# Patient Record
Sex: Female | Born: 1935 | Race: White | Hispanic: No | Marital: Single | Smoking: Never smoker
Health system: Southern US, Community
[De-identification: ages and names within clinical notes are randomized; demographics above are authoritative.]

## PROBLEM LIST (undated history)

## (undated) DIAGNOSIS — I10 Essential (primary) hypertension: Secondary | ICD-10-CM

## (undated) DIAGNOSIS — H3530 Unspecified macular degeneration: Secondary | ICD-10-CM

## (undated) DIAGNOSIS — H35039 Hypertensive retinopathy, unspecified eye: Secondary | ICD-10-CM

## (undated) HISTORY — DX: Unspecified macular degeneration: H35.30

## (undated) HISTORY — PX: YAG LASER APPLICATION: SHX6189

## (undated) HISTORY — PX: CATARACT EXTRACTION: SUR2

## (undated) HISTORY — DX: Essential (primary) hypertension: I10

## (undated) HISTORY — DX: Hypertensive retinopathy, unspecified eye: H35.039

## (undated) HISTORY — PX: EYE SURGERY: SHX253

---

## 1998-03-13 ENCOUNTER — Inpatient Hospital Stay (HOSPITAL_COMMUNITY)
Admission: RE | Admit: 1998-03-13 | Discharge: 1998-03-18 | Payer: Self-pay | Admitting: Physical Medicine and Rehabilitation

## 1998-03-20 ENCOUNTER — Other Ambulatory Visit: Admission: RE | Admit: 1998-03-20 | Discharge: 1998-03-20 | Payer: Self-pay | Admitting: Family Medicine

## 1998-03-27 ENCOUNTER — Other Ambulatory Visit: Admission: RE | Admit: 1998-03-27 | Discharge: 1998-03-27 | Payer: Self-pay | Admitting: Family Medicine

## 1998-04-03 ENCOUNTER — Other Ambulatory Visit: Admission: RE | Admit: 1998-04-03 | Discharge: 1998-04-03 | Payer: Self-pay | Admitting: Family Medicine

## 1998-04-10 ENCOUNTER — Other Ambulatory Visit: Admission: RE | Admit: 1998-04-10 | Discharge: 1998-04-10 | Payer: Self-pay | Admitting: Family Medicine

## 1999-04-15 ENCOUNTER — Other Ambulatory Visit: Admission: RE | Admit: 1999-04-15 | Discharge: 1999-04-15 | Payer: Self-pay | Admitting: Obstetrics and Gynecology

## 1999-04-29 ENCOUNTER — Ambulatory Visit (HOSPITAL_COMMUNITY): Admission: RE | Admit: 1999-04-29 | Discharge: 1999-04-29 | Payer: Self-pay | Admitting: Gastroenterology

## 2001-03-21 ENCOUNTER — Encounter: Payer: Self-pay | Admitting: Orthopedic Surgery

## 2001-03-29 ENCOUNTER — Encounter: Payer: Self-pay | Admitting: Orthopedic Surgery

## 2001-03-29 ENCOUNTER — Inpatient Hospital Stay (HOSPITAL_COMMUNITY): Admission: RE | Admit: 2001-03-29 | Discharge: 2001-03-31 | Payer: Self-pay | Admitting: Orthopedic Surgery

## 2001-03-31 ENCOUNTER — Inpatient Hospital Stay (HOSPITAL_COMMUNITY)
Admission: RE | Admit: 2001-03-31 | Discharge: 2001-04-07 | Payer: Self-pay | Admitting: Physical Medicine & Rehabilitation

## 2001-08-05 ENCOUNTER — Emergency Department (HOSPITAL_COMMUNITY): Admission: EM | Admit: 2001-08-05 | Discharge: 2001-08-05 | Payer: Self-pay | Admitting: Emergency Medicine

## 2001-11-29 ENCOUNTER — Encounter: Admission: RE | Admit: 2001-11-29 | Discharge: 2001-11-29 | Payer: Self-pay | Admitting: Family Medicine

## 2001-11-29 ENCOUNTER — Encounter: Payer: Self-pay | Admitting: Family Medicine

## 2002-09-17 ENCOUNTER — Encounter: Payer: Self-pay | Admitting: Family Medicine

## 2002-09-17 ENCOUNTER — Encounter: Admission: RE | Admit: 2002-09-17 | Discharge: 2002-09-17 | Payer: Self-pay | Admitting: Family Medicine

## 2002-11-30 ENCOUNTER — Encounter: Admission: RE | Admit: 2002-11-30 | Discharge: 2002-11-30 | Payer: Self-pay | Admitting: Obstetrics and Gynecology

## 2002-11-30 ENCOUNTER — Encounter: Payer: Self-pay | Admitting: Family Medicine

## 2003-12-05 ENCOUNTER — Encounter: Admission: RE | Admit: 2003-12-05 | Discharge: 2003-12-05 | Payer: Self-pay | Admitting: Family Medicine

## 2004-02-18 ENCOUNTER — Encounter: Admission: RE | Admit: 2004-02-18 | Discharge: 2004-02-18 | Payer: Self-pay | Admitting: Family Medicine

## 2004-07-31 ENCOUNTER — Ambulatory Visit (HOSPITAL_COMMUNITY): Admission: RE | Admit: 2004-07-31 | Discharge: 2004-07-31 | Payer: Self-pay | Admitting: Gastroenterology

## 2019-04-16 ENCOUNTER — Encounter (INDEPENDENT_AMBULATORY_CARE_PROVIDER_SITE_OTHER): Payer: Self-pay | Admitting: Ophthalmology

## 2019-04-24 NOTE — Progress Notes (Signed)
[facility] - Clinic Note  04/25/2019     CHIEF COMPLAINT Patient presents for Retina Evaluation   HISTORY OF PRESENT ILLNESS: Alexis Cortez is a 85 y.o. female who presents to the clinic today for:   HPI    Retina Evaluation    In both eyes.  This started weeks ago.  Duration of weeks.  Context:  distance vision.  Treatments tried include no treatments.  I, the attending physician,  performed the HPI with the patient and updated documentation appropriately.          Comments    85 y/o female pt referred by [facility] [place], [place] for eval of exu ARMD.  Pt does not recall exactly when she was seen, or by which Doctor, but states it was "a few weeks ago."  Pt reports she does not know why she was originally referred by her comprehensive eye doctor, or why she was referred here.  Feels her DVA is good OU, and only wears glasses for reading.  Denies pain, flashes, floaters.  No gtts.       Last edited by Rennis ChrisZamora, Gladyse Corvin, MD on 04/25/2019  1:37 PM. (History)    pt [place], pt used to see [facility]. Allena KatzPatel at [facility] [place], she cannot remember when her last appt was, but she did she him in February, pt states she is unsure why she was still getting shots from him, she states she does not know if her vision was getting better on injections or not, she states she has had cataract sx OU, but no other eye sx  Referring physician: No referring provider defined for this encounter.  HISTORICAL INFORMATION:   Selected notes from the MEDICAL RECORD NUMBER Referred by [facility], [place], [place] for concern of exu ARMD LEE: 02.06.20 (Shil K. Patel) [BCVA: OD: 20/30+2 OS: 20/25 Ocular Hx-PCO OS, exu ARMD OD, non-exu ARMD OS, s/p YAG cap OD, s/p IVE OD 02.26.20  PMH-HTN, depression, anxiety   CURRENT MEDICATIONS: No current outpatient medications on file. (Ophthalmic Drugs)   No current facility-administered  medications for this visit.  (Ophthalmic Drugs)   Current Outpatient Medications (Other)  Medication Sig  . busPIRone (BUSPAR) 15 MG tablet Take 15 mg by mouth 2 (two) times daily.  . busPIRone (BUSPAR) 7.5 MG tablet Take by mouth.  . clonazePAM (KLONOPIN) 1 MG tablet Take 1 mg by mouth 2 (two) times daily.  . [employer] PURELAX 17 GM/SCOOP powder TAKE 17G BY MOUTH DAILY FOR 3 DAYS.  Marland Kitchen. cyanocobalamin (,VITAMIN B-12,) 1000 MCG/ML injection INJECT 1 ML TWICE A MONTH  . docusate sodium (COLACE) 100 MG capsule Take by mouth.  . esomeprazole (NEXIUM) 40 MG capsule Take by mouth.  . gabapentin (NEURONTIN) 100 MG capsule 1 CAPSULE(S) THREE TIMES A DAY  . levothyroxine (SYNTHROID) 50 MCG tablet Take by mouth.  Marland Kitchen. lisinopril (ZESTRIL) 10 MG tablet Take by mouth.  Marland Kitchen. LORazepam (ATIVAN) 0.5 MG tablet TAKE 1 TABLET BY MOUTH 30 MIN PRIOR TO MRI MAY REPEAT IN 15 MINUTES IF NEEDED  . mirtazapine (REMERON) 15 MG tablet Take by mouth.  . Misc. Devices MISC Knee high compression stockings 18-1131mmHg pressure to be worn during daytime hours and off at night Dx: peripheral edema  . Multiple Vitamin tablet Take by mouth.  . mupirocin ointment (BACTROBAN) 2 % APPLY TO AFFECTED AREA 3 TIMES A DAY  . oxyCODONE-acetaminophen (PERCOCET/ROXICET) 5-325  MG tablet Take 1 tablet by mouth every 4 (four) hours as needed. for pain  . predniSONE (DELTASONE) 50 MG tablet TAKE 1 TABLET BY MOUTH EVERY DAY FOR 5 DAYS  . pregabalin (LYRICA) 50 MG capsule Take by mouth.  . Skin Protectants, Misc. (EUCERIN) cream Apply topically.  . sulfamethoxazole-trimethoprim (BACTRIM DS) 800-160 MG tablet Take 1 tablet by mouth 2 (two) times daily. for 10 days  . traZODone (DESYREL) 100 MG tablet Take by mouth.   No current facility-administered medications for this visit.  (Other)      REVIEW OF SYSTEMS: ROS    Positive for: Eyes   Negative for: Constitutional, Gastrointestinal, Neurological, Skin, Genitourinary, Musculoskeletal, HENT,  Endocrine, Cardiovascular, Respiratory, Psychiatric, Allergic/Imm, Heme/Lymph   Last edited by Celine MansBaxley, Andrew G, COA on 04/25/2019  1:13 PM. (History)       ALLERGIES No Known Allergies  PAST MEDICAL HISTORY Past Medical History:  Diagnosis Date  . Hypertension    History reviewed. No pertinent surgical history.  FAMILY HISTORY History reviewed. No pertinent family history.  SOCIAL HISTORY Social History   Tobacco Use  . Smoking status: Not on file  Substance Use Topics  . Alcohol use: Not on file  . Drug use: Not on file         OPHTHALMIC EXAM:  Base Eye Exam    Visual Acuity (Snellen - Linear)      Right Left   Dist [place] 20/50 -2 20/40 -2   Dist ph [place] 20/40 -2 NI       Tonometry (Tonopen, 1:16 PM)      Right Left   Pressure 14 16       Pupils      Dark Light Shape React APD   Right 3 2 Round Brisk None   Left 3 2 Round Brisk None       Visual Fields (Counting fingers)      Left Right    Full Full       Extraocular Movement      Right Left    Full, Ortho Full, Ortho       Neuro/Psych    Oriented x3: Yes   Mood/Affect: Normal       Dilation    Both eyes: 1.0% Mydriacyl, 2.5% Phenylephrine @ 1:16 PM        Slit Lamp and Fundus Exam    Slit Lamp Exam      Right Left   Lids/Lashes Dermatochalasis - upper lid, mild Ptosis, mild Meibomian gland dysfunction Dermatochalasis - upper lid, mild Ptosis, mild Meibomian gland dysfunction   Conjunctiva/Sclera White and quiet mild temporal Pinguecula   Cornea Mild Arcus, trace Punctate epithelial erosions Mild Arcus, trace Punctate epithelial erosions   Anterior Chamber Deep and quiet Deep and quiet   Iris Round and dilated Round and dilated   Lens Posterior chamber intraocular lens Posterior chamber intraocular lens, 1+Posterior capsular opacification superiorly   Vitreous Vitreous syneresis Vitreous syneresis       Fundus Exam      Right Left   Disc Mild Pallor, Sharp rim, Peripapillary atrophy  Mild Pallor, Sharp rim, Peripapillary atrophy   C/D Ratio 0.4 0.4   Macula Blunted foveal reflex, central PED/CNVM, Drusen, RPE mottling, clumping and atrophy, trace cystic changes Flat, Blunted foveal reflex, Drusen, RPE mottling and clumping, No heme or edema   Vessels Vascular attenuation Vascular attenuation   Periphery Attached, No heme  Attached  Refraction    Manifest Refraction      Sphere Cylinder Axis Dist VA   Right [place] Sphere  20/50-2   Left -1.25 +0.25 165 20/40+2          IMAGING AND PROCEDURES  Imaging and Procedures for @TODAY @  OCT, Retina - OU - Both Eyes       Right Eye Quality was good. Central Foveal Thickness: 404. Progression has no prior data. Findings include abnormal foveal contour, outer retinal tubulation, intraretinal fluid, subretinal hyper-reflective material, pigment epithelial detachment, outer retinal atrophy, no SRF, retinal drusen .   Left Eye Quality was good. Central Foveal Thickness: 263. Progression has no prior data. Findings include normal foveal contour, no IRF, retinal drusen , no SRF, outer retinal atrophy (Trace ERM).   Notes *Images captured and stored on drive  Diagnosis / Impression:  exu ARMD - trace IRF overlying PED OD Non-exu ARMD OS  Clinical management:  See below  Abbreviations: NFP - Normal foveal profile. CME - cystoid macular edema. PED - pigment epithelial detachment. IRF - intraretinal fluid. SRF - subretinal fluid. EZ - ellipsoid zone. ERM - epiretinal membrane. ORA - outer retinal atrophy. ORT - outer retinal tubulation. SRHM - subretinal hyper-reflective material        Intravitreal Injection, Pharmacologic Agent - OD - Right Eye       Time Out 04/25/2019. 2:05 PM. Confirmed correct patient, procedure, site, and patient consented.   Anesthesia Topical anesthesia was used. Anesthetic medications included Lidocaine 2%, Proparacaine 0.5%.   Procedure Preparation included 5% betadine to  ocular surface, eyelid speculum. A 30 gauge needle was used.   Injection:  2 mg aflibercept Gretta Cool(EYLEA) SOLN   NDC: [id number], Lot: [id number], Expiration date: 03/16/2020   Route: Intravitreal, Site: Right Eye, Waste: 0.05 mL  Post-op Post injection exam found visual acuity of at least counting fingers. The patient tolerated the procedure well. There were no complications. The patient received written and verbal post procedure care education.                 ASSESSMENT/PLAN:    ICD-10-CM   1. Exudative age-related macular degeneration of right eye with active choroidal neovascularization (HCC)  H35.3211 Intravitreal Injection, Pharmacologic Agent - OD - Right Eye    aflibercept (EYLEA) SOLN 2 mg  2. Retinal edema  H35.81 OCT, Retina - OU - Both Eyes  3. Intermediate stage nonexudative age-related macular degeneration of left eye  H35.3122   4. Essential hypertension  I10   5. Hypertensive retinopathy of both eyes  H35.033   6. Pseudophakia of both eyes  Z96.1   7. Left posterior capsular opacification  H26.492     1,2. Exudative age related macular degeneration, right eye  - previous pt of [facility]. Caroline MoreShil Patel at [facility] in [place], [place] (last injection was IVE on 02.26.20, and was on a 7-8 injection interval) -- [place]  - The incidence pathology and anatomy of wet AMD discussed   - The ANCHOR, MARINA, CATT and VIEW trials discussed with patient.    - discussed treatment options including observation vs intravitreal anti-VEGF agents such as Avastin, Lucentis, Eylea.    - Risks of endophthalmitis and vascular occlusive events and atrophic changes discussed with patient  - OCT shows trace IRF overlying PED OD  - recommend IVE OD #1 today,  - pt wishes to proceed  - RBA of procedure discussed, questions answered  - informed consent obtained  and signed  - Eylea4U benefits investigation started on April 25, 2019 -- approved as of  today  - see procedure note  - f/u in 6 wks -- DFE/OCT/possible injection  3. Age related macular degeneration, non-exudative, both eyes  - The incidence, anatomy, and pathology of dry AMD, risk of progression, and the AREDS and AREDS 2 study including smoking risks discussed with patient.  - Recommend amsler grid monitoring  - f/u 3 months  4,5. Hypertensive retinopathy OU  - discussed importance of tight BP control  - monitor  5,6. Pseudophakia OU  - s/p CE/IOL OU  - s/p YAG cap OD  - beautiful surgeries, doing well  - OS with mild noncentral PCO  - monitor   Ophthalmic Meds Ordered this visit:  Meds ordered this encounter  Medications  . aflibercept (EYLEA) SOLN 2 mg       Return in about 6 weeks (around 06/06/2019) for f/u exu ARMD OD, DFE, OCT.  There are no Patient Instructions on file for this visit.   Explained the diagnoses, plan, and follow up with the patient and they expressed understanding.  Patient expressed understanding of the importance of proper follow up care.   This document serves as a record of services personally performed by Gardiner Sleeper, MD, PhD. It was created on their behalf by Ernest Mallick, OA, an ophthalmic assistant. The creation of this record is the provider's dictation and/or activities during the visit.    Electronically signed by: Ernest Mallick, OA  07.07.2020 4:36 PM    Gardiner Sleeper, M.D., Ph.D. Diseases & Surgery of the Retina and Vitreous [facility]   I have reviewed the above documentation for accuracy and completeness, and I agree with the above. Gardiner Sleeper, M.D., Ph.D. 04/25/19 4:39 PM     Abbreviations: M myopia (nearsighted); A astigmatism; H hyperopia (farsighted); P presbyopia; Mrx spectacle prescription;  CTL contact lenses; OD right eye; OS left eye; OU both eyes  XT exotropia; ET esotropia; PEK punctate epithelial keratitis; PEE punctate epithelial erosions; DES dry eye syndrome; MGD  meibomian gland dysfunction; ATs artificial tears; PFAT's preservative free artificial tears; [place] nuclear sclerotic cataract; PSC posterior subcapsular cataract; ERM epi-retinal membrane; PVD posterior vitreous detachment; RD retinal detachment; DM diabetes mellitus; DR diabetic retinopathy; NPDR non-proliferative diabetic retinopathy; PDR proliferative diabetic retinopathy; CSME clinically significant macular edema; DME diabetic macular edema; dbh dot blot hemorrhages; CWS cotton wool spot; POAG primary open angle glaucoma; C/D cup-to-disc ratio; HVF humphrey visual field; GVF goldmann visual field; OCT optical coherence tomography; IOP intraocular pressure; BRVO Branch retinal vein occlusion; CRVO central retinal vein occlusion; CRAO central retinal artery occlusion; BRAO branch retinal artery occlusion; RT retinal tear; SB scleral buckle; PPV pars plana vitrectomy; VH Vitreous hemorrhage; PRP panretinal laser photocoagulation; IVK intravitreal kenalog; VMT vitreomacular traction; MH Macular hole;  NVD neovascularization of the disc; NVE neovascularization elsewhere; AREDS age related eye disease study; ARMD age related macular degeneration; POAG primary open angle glaucoma; EBMD epithelial/anterior basement membrane dystrophy; ACIOL anterior chamber intraocular lens; IOL intraocular lens; PCIOL posterior chamber intraocular lens; Phaco/IOL phacoemulsification with intraocular lens placement; [place] photorefractive keratectomy; LASIK laser assisted in situ keratomileusis; HTN hypertension; DM diabetes mellitus; COPD chronic obstructive pulmonary disease

## 2019-04-25 ENCOUNTER — Encounter (INDEPENDENT_AMBULATORY_CARE_PROVIDER_SITE_OTHER): Payer: Self-pay | Admitting: Ophthalmology

## 2019-04-25 ENCOUNTER — Other Ambulatory Visit: Payer: Self-pay

## 2019-04-25 ENCOUNTER — Ambulatory Visit (INDEPENDENT_AMBULATORY_CARE_PROVIDER_SITE_OTHER): Payer: Medicare Other | Admitting: Ophthalmology

## 2019-04-25 DIAGNOSIS — H3581 Retinal edema: Secondary | ICD-10-CM

## 2019-04-25 DIAGNOSIS — H35033 Hypertensive retinopathy, bilateral: Secondary | ICD-10-CM

## 2019-04-25 DIAGNOSIS — Z961 Presence of intraocular lens: Secondary | ICD-10-CM

## 2019-04-25 DIAGNOSIS — I10 Essential (primary) hypertension: Secondary | ICD-10-CM

## 2019-04-25 DIAGNOSIS — H353211 Exudative age-related macular degeneration, right eye, with active choroidal neovascularization: Secondary | ICD-10-CM | POA: Diagnosis not present

## 2019-04-25 DIAGNOSIS — H353122 Nonexudative age-related macular degeneration, left eye, intermediate dry stage: Secondary | ICD-10-CM

## 2019-04-25 DIAGNOSIS — H26492 Other secondary cataract, left eye: Secondary | ICD-10-CM

## 2019-04-25 MED ORDER — AFLIBERCEPT 2MG/0.05ML IZ SOLN FOR KALEIDOSCOPE
2.0000 mg | INTRAVITREAL | Status: AC | PRN
  Administered 2019-04-25: 2 mg via INTRAVITREAL

## 2019-06-05 NOTE — Progress Notes (Signed)
[facility] Note  06/06/2019     CHIEF COMPLAINT Patient presents for Retina Follow Up   HISTORY OF PRESENT ILLNESS: Alexis Cortez is a 85 y.o. female who presents to the clinic today for:   HPI    Retina Follow Up    Patient presents with  Wet AMD.  In right eye.  This started 6 weeks ago.  Severity is moderate.  Duration of 6 weeks.  Since onset it is stable.  I, the attending physician,  performed the HPI with the patient and updated documentation appropriately.          Comments    85 y/o female pt here for 6 wk f/u for wet ARMD OD.  No change in [place] OU.  Denies pain, flashes, floaters.  No gtts.       Last edited by Bernarda Caffey, MD on 06/06/2019  2:56 PM. (History)    pt states she has not noticed any change in her vision, she did not have problems with the injection last time  Referring physician: No referring provider defined for this encounter.  HISTORICAL INFORMATION:   Selected notes from the MEDICAL RECORD NUMBER Referred by [place], [place] for concern of exu ARMD LEE: 02.06.20 (Alexis Cortez) [BCVA: OD: 20/30+2 OS: 20/25 Ocular Hx-PCO OS, exu ARMD OD, non-exu ARMD OS, s/p YAG cap OD, s/p IVE OD 02.26.20  PMH-HTN, depression, anxiety   CURRENT MEDICATIONS: No current outpatient medications on file. (Ophthalmic Drugs)   No current facility-administered medications for this visit.  (Ophthalmic Drugs)   Current Outpatient Medications (Other)  Medication Sig  . busPIRone (BUSPAR) 15 MG tablet Take 15 mg by mouth 2 (two) times daily.  . clonazePAM (KLONOPIN) 1 MG tablet Take 1 mg by mouth 2 (two) times daily.  . [employer] PURELAX 17 GM/SCOOP powder TAKE 17G BY MOUTH DAILY FOR 3 DAYS.  Marland Kitchen cyanocobalamin (,VITAMIN B-12,) 1000 MCG/ML injection INJECT 1 ML TWICE A MONTH  . docusate sodium (COLACE) 100 MG capsule Take by mouth.  . esomeprazole (NEXIUM) 40 MG capsule Take by mouth.  . gabapentin (NEURONTIN) 100 MG capsule 1  CAPSULE(S) THREE TIMES A DAY  . levothyroxine (SYNTHROID) 50 MCG tablet Take by mouth.  Marland Kitchen lisinopril (ZESTRIL) 10 MG tablet Take by mouth.  Marland Kitchen LORazepam (ATIVAN) 0.5 MG tablet TAKE 1 TABLET BY MOUTH 30 MIN PRIOR TO MRI MAY REPEAT IN 15 MINUTES IF NEEDED  . mirtazapine (REMERON) 15 MG tablet Take by mouth.  . Misc. Devices MISC Knee high compression stockings 18-25mmHg pressure to be worn during daytime hours and off at night Dx: peripheral edema  . Multiple Vitamin tablet Take by mouth.  . mupirocin ointment (BACTROBAN) 2 % APPLY TO AFFECTED AREA 3 TIMES A DAY  . oxyCODONE-acetaminophen (PERCOCET/ROXICET) 5-325 MG tablet Take 1 tablet by mouth every 4 (four) hours as needed. for pain  . predniSONE (DELTASONE) 50 MG tablet TAKE 1 TABLET BY MOUTH EVERY DAY FOR 5 DAYS  . pregabalin (LYRICA) 100 MG capsule TAKE 1 CAPSULE BY MOUTH THREE TIMES A DAY  . Skin Protectants, Misc. (EUCERIN) cream Apply topically.  . sulfamethoxazole-trimethoprim (BACTRIM DS) 800-160 MG tablet Take 1 tablet by mouth 2 (two) times daily. for 10 days  . traZODone (DESYREL) 100 MG tablet Take by mouth.  . busPIRone (BUSPAR) 7.5 MG tablet Take by mouth.  . pregabalin (LYRICA) 50 MG capsule Take by mouth.   No current facility-administered medications for this  visit.  (Other)      REVIEW OF SYSTEMS: ROS    Positive for: Eyes   Negative for: Constitutional, Gastrointestinal, Neurological, Skin, Genitourinary, Musculoskeletal, HENT, Endocrine, Cardiovascular, Respiratory, Psychiatric, Allergic/Imm, Heme/Lymph   Last edited by Celine MansBaxley, Andrew G, COA on 06/06/2019  1:14 PM. (History)       ALLERGIES No Known Allergies  PAST MEDICAL HISTORY Past Medical History:  Diagnosis Date  . Hypertension   . Hypertensive retinopathy    OU  . Macular degeneration    Wet OD, Dry OS   Past Surgical History:  Procedure Laterality Date  . CATARACT EXTRACTION Bilateral   . EYE SURGERY    . YAG LASER APPLICATION Right      FAMILY HISTORY History reviewed. No pertinent family history.  SOCIAL HISTORY Social History   Tobacco Use  . Smoking status: Not on file  Substance Use Topics  . Alcohol use: Not on file  . Drug use: Not on file         OPHTHALMIC EXAM:  Base Eye Exam    Visual Acuity (Snellen - Linear)      Right Left   Dist [place] 20/50 -2 20/40 -2   Dist ph [place] 20/30 -2 NI       Tonometry (Tonopen, 1:17 PM)      Right Left   Pressure 15 15       Pupils      Dark Light Shape React APD   Right 3 2 Round Brisk None   Left 3 2 Round Brisk None       Visual Fields (Counting fingers)      Left Right    Full Full       Extraocular Movement      Right Left    Full, Ortho Full, Ortho       Neuro/Psych    Oriented x3: Yes   Mood/Affect: Normal       Dilation    Both eyes: 1.0% Mydriacyl, 2.5% Phenylephrine @ 1:17 PM        Slit Lamp and Fundus Exam    Slit Lamp Exam      Right Left   Lids/Lashes Dermatochalasis - upper lid, mild Ptosis, mild Meibomian gland dysfunction Dermatochalasis - upper lid, mild Ptosis, mild Meibomian gland dysfunction   Conjunctiva/Sclera White and quiet mild temporal Pinguecula   Cornea Mild Arcus, trace Punctate epithelial erosions Mild Arcus, trace Punctate epithelial erosions   Anterior Chamber Deep and quiet Deep and quiet   Iris Round and dilated Round and dilated   Lens Posterior chamber intraocular lens Posterior chamber intraocular lens, 1+Posterior capsular opacification superiorly   Vitreous Vitreous syneresis, Posterior vitreous detachment Vitreous syneresis       Fundus Exam      Right Left   Disc Mild Pallor, Sharp rim, Peripapillary atrophy Mild Pallor, Sharp rim, Peripapillary atrophy   C/D Ratio 0.4 0.4   Macula Blunted foveal reflex, central PED/CNVM, Drusen, RPE mottling, clumping and atrophy, trace cystic changes Flat, Blunted foveal reflex, Drusen, RPE mottling and clumping, No heme or edema   Vessels Vascular  attenuation Vascular attenuation   Periphery Attached, No heme  Attached             IMAGING AND PROCEDURES  Imaging and Procedures for @TODAY @  OCT, Retina - OU - Both Eyes       Right Eye Quality was good. Central Foveal Thickness: 381. Progression has improved. Findings include abnormal foveal contour, outer retinal tubulation,  intraretinal fluid, subretinal hyper-reflective material, pigment epithelial detachment, outer retinal atrophy, no SRF, retinal drusen  (Interval improvement in cystic changes, IRF and central edema).   Left Eye Quality was good. Central Foveal Thickness: 265. Progression has been stable. Findings include normal foveal contour, no IRF, retinal drusen , no SRF, outer retinal atrophy (Trace ERM).   Notes *Images captured and stored on drive  Diagnosis / Impression:  exu ARMD - trace IRF overlying PED OD; Interval improvement in cystic changes, IRF and central edema  Non-exu ARMD OS  Clinical management:  See below  Abbreviations: NFP - Normal foveal profile. CME - cystoid macular edema. PED - pigment epithelial detachment. IRF - intraretinal fluid. SRF - subretinal fluid. EZ - ellipsoid zone. ERM - epiretinal membrane. ORA - outer retinal atrophy. ORT - outer retinal tubulation. SRHM - subretinal hyper-reflective material        Intravitreal Injection, Pharmacologic Agent - OD - Right Eye       Time Out 06/06/2019. 1:06 PM. Confirmed correct patient, procedure, site, and patient consented.   Anesthesia Topical anesthesia was used. Anesthetic medications included Lidocaine 2%, Proparacaine 0.5%.   Procedure Preparation included 5% betadine to ocular surface, eyelid speculum. A 30 gauge needle was used.   Injection:  2 mg aflibercept Gretta Cool(EYLEA) SOLN   NDC: [id number], Lot: [id number], Expiration date: 12/08/2019   Route: Intravitreal, Site: Right Eye, Waste: 0.05 mL  Post-op Post injection exam found visual acuity of at least counting fingers.  The patient tolerated the procedure well. There were no complications. The patient received written and verbal post procedure care education.                 ASSESSMENT/PLAN:    ICD-10-CM   1. Exudative age-related macular degeneration of right eye with active choroidal neovascularization (HCC)  H35.3211 Intravitreal Injection, Pharmacologic Agent - OD - Right Eye    aflibercept (EYLEA) SOLN 2 mg  2. Retinal edema  H35.81 OCT, Retina - OU - Both Eyes  3. Intermediate stage nonexudative age-related macular degeneration of left eye  H35.3122   4. Essential hypertension  I10   5. Hypertensive retinopathy of both eyes  H35.033   6. Pseudophakia of both eyes  Z96.1   7. Left posterior capsular opacification  H26.492     1,2. Exudative age related macular degeneration, right eye  - previous pt of Dr. Caroline MoreShil Cortez at [facility] in [place] (last injection was IVE on 02.26.20, and was on a 7-8 injection interval) -- [place] in [place] temporarily with family  - The incidence pathology and anatomy of wet AMD discussed   - The ANCHOR, MARINA, CATT and VIEW trials discussed with patient.    - discussed treatment options including observation vs intravitreal anti-VEGF agents such as Avastin, Lucentis, Eylea.    - Risks of endophthalmitis and vascular occlusive events and atrophic changes discussed with patient  - s/p IVA #1 (07.08.20)  - OCT shows interval improvement in trace IRF overlying PED OD  - BCVA improved to 20/30-2 from 20/40-2  - recommend IVA #2 today (08.19.20) w/ extension to 8 wks ([facility])  - pt wishes to proceed  - RBA of procedure discussed, questions answered  - informed consent obtained and signed  - see procedure note  - Eylea4U benefits investigation started on April 25, 2019 -- approved as of 7.8.2020  - f/u in 8 wks -- DFE/OCT/possible injection  3. Age related macular degeneration, non-exudative, both  eyes  - The  incidence, anatomy, and pathology of dry AMD, risk of progression, and the AREDS and AREDS 2 study including smoking risks discussed with patient.  - Recommend amsler grid monitoring  - f/u 3 months  4,5. Hypertensive retinopathy OU  - discussed importance of tight BP control  - monitor  5,6. Pseudophakia OU  - s/p CE/IOL OU  - s/p YAG cap OD  - beautiful surgeries, doing well  - OS with mild noncentral PCO  - monitor   Ophthalmic Meds Ordered this visit:  Meds ordered this encounter  Medications  . aflibercept (EYLEA) SOLN 2 mg       Return in about 8 weeks (around 08/01/2019) for f/u exu ARMD OD, DFE, OCT.  There are no Patient Instructions on file for this visit.   Explained the diagnoses, plan, and follow up with the patient and they expressed understanding.  Patient expressed understanding of the importance of proper follow up care.   This document serves as a record of services personally performed by Karie ChimeraBrian G. Zamora, MD, PhD. It was created on their behalf by Annalee Gentaaryl Barber, COMT. The creation of this record is the provider's dictation and/or activities during the visit.  Electronically signed by: Annalee Gentaaryl Barber, COMT 06/06/19 2:59 PM    Karie ChimeraBrian G. Zamora, M.D., Ph.D. Diseases & Surgery of the Retina and Vitreous [facility]  I have reviewed the above documentation for accuracy and completeness, and I agree with the above. Karie ChimeraBrian G. Zamora, M.D., Ph.D. 06/06/19 2:59 PM   Abbreviations: M myopia (nearsighted); A astigmatism; H hyperopia (farsighted); P presbyopia; Mrx spectacle prescription;  CTL contact lenses; OD right eye; OS left eye; OU both eyes  XT exotropia; ET esotropia; PEK punctate epithelial keratitis; PEE punctate epithelial erosions; DES dry eye syndrome; MGD meibomian gland dysfunction; ATs artificial tears; PFAT's preservative free artificial tears; NSC nuclear sclerotic cataract; PSC posterior subcapsular cataract; ERM epi-retinal  membrane; PVD posterior vitreous detachment; RD retinal detachment; DM diabetes mellitus; DR diabetic retinopathy; NPDR non-proliferative diabetic retinopathy; PDR proliferative diabetic retinopathy; CSME clinically significant macular edema; DME diabetic macular edema; dbh dot blot hemorrhages; CWS cotton wool spot; POAG primary open angle glaucoma; C/D cup-to-disc ratio; HVF humphrey visual field; GVF goldmann visual field; OCT optical coherence tomography; IOP intraocular pressure; BRVO Branch retinal vein occlusion; CRVO central retinal vein occlusion; CRAO central retinal artery occlusion; BRAO branch retinal artery occlusion; RT retinal tear; SB scleral buckle; PPV pars plana vitrectomy; VH Vitreous hemorrhage; PRP panretinal laser photocoagulation; IVK intravitreal kenalog; VMT vitreomacular traction; MH Macular hole;  NVD neovascularization of the disc; NVE neovascularization elsewhere; AREDS age related eye disease study; ARMD age related macular degeneration; POAG primary open angle glaucoma; EBMD epithelial/anterior basement membrane dystrophy; ACIOL anterior chamber intraocular lens; IOL intraocular lens; PCIOL posterior chamber intraocular lens; Phaco/IOL phacoemulsification with intraocular lens placement; PRK photorefractive keratectomy; LASIK laser assisted in situ keratomileusis; HTN hypertension; DM diabetes mellitus; COPD chronic obstructive pulmonary disease

## 2019-06-06 ENCOUNTER — Ambulatory Visit (INDEPENDENT_AMBULATORY_CARE_PROVIDER_SITE_OTHER): Payer: Medicare Other | Admitting: Ophthalmology

## 2019-06-06 ENCOUNTER — Other Ambulatory Visit: Payer: Self-pay

## 2019-06-06 ENCOUNTER — Encounter (INDEPENDENT_AMBULATORY_CARE_PROVIDER_SITE_OTHER): Payer: Self-pay | Admitting: Ophthalmology

## 2019-06-06 DIAGNOSIS — Z961 Presence of intraocular lens: Secondary | ICD-10-CM

## 2019-06-06 DIAGNOSIS — H3581 Retinal edema: Secondary | ICD-10-CM

## 2019-06-06 DIAGNOSIS — H353211 Exudative age-related macular degeneration, right eye, with active choroidal neovascularization: Secondary | ICD-10-CM

## 2019-06-06 DIAGNOSIS — H353122 Nonexudative age-related macular degeneration, left eye, intermediate dry stage: Secondary | ICD-10-CM | POA: Diagnosis not present

## 2019-06-06 DIAGNOSIS — H35033 Hypertensive retinopathy, bilateral: Secondary | ICD-10-CM

## 2019-06-06 DIAGNOSIS — I10 Essential (primary) hypertension: Secondary | ICD-10-CM

## 2019-06-06 DIAGNOSIS — H26492 Other secondary cataract, left eye: Secondary | ICD-10-CM

## 2019-06-06 MED ORDER — AFLIBERCEPT 2MG/0.05ML IZ SOLN FOR KALEIDOSCOPE
2.0000 mg | INTRAVITREAL | Status: AC | PRN
  Administered 2019-06-06: 2 mg via INTRAVITREAL

## 2019-07-30 NOTE — Progress Notes (Signed)
[facility] - Clinic Note  08/01/2019     CHIEF COMPLAINT Patient presents for Retina Follow Up   HISTORY OF PRESENT ILLNESS: Alexis Cortez is a 85 y.o. female who presents to the clinic today for:   HPI    Retina Follow Up    Patient presents with  Dry AMD.  In right eye.  This started 3.  Since onset it is stable.  I, the attending physician,  performed the HPI with the patient and updated documentation appropriately.          Comments    F/U EXU AMD OD. Patient states "my vision is good", denies new visual onsets/issues. Patient ready for tx today if indicted.       Last edited by Rennis ChrisZamora, Charina Fons, MD on 08/01/2019  2:38 PM. (History)    pt states her vision is about the same as last time   Referring physician: No referring provider defined for this encounter.  HISTORICAL INFORMATION:   Selected notes from the MEDICAL RECORD NUMBER Referred by [facility], [place] concern of exu ARMD LEE: 02.06.20 (Shil K. Patel) [BCVA: OD: 20/30+2 OS: 20/25 Ocular Hx-PCO OS, exu ARMD OD, non-exu ARMD OS, s/p YAG cap OD, s/p IVE OD 02.26.20  PMH-HTN, depression, anxiety   CURRENT MEDICATIONS: No current outpatient medications on file. (Ophthalmic Drugs)   No current facility-administered medications for this visit.  (Ophthalmic Drugs)   Current Outpatient Medications (Other)  Medication Sig  . busPIRone (BUSPAR) 15 MG tablet Take 15 mg by mouth 2 (two) times daily.  . busPIRone (BUSPAR) 7.5 MG tablet Take by mouth.  . clonazePAM (KLONOPIN) 1 MG tablet Take 1 mg by mouth 2 (two) times daily.  . [employer] PURELAX 17 GM/SCOOP powder TAKE 17G BY MOUTH DAILY FOR 3 DAYS.  Marland Kitchen. cyanocobalamin (,VITAMIN B-12,) 1000 MCG/ML injection INJECT 1 ML TWICE A MONTH  . docusate sodium (COLACE) 100 MG capsule Take by mouth.  . esomeprazole (NEXIUM) 40 MG capsule Take by mouth.  . gabapentin (NEURONTIN) 100 MG capsule 1 CAPSULE(S) THREE TIMES A DAY  . levothyroxine  (SYNTHROID) 50 MCG tablet Take by mouth.  Marland Kitchen. lisinopril (ZESTRIL) 10 MG tablet Take by mouth.  Marland Kitchen. LORazepam (ATIVAN) 0.5 MG tablet TAKE 1 TABLET BY MOUTH 30 MIN PRIOR TO MRI MAY REPEAT IN 15 MINUTES IF NEEDED  . mirtazapine (REMERON) 15 MG tablet Take by mouth.  . Misc. Devices MISC Knee high compression stockings 18-1631mmHg pressure to be worn during daytime hours and off at night Dx: peripheral edema  . Multiple Vitamin tablet Take by mouth.  . mupirocin ointment (BACTROBAN) 2 % APPLY TO AFFECTED AREA 3 TIMES A DAY  . oxyCODONE-acetaminophen (PERCOCET/ROXICET) 5-325 MG tablet Take 1 tablet by mouth every 4 (four) hours as needed. for pain  . predniSONE (DELTASONE) 50 MG tablet TAKE 1 TABLET BY MOUTH EVERY DAY FOR 5 DAYS  . pregabalin (LYRICA) 100 MG capsule TAKE 1 CAPSULE BY MOUTH THREE TIMES A DAY  . pregabalin (LYRICA) 50 MG capsule Take by mouth.  . Skin Protectants, Misc. (EUCERIN) cream Apply topically.  . sulfamethoxazole-trimethoprim (BACTRIM DS) 800-160 MG tablet Take 1 tablet by mouth 2 (two) times daily. for 10 days  . traZODone (DESYREL) 100 MG tablet Take by mouth.   No current facility-administered medications for this visit.  (Other)      REVIEW OF SYSTEMS: ROS    Positive for: Eyes   Negative for: Constitutional, Gastrointestinal, Neurological, Skin,  Genitourinary, Musculoskeletal, HENT, Endocrine, Cardiovascular, Respiratory, Psychiatric, Allergic/Imm, Heme/Lymph   Last edited by Zenovia Jordan, LPN on 16/04/3709  6:26 PM. (History)       ALLERGIES No Known Allergies  PAST MEDICAL HISTORY Past Medical History:  Diagnosis Date  . Hypertension   . Hypertensive retinopathy    OU  . Macular degeneration    Wet OD, Dry OS   Past Surgical History:  Procedure Laterality Date  . CATARACT EXTRACTION Bilateral   . EYE SURGERY    . YAG LASER APPLICATION Right     FAMILY HISTORY History reviewed. No pertinent family history.  SOCIAL HISTORY Social History    Tobacco Use  . Smoking status: Never Smoker  . Smokeless tobacco: Never Used  Substance Use Topics  . Alcohol use: Not on file  . Drug use: Not on file         OPHTHALMIC EXAM:  Base Eye Exam    Visual Acuity (Snellen - Linear)      Right Left   Dist [place] 20/40 +1 20/40 +2   Dist ph [place] NI 20/30 -1       Tonometry (Tonopen, 1:57 PM)      Right Left   Pressure 12 11       Pupils      Dark Light Shape React APD   Right 3 2 Round Brisk None   Left 3 2 Round Brisk None       Visual Fields (Counting fingers)      Left Right    Full Full       Extraocular Movement      Right Left    Full, Ortho Full, Ortho       Neuro/Psych    Oriented x3: Yes   Mood/Affect: Normal       Dilation    Both eyes: 1.0% Mydriacyl, 2.5% Phenylephrine @ 1:57 PM        Slit Lamp and Fundus Exam    Slit Lamp Exam      Right Left   Lids/Lashes Dermatochalasis - upper lid, mild Ptosis, mild Meibomian gland dysfunction Dermatochalasis - upper lid, mild Ptosis, mild Meibomian gland dysfunction   Conjunctiva/Sclera White and quiet mild temporal Pinguecula   Cornea Mild Arcus, trace Punctate epithelial erosions Mild Arcus, trace Punctate epithelial erosions   Anterior Chamber Deep and quiet Deep and quiet   Iris Round and dilated Round and dilated   Lens Posterior chamber intraocular lens Posterior chamber intraocular lens, 1+Posterior capsular opacification superiorly   Vitreous Vitreous syneresis, Posterior vitreous detachment Vitreous syneresis       Fundus Exam      Right Left   Disc Mild Pallor, Sharp rim, Peripapillary atrophy Mild Pallor, Sharp rim, Peripapillary atrophy   C/D Ratio 0.4 0.4   Macula Blunted foveal reflex, central PED/CNVM, Drusen, RPE mottling, clumping and atrophy, trace cystic changes Flat, Blunted foveal reflex, Drusen, RPE mottling and clumping, No heme or edema   Vessels Vascular attenuation Vascular attenuation   Periphery Attached, No heme  Attached, No  heme           IMAGING AND PROCEDURES  Imaging and Procedures for @TODAY @  OCT, Retina - OU - Both Eyes       Right Eye Quality was good. Central Foveal Thickness: 383. Progression has been stable. Findings include abnormal foveal contour, outer retinal tubulation, intraretinal fluid, subretinal hyper-reflective material, pigment epithelial detachment, outer retinal atrophy, no SRF, retinal drusen  (Persistent cystic changes).  Left Eye Quality was good. Central Foveal Thickness: 273. Progression has been stable. Findings include normal foveal contour, no IRF, retinal drusen , no SRF, outer retinal atrophy (Trace ERM).   Notes *Images captured and stored on drive  Diagnosis / Impression:  exu ARMD - trace IRF overlying PED OD; persistent cystic changes Non-exu ARMD OS  Clinical management:  See below  Abbreviations: NFP - Normal foveal profile. CME - cystoid macular edema. PED - pigment epithelial detachment. IRF - intraretinal fluid. SRF - subretinal fluid. EZ - ellipsoid zone. ERM - epiretinal membrane. ORA - outer retinal atrophy. ORT - outer retinal tubulation. SRHM - subretinal hyper-reflective material        Intravitreal Injection, Pharmacologic Agent - OD - Right Eye       Time Out 08/01/2019. 2:01 PM. Confirmed correct patient, procedure, site, and patient consented.   Anesthesia Topical anesthesia was used. Anesthetic medications included Lidocaine 2%, Proparacaine 0.5%.   Procedure Preparation included 5% betadine to ocular surface. A 30 gauge needle was used.   Injection:  2 mg aflibercept Gretta Cool) SOLN   NDC: L6038910, Lot: [id number], Expiration date: 02/05/2020   Route: Intravitreal, Site: Right Eye, Waste: 0.05 mL  Post-op Post injection exam found visual acuity of at least counting fingers. The patient tolerated the procedure well. There were no complications. The patient received written and verbal post procedure care education.                  ASSESSMENT/PLAN:    ICD-10-CM   1. Exudative age-related macular degeneration of right eye with active choroidal neovascularization (HCC)  H35.3211 Intravitreal Injection, Pharmacologic Agent - OD - Right Eye    aflibercept (EYLEA) SOLN 2 mg  2. Retinal edema  H35.81 OCT, Retina - OU - Both Eyes  3. Intermediate stage nonexudative age-related macular degeneration of left eye  H35.3122   4. Essential hypertension  I10   5. Hypertensive retinopathy of both eyes  H35.033   6. Pseudophakia of both eyes  Z96.1   7. Left posterior capsular opacification  H26.492     1,2. Exudative age related macular degeneration, right eye  - previous pt of Dr. Caroline More at [facility] in [place], [place] (last injection was IVE on 02.26.20, and was on a 7-8 injection interval) -- pt living in [place] temporarily with family  - The incidence pathology and anatomy of wet AMD discussed   - The ANCHOR, MARINA, CATT and VIEW trials discussed with patient.    - discussed treatment options including observation vs intravitreal anti-VEGF agents such as Avastin, Lucentis, Eylea.    - Risks of endophthalmitis and vascular occlusive events and atrophic changes discussed with patient  - S/P IVE #1 OD (07.08.20), #2 (08.19.20)  - OCT shows trace IRF overlying PED OD -- persistent  - recommend IVE OD #3 today (10.14.20)  - pt wishes to proceed  - RBA of procedure discussed, questions answered  - informed consent obtained and signed  - Eylea4U benefits investigation started on April 25, 2019 -- approved as of 7.8.20  - see procedure note  - f/u in 7 wks -- DFE/OCT/possible injection  3. Age related macular degeneration, non-exudative, both eyes  - The incidence, anatomy, and pathology of dry AMD, risk of progression, and the AREDS and AREDS 2 study including smoking risks discussed with patient.  - Recommend amsler grid monitoring  - f/u 3 months  4,5. Hypertensive retinopathy OU  - discussed  importance of tight BP  control  - monitor  5,6. Pseudophakia OU  - s/p CE/IOL OU  - s/p YAG cap OD  - beautiful surgeries, doing well  - OS with mild noncentral PCO  - monitor   Ophthalmic Meds Ordered this visit:  Meds ordered this encounter  Medications  . aflibercept (EYLEA) SOLN 2 mg       Return in about 7 weeks (around 09/19/2019) for f/u exu ARMD OD, DFE, OCT.  There are no Patient Instructions on file for this visit.   Explained the diagnoses, plan, and follow up with the patient and they expressed understanding.  Patient expressed understanding of the importance of proper follow up care.   This document serves as a record of services personally performed by Karie Chimera, MD, PhD. It was created on their behalf by Annalee Genta, COMT. The creation of this record is the provider's dictation and/or activities during the visit.  Electronically signed by: Annalee Genta, COMT 08/01/19 10:40 PM   Karie Chimera, M.D., Ph.D. Diseases & Surgery of the Retina and Vitreous [facility] 08/01/19  I have reviewed the above documentation for accuracy and completeness, and I agree with the above. Karie Chimera, M.D., Ph.D. 08/01/19 10:43 PM   Abbreviations: M myopia (nearsighted); A astigmatism; H hyperopia (farsighted); P presbyopia; Mrx spectacle prescription;  CTL contact lenses; OD right eye; OS left eye; OU both eyes  XT exotropia; ET esotropia; PEK punctate epithelial keratitis; PEE punctate epithelial erosions; DES dry eye syndrome; MGD meibomian gland dysfunction; ATs artificial tears; PFAT's preservative free artificial tears; NSC nuclear sclerotic cataract; PSC posterior subcapsular cataract; ERM epi-retinal membrane; PVD posterior vitreous detachment; RD retinal detachment; DM diabetes mellitus; DR diabetic retinopathy; NPDR non-proliferative diabetic retinopathy; PDR proliferative diabetic retinopathy; CSME clinically significant macular edema;  DME diabetic macular edema; dbh dot blot hemorrhages; CWS cotton wool spot; POAG primary open angle glaucoma; C/D cup-to-disc ratio; HVF humphrey visual field; GVF goldmann visual field; OCT optical coherence tomography; IOP intraocular pressure; BRVO Branch retinal vein occlusion; CRVO central retinal vein occlusion; CRAO central retinal artery occlusion; BRAO branch retinal artery occlusion; RT retinal tear; SB scleral buckle; PPV pars plana vitrectomy; VH Vitreous hemorrhage; PRP panretinal laser photocoagulation; IVK intravitreal kenalog; VMT vitreomacular traction; MH Macular hole;  NVD neovascularization of the disc; NVE neovascularization elsewhere; AREDS age related eye disease study; ARMD age related macular degeneration; POAG primary open angle glaucoma; EBMD epithelial/anterior basement membrane dystrophy; ACIOL anterior chamber intraocular lens; IOL intraocular lens; PCIOL posterior chamber intraocular lens; Phaco/IOL phacoemulsification with intraocular lens placement; PRK photorefractive keratectomy; LASIK laser assisted in situ keratomileusis; HTN hypertension; DM diabetes mellitus; COPD chronic obstructive pulmonary disease

## 2019-08-01 ENCOUNTER — Encounter (INDEPENDENT_AMBULATORY_CARE_PROVIDER_SITE_OTHER): Payer: Self-pay | Admitting: Ophthalmology

## 2019-08-01 ENCOUNTER — Other Ambulatory Visit: Payer: Self-pay

## 2019-08-01 ENCOUNTER — Ambulatory Visit (INDEPENDENT_AMBULATORY_CARE_PROVIDER_SITE_OTHER): Payer: Medicare Other | Admitting: Ophthalmology

## 2019-08-01 DIAGNOSIS — H3581 Retinal edema: Secondary | ICD-10-CM | POA: Diagnosis not present

## 2019-08-01 DIAGNOSIS — H353211 Exudative age-related macular degeneration, right eye, with active choroidal neovascularization: Secondary | ICD-10-CM

## 2019-08-01 DIAGNOSIS — I10 Essential (primary) hypertension: Secondary | ICD-10-CM | POA: Diagnosis not present

## 2019-08-01 DIAGNOSIS — H35033 Hypertensive retinopathy, bilateral: Secondary | ICD-10-CM

## 2019-08-01 DIAGNOSIS — H353122 Nonexudative age-related macular degeneration, left eye, intermediate dry stage: Secondary | ICD-10-CM

## 2019-08-01 DIAGNOSIS — Z961 Presence of intraocular lens: Secondary | ICD-10-CM

## 2019-08-01 DIAGNOSIS — H26492 Other secondary cataract, left eye: Secondary | ICD-10-CM

## 2019-08-01 MED ORDER — AFLIBERCEPT 2MG/0.05ML IZ SOLN FOR KALEIDOSCOPE
2.0000 mg | INTRAVITREAL | Status: AC | PRN
  Administered 2019-08-01: 15:00:00 2 mg via INTRAVITREAL

## 2019-09-19 ENCOUNTER — Encounter (INDEPENDENT_AMBULATORY_CARE_PROVIDER_SITE_OTHER): Payer: Medicare Other | Admitting: Ophthalmology

## 2019-10-05 ENCOUNTER — Encounter (INDEPENDENT_AMBULATORY_CARE_PROVIDER_SITE_OTHER): Payer: Medicare Other | Admitting: Ophthalmology

## 2019-10-05 ENCOUNTER — Encounter (HOSPITAL_COMMUNITY): Payer: Self-pay | Admitting: *Deleted

## 2019-10-05 ENCOUNTER — Emergency Department (HOSPITAL_COMMUNITY): Payer: Medicare Other

## 2019-10-05 ENCOUNTER — Inpatient Hospital Stay (HOSPITAL_COMMUNITY)
Admission: EM | Admit: 2019-10-05 | Discharge: 2019-10-10 | DRG: 177 | Disposition: A | Discharge status: Skilled Nursing Facility | Payer: Medicare Other | Attending: Internal Medicine | Admitting: Internal Medicine

## 2019-10-05 ENCOUNTER — Other Ambulatory Visit: Payer: Self-pay

## 2019-10-05 DIAGNOSIS — H353211 Exudative age-related macular degeneration, right eye, with active choroidal neovascularization: Secondary | ICD-10-CM

## 2019-10-05 DIAGNOSIS — J1289 Other viral pneumonia: Secondary | ICD-10-CM | POA: Diagnosis present

## 2019-10-05 DIAGNOSIS — R5381 Other malaise: Secondary | ICD-10-CM | POA: Diagnosis present

## 2019-10-05 DIAGNOSIS — I10 Essential (primary) hypertension: Secondary | ICD-10-CM | POA: Diagnosis present

## 2019-10-05 DIAGNOSIS — Z6827 Body mass index (BMI) 27.0-27.9, adult: Secondary | ICD-10-CM | POA: Diagnosis not present

## 2019-10-05 DIAGNOSIS — Z79899 Other long term (current) drug therapy: Secondary | ICD-10-CM | POA: Diagnosis not present

## 2019-10-05 DIAGNOSIS — Z7409 Other reduced mobility: Secondary | ICD-10-CM | POA: Diagnosis present

## 2019-10-05 DIAGNOSIS — R001 Bradycardia, unspecified: Secondary | ICD-10-CM

## 2019-10-05 DIAGNOSIS — H353122 Nonexudative age-related macular degeneration, left eye, intermediate dry stage: Secondary | ICD-10-CM

## 2019-10-05 DIAGNOSIS — J9601 Acute respiratory failure with hypoxia: Secondary | ICD-10-CM | POA: Diagnosis present

## 2019-10-05 DIAGNOSIS — E039 Hypothyroidism, unspecified: Secondary | ICD-10-CM | POA: Diagnosis present

## 2019-10-05 DIAGNOSIS — U071 COVID-19: Secondary | ICD-10-CM | POA: Diagnosis not present

## 2019-10-05 DIAGNOSIS — H26492 Other secondary cataract, left eye: Secondary | ICD-10-CM

## 2019-10-05 DIAGNOSIS — H3581 Retinal edema: Secondary | ICD-10-CM

## 2019-10-05 DIAGNOSIS — E669 Obesity, unspecified: Secondary | ICD-10-CM | POA: Diagnosis present

## 2019-10-05 DIAGNOSIS — H35033 Hypertensive retinopathy, bilateral: Secondary | ICD-10-CM

## 2019-10-05 DIAGNOSIS — Z961 Presence of intraocular lens: Secondary | ICD-10-CM

## 2019-10-05 LAB — CBC WITH DIFFERENTIAL/PLATELET
Abs Immature Granulocytes: 0.12 10*3/uL — ABNORMAL HIGH (ref 0.00–0.07)
Basophils Absolute: 0 10*3/uL (ref 0.0–0.1)
Basophils Relative: 0 %
Eosinophils Absolute: 0.1 10*3/uL (ref 0.0–0.5)
Eosinophils Relative: 1 %
HCT: 38.2 % (ref 36.0–46.0)
Hemoglobin: 12 g/dL (ref 12.0–15.0)
Immature Granulocytes: 1 %
Lymphocytes Relative: 9 %
Lymphs Abs: 0.8 10*3/uL (ref 0.7–4.0)
MCH: 28.3 pg (ref 26.0–34.0)
MCHC: 31.4 g/dL (ref 30.0–36.0)
MCV: 90.1 fL (ref 80.0–100.0)
Monocytes Absolute: 0.8 10*3/uL (ref 0.1–1.0)
Monocytes Relative: 9 %
Neutro Abs: 7.4 10*3/uL (ref 1.7–7.7)
Neutrophils Relative %: 80 %
Platelets: 332 10*3/uL (ref 150–400)
RBC: 4.24 MIL/uL (ref 3.87–5.11)
RDW: 14.3 % (ref 11.5–15.5)
WBC: 9.1 10*3/uL (ref 4.0–10.5)
nRBC: 0 % (ref 0.0–0.2)

## 2019-10-05 LAB — COMPREHENSIVE METABOLIC PANEL
ALT: 34 U/L (ref 0–44)
AST: 36 U/L (ref 15–41)
Albumin: 3 g/dL — ABNORMAL LOW (ref 3.5–5.0)
Alkaline Phosphatase: 74 U/L (ref 38–126)
Anion gap: 11 (ref 5–15)
BUN: 36 mg/dL — ABNORMAL HIGH (ref 8–23)
CO2: 21 mmol/L — ABNORMAL LOW (ref 22–32)
Calcium: 8.3 mg/dL — ABNORMAL LOW (ref 8.9–10.3)
Chloride: 108 mmol/L (ref 98–111)
Creatinine, Ser: 0.79 mg/dL (ref 0.44–1.00)
GFR calc Af Amer: >60 mL/min (ref >60)
GFR calc non Af Amer: >60 mL/min (ref >60)
Glucose, Bld: 96 mg/dL (ref 70–99)
Potassium: 3.5 mmol/L (ref 3.5–5.1)
Sodium: 140 mmol/L (ref 135–145)
Total Bilirubin: 1.5 mg/dL — ABNORMAL HIGH (ref 0.3–1.2)
Total Protein: 6.8 g/dL (ref 6.5–8.1)

## 2019-10-05 LAB — LACTATE DEHYDROGENASE: LDH: 261 U/L — ABNORMAL HIGH (ref 98–192)

## 2019-10-05 LAB — POC SARS CORONAVIRUS 2 AG -  ED: SARS Coronavirus 2 Ag: NEGATIVE

## 2019-10-05 LAB — FIBRINOGEN: Fibrinogen: 739 mg/dL — ABNORMAL HIGH (ref 210–475)

## 2019-10-05 LAB — TROPONIN I (HIGH SENSITIVITY)
Troponin I (High Sensitivity): 104 ng/L
Troponin I (High Sensitivity): 71 ng/L — ABNORMAL HIGH

## 2019-10-05 LAB — FERRITIN: Ferritin: 375 ng/mL — ABNORMAL HIGH (ref 11–307)

## 2019-10-05 LAB — LACTIC ACID, PLASMA
Lactic Acid, Venous: 1.2 mmol/L (ref 0.5–1.9)
Lactic Acid, Venous: 1.3 mmol/L (ref 0.5–1.9)

## 2019-10-05 LAB — D-DIMER, QUANTITATIVE: D-Dimer, Quant: 2.12 ug{FEU}/mL — ABNORMAL HIGH (ref 0.00–0.50)

## 2019-10-05 LAB — TRIGLYCERIDES: Triglycerides: 85 mg/dL

## 2019-10-05 LAB — TSH: TSH: 1.405 u[IU]/mL (ref 0.350–4.500)

## 2019-10-05 LAB — PROCALCITONIN: Procalcitonin: <0.1 ng/mL

## 2019-10-05 LAB — C-REACTIVE PROTEIN: CRP: 7.9 mg/dL — ABNORMAL HIGH

## 2019-10-05 MED ORDER — MIRTAZAPINE 15 MG PO TABS
15.0000 mg | ORAL_TABLET | Freq: Every day | ORAL | Status: DC
  Administered 2019-10-06 – 2019-10-10 (×6): 15 mg via ORAL
  Filled 2019-10-05 (×6): qty 1

## 2019-10-05 MED ORDER — DEXAMETHASONE 4 MG PO TABS
6.0000 mg | ORAL_TABLET | ORAL | Status: DC
  Administered 2019-10-06 – 2019-10-09 (×5): 6 mg via ORAL
  Filled 2019-10-05 (×6): qty 2

## 2019-10-05 MED ORDER — LEVOTHYROXINE SODIUM 50 MCG PO TABS
75.0000 ug | ORAL_TABLET | Freq: Every day | ORAL | Status: DC
  Administered 2019-10-06 – 2019-10-10 (×5): 75 ug via ORAL
  Filled 2019-10-05 (×6): qty 1

## 2019-10-05 MED ORDER — ONDANSETRON HCL 4 MG/2ML IJ SOLN
4.0000 mg | Freq: Once | INTRAMUSCULAR | Status: AC
  Administered 2019-10-05: 4 mg via INTRAVENOUS
  Filled 2019-10-05: qty 2

## 2019-10-05 MED ORDER — DEXAMETHASONE SODIUM PHOSPHATE 10 MG/ML IJ SOLN
6.0000 mg | Freq: Once | INTRAMUSCULAR | Status: AC
  Administered 2019-10-05: 6 mg via INTRAVENOUS
  Filled 2019-10-05: qty 1

## 2019-10-05 MED ORDER — SODIUM CHLORIDE 0.9 % IV SOLN: 100.0000 mg | Freq: Every day | INTRAVENOUS | Status: DC

## 2019-10-05 MED ORDER — SODIUM CHLORIDE 0.9 % IV BOLUS
500.0000 mL | Freq: Once | INTRAVENOUS | Status: AC
  Administered 2019-10-05: 500 mL via INTRAVENOUS

## 2019-10-05 MED ORDER — PREGABALIN 50 MG PO CAPS
50.0000 mg | ORAL_CAPSULE | Freq: Every day | ORAL | Status: DC
  Administered 2019-10-06 – 2019-10-10 (×5): 50 mg via ORAL
  Filled 2019-10-05 (×5): qty 1

## 2019-10-05 MED ORDER — SODIUM CHLORIDE 0.9 % IV SOLN
1.0000 g | Freq: Once | INTRAVENOUS | Status: AC
  Administered 2019-10-05: 1 g via INTRAVENOUS
  Filled 2019-10-05: qty 10

## 2019-10-05 MED ORDER — SODIUM CHLORIDE 0.9 % IV SOLN
100.0000 mg | Freq: Every day | INTRAVENOUS | Status: AC
  Administered 2019-10-06 – 2019-10-09 (×4): 100 mg via INTRAVENOUS
  Filled 2019-10-05 (×4): qty 100

## 2019-10-05 MED ORDER — SODIUM CHLORIDE 0.9 % IV SOLN: 200.0000 mg | Freq: Once | INTRAVENOUS | Status: DC

## 2019-10-05 MED ORDER — POLYETHYLENE GLYCOL 3350 17 G PO PACK: 17.0000 g | PACK | Freq: Every day | ORAL | Status: DC | PRN

## 2019-10-05 MED ORDER — ENOXAPARIN SODIUM 40 MG/0.4ML ~~LOC~~ SOLN
40.0000 mg | Freq: Every day | SUBCUTANEOUS | Status: DC
  Administered 2019-10-06 – 2019-10-07 (×3): 40 mg via SUBCUTANEOUS
  Filled 2019-10-05 (×3): qty 0.4

## 2019-10-05 MED ORDER — ONDANSETRON HCL 4 MG PO TABS: 4.0000 mg | ORAL_TABLET | Freq: Four times a day (QID) | ORAL | Status: DC | PRN

## 2019-10-05 MED ORDER — ONDANSETRON HCL 4 MG/2ML IJ SOLN
4.0000 mg | Freq: Four times a day (QID) | INTRAMUSCULAR | Status: DC | PRN
  Administered 2019-10-08: 4 mg via INTRAVENOUS
  Filled 2019-10-05: qty 2

## 2019-10-05 MED ORDER — ALBUTEROL SULFATE HFA 108 (90 BASE) MCG/ACT IN AERS: 2.0000 | INHALATION_SPRAY | Freq: Four times a day (QID) | RESPIRATORY_TRACT | Status: DC | PRN

## 2019-10-05 MED ORDER — TRAZODONE HCL 100 MG PO TABS
100.0000 mg | ORAL_TABLET | Freq: Every day | ORAL | Status: DC
  Administered 2019-10-06 – 2019-10-10 (×6): 100 mg via ORAL
  Filled 2019-10-05 (×6): qty 1

## 2019-10-05 MED ORDER — LISINOPRIL 10 MG PO TABS
10.0000 mg | ORAL_TABLET | Freq: Every day | ORAL | Status: DC
  Administered 2019-10-06 – 2019-10-10 (×5): 10 mg via ORAL
  Filled 2019-10-05 (×5): qty 1

## 2019-10-05 MED ORDER — SODIUM CHLORIDE 0.9 % IV SOLN
500.0000 mg | Freq: Once | INTRAVENOUS | Status: AC
  Administered 2019-10-05: 500 mg via INTRAVENOUS
  Filled 2019-10-05: qty 500

## 2019-10-05 MED ORDER — BUSPIRONE HCL 5 MG PO TABS
7.5000 mg | ORAL_TABLET | Freq: Two times a day (BID) | ORAL | Status: DC
  Administered 2019-10-06 – 2019-10-10 (×11): 7.5 mg via ORAL
  Filled 2019-10-05 (×11): qty 2

## 2019-10-05 MED ORDER — SODIUM CHLORIDE 0.9 % IV SOLN
200.0000 mg | Freq: Once | INTRAVENOUS | Status: AC
  Administered 2019-10-05: 200 mg via INTRAVENOUS
  Filled 2019-10-05: qty 200

## 2019-10-05 MED ORDER — SODIUM CHLORIDE 0.9 % IV SOLN: INTRAVENOUS | Status: AC

## 2019-10-05 MED ORDER — BENZONATATE 100 MG PO CAPS
100.0000 mg | ORAL_CAPSULE | Freq: Three times a day (TID) | ORAL | Status: DC
  Administered 2019-10-06 – 2019-10-10 (×16): 100 mg via ORAL
  Filled 2019-10-05 (×15): qty 1

## 2019-10-05 NOTE — ED Triage Notes (Signed)
Pt BIB EMS and coming from home.  Pt presents with chills, nausea, and decreased oral intake. Pt was 90% on RA on EMS arrival, EMS has titrated pt's O2 to 4L [place] to maintain O2 above 97%. Pt a/o x 4 and ambulatory.  Pt is dyspneic with exertion. EMS did observe any symptoms during transport. Pt reports generalized weakness/mailaise.

## 2019-10-05 NOTE — H&P (Signed)
History and Physical    Alexis Cortez Alexis Cortez:[id number] DOB: [birth year] DOA: 10/05/2019  PCP: Jettie Booze, NP  Patient coming from: Home  I have personally briefly reviewed patient's old medical records in [place]  Chief Complaint: Abdominal discomfort  HPI: Alexis Cortez is a 85 y.o. female with medical history significant of hypertension, hypothyroidism and obesity.  Patient has been at her baseline until 09/29/2019 when she was initially diagnosed with COVID-19.  In the past few days, patient has been experiencing intermittent abdominal discomfort about 5/10, aching, nonradiating with no specific relieving or aggravating factor.  There is associated anorexia, nausea and general malaise.  She denies cough, chest pains, shortness of breath, fever, diarrhea, frequency, dysuria, seizures, headache or loss of consciousness.  Based on her worsening symptoms, she came to the ED for further evaluation.  ED Course: At the ED, she had a blood pressure of 173/66, pulse of 47, respiratory 22.  Patient reportedly had episodes of desaturation and EMS had to titrate her to 4 L/min oxygen.  Her chest radiography revealed right upper lobe opacity suspicious for pneumonia and bibasilar opacities.  Review of Systems: As per HPI otherwise 10 point review of systems negative.   Past Medical History:  Diagnosis Date  . Hypertension   . Hypertensive retinopathy    OU  . Macular degeneration    Wet OD, Dry OS    Past Surgical History:  Procedure Laterality Date  . CATARACT EXTRACTION Bilateral   . EYE SURGERY    . YAG LASER APPLICATION Right      reports that she has never smoked. She has never used smokeless tobacco. She reports current alcohol use of about 2.0 standard drinks of alcohol per week. She reports that she does not use drugs.  No Known Allergies  No family history on file. Negative for COVID 19   Prior to Admission medications   Medication Sig Start Date End Date  Taking? Authorizing Provider  albuterol (VENTOLIN HFA) 108 (90 Base) MCG/ACT inhaler Inhale 2 puffs into the lungs every 6 (six) hours as needed for wheezing or shortness of breath. 08/31/19  Yes [provider]  benzonatate (TESSALON) 100 MG capsule Take 100 mg by mouth 3 (three) times daily. 10/01/19  Yes [provider]  busPIRone (BUSPAR) 7.5 MG tablet Take 7.5 mg by mouth 2 (two) times daily.  04/25/19  Yes [provider]  [employer] PURELAX 17 GM/SCOOP powder Take 17 g by mouth daily as needed for moderate constipation.  04/09/19  Yes [provider]  docusate sodium (COLACE) 100 MG capsule Take 100 mg by mouth daily as needed for mild constipation.    Yes [provider]  levothyroxine (SYNTHROID) 75 MCG tablet Take 75 mcg by mouth daily. 09/27/19  Yes [provider]  lisinopril (ZESTRIL) 10 MG tablet Take 10 mg by mouth daily.    Yes [provider]  mirtazapine (REMERON) 15 MG tablet Take 15 mg by mouth at bedtime.    Yes [provider]  Multiple Vitamin tablet Take 1 tablet by mouth daily.    Yes [provider]  pregabalin (LYRICA) 50 MG capsule Take 50 mg by mouth daily.    Yes [provider]  Skin Protectants, Misc. (EUCERIN) cream Apply 1 application topically as needed for dry skin.  04/09/19 04/08/20 Yes [provider]  traZODone (DESYREL) 100 MG tablet Take 100 mg by mouth at bedtime.    Yes [provider]  Misc. Devices MISC Knee high compression stockings 18-16mmHg pressure to be worn during daytime hours and off at night Dx: peripheral edema 04/09/19   [provider]    Physical Exam: Vitals:   10/05/19 1859 10/05/19 1900 10/05/19 1940 10/05/19 2000  BP:  (!) 180/71 (!) 176/72 (!) 171/85  Pulse: (!) 53 (!) 49 (!) 46 (!) 43  Resp: 16 (!) 25 19 19   Temp:      TempSrc:      SpO2: 97% 99% 97% 100%    Constitutional: NAD, calm, comfortable Vitals:   10/05/19 1859  10/05/19 1900 10/05/19 1940 10/05/19 2000  BP:  (!) 180/71 (!) 176/72 (!) 171/85  Pulse: (!) 53 (!) 49 (!) 46 (!) 43  Resp: 16 (!) 25 19 19   Temp:      TempSrc:      SpO2: 97% 99% 97% 100%   Eyes: PERRL, lids and conjunctivae normal ENMT: Mucous membranes are moist. Posterior pharynx clear of any exudate or lesions.Normal dentition.  Neck: normal, supple, no masses, no thyromegaly Respiratory: clear to auscultation bilaterally, no wheezing, no crackles. Normal respiratory effort. No accessory muscle use.  Cardiovascular: Regular rate and rhythm, no murmurs / rubs / gallops. No extremity edema. 2+ pedal pulses. No carotid bruits.  Abdomen: no tenderness, no masses palpated. No hepatosplenomegaly. Bowel sounds positive.  Musculoskeletal: no clubbing / cyanosis. No joint deformity upper and lower extremities. Good ROM, no contractures. Normal muscle tone.  Skin: no rashes, lesions, ulcers. No induration Neurologic: CN 2-12 grossly intact. Sensation intact, DTR normal. Strength 5/5 in all 4.  Psychiatric: Normal judgment and insight. Alert and oriented x 3. Normal mood.   Labs on Admission: I have personally reviewed following labs and imaging studies  CBC: Recent Labs  Lab 10/05/19 1812  WBC 9.1  NEUTROABS 7.4  HGB 12.0  HCT 38.2  MCV 90.1  PLT 332   Basic Metabolic Panel: Recent Labs  Lab 10/05/19 1812  NA 140  K 3.5  CL 108  CO2 21*  GLUCOSE 96  BUN 36*  CREATININE 0.79  CALCIUM 8.3*   GFR: CrCl cannot be calculated (Unknown ideal weight.). Liver Function Tests: Recent Labs  Lab 10/05/19 1812  AST 36  ALT 34  ALKPHOS 74  BILITOT 1.5*  PROT 6.8  ALBUMIN 3.0*   No results for input(s): LIPASE, AMYLASE in the last 168 hours. No results for input(s): AMMONIA in the last 168 hours. Coagulation Profile: No results for input(s): INR, PROTIME in the last 168 hours. Cardiac Enzymes: No results for input(s): CKTOTAL, CKMB, CKMBINDEX, TROPONINI in the last 168  hours. BNP (last 3 results) No results for input(s): PROBNP in the last 8760 hours. HbA1C: No results for input(s): HGBA1C in the last 72 hours. CBG: No results for input(s): GLUCAP in the last 168 hours. Lipid Profile: Recent Labs    10/05/19 1814  TRIG 85   Thyroid Function Tests: No results for input(s): TSH, T4TOTAL, FREET4, T3FREE, THYROIDAB in the last 72 hours. Anemia Panel: Recent Labs    10/05/19 1814  FERRITIN 375*   Urine analysis: No results found for: COLORURINE, APPEARANCEUR, LABSPEC, PHURINE, GLUCOSEU, HGBUR, BILIRUBINUR, KETONESUR, PROTEINUR, UROBILINOGEN, NITRITE, LEUKOCYTESUR  Radiological Exams on Admission: DG Chest Port 1 View  Result Date: 10/05/2019 CLINICAL DATA:  Acute shortness of breath EXAM: PORTABLE CHEST 1 VIEW COMPARISON:  None. FINDINGS: This is a mildly low volume film. Cardiomediastinal silhouette is unremarkable. Patchy opacities within the RIGHT UPPER lobe and bibasilar regions identified.  No definite pleural effusion or pneumothorax. No acute bony abnormalities are identified. IMPRESSION: RIGHT UPPER lobe opacities suspicious for pneumonia. Recommend radiographic follow-up to resolution. Bibasilar opacities which may represent atelectasis versus airspace disease/pneumonia. Electronically Signed   By: Harmon PierJeffrey  Hu M.D.   On: 10/05/2019 17:46    EKG: Independently reviewed Sinus bradycardia  Assessment/Plan Active Problems:   COVID-19 virus infection   1. COVID-19 with pneumonia: Patient had episodes of hypoxia with EMS following which they titrated her oxygen to 4 L/min.  She will be started on dexamethasone and remdesivir.  Clinical suspicion for bacterial superimposed pneumonia is low.  Check procalcitonin levels.  Continue supportive care.  Monitor inflammatory markers  2.  Hypothyroidism: Continue levothyroxine.  TSH was 1.4  3.  Hypertension: Monitor blood pressure profile.  Continue lisinopril  4.  Sinus bradycardia: Asymptomatic.   Patient is not on any AV nodal blocking agents.  Monitor patient on telemetry.  We will consider echocardiogram based on clinical course.  Check TSH.  DVT prophylaxis: Lovenox  Code Status: Full code Family Communication:  Disposition Plan: Anticipated discharge home when medically stable Consults called: None Admission status: Inpatient telemetry  Leonette NuttingEgya N Halo Shevlin MD [facility] Pager [phone]  If 7PM-7AM, please contact night-coverage [email or web address] Password [email or web address]  10/05/2019, 9:10 PM

## 2019-10-05 NOTE — ED Notes (Signed)
Attempted to ambulate pt inside rm but pt states "I don't want to right now". Pt encouraged to try to ambulate but still refuses. Writer attempted to reposition pt in bed but pt unable to move herself up in bed. Pt needing x2 assist at this time to reposition in bed.

## 2019-10-05 NOTE — ED Notes (Signed)
Date and time results received: 10/05/19 7:44 PM  (use smartphrase ".now" to insert current time)  Test: troponin Critical Value: 104  Name of Provider Notified: Ezequiel Essex, MD  Orders Received? Or Actions Taken?:

## 2019-10-05 NOTE — ED Notes (Signed)
Dr. Wyvonnia Dusky made aware of pt's HR of 44 and BP of 173/66.

## 2019-10-05 NOTE — ED Provider Notes (Signed)
[facility]-EMERGENCY DEPT Provider Note   CSN: [id number] Arrival date & time: 10/05/19  1633     History No chief complaint on file.   Alexis Cortez is a 85 y.o. female.  Patient brought in by EMS with a 5-day history of nausea, chills, decreased oral intake, anorexia.  She was diagnosed with coronavirus on December 12 and has had multiple sick contacts at home.  EMS reports were called out to her home today 3 times for fatigue, weakness, shortness of breath and anorexia.  They were able to convince patient to be transported on the third visit.  Patient's son is also sick with suspected coronavirus.  Patient on arrival has no complaints.  States she feels fatigued but does not feel short of breath or having any chest pain.  Denies any cough or fever.  EMS reports she was standing 90% on arrival and required nasal cannula.  There is been nausea but no vomiting.  No abdominal pain no pain with urination or blood in the urine.  No leg pain or leg swelling.  Only medical history is hypertension.  The history is provided by the patient, the EMS personnel and a relative.       Past Medical History:  Diagnosis Date  . Hypertension   . Hypertensive retinopathy    OU  . Macular degeneration    Wet OD, Dry OS    There are no problems to display for this patient.   Past Surgical History:  Procedure Laterality Date  . CATARACT EXTRACTION Bilateral   . EYE SURGERY    . YAG LASER APPLICATION Right      OB History   No obstetric history on file.     No family history on file.  Social History   Tobacco Use  . Smoking status: Never Smoker  . Smokeless tobacco: Never Used  Substance Use Topics  . Alcohol use: Not on file  . Drug use: Not on file    Home Medications Prior to Admission medications   Medication Sig Start Date End Date Taking? Authorizing Provider  busPIRone (BUSPAR) 15 MG tablet Take 15 mg by mouth 2 (two) times daily. 03/21/19   [provider]  busPIRone (BUSPAR) 7.5 MG tablet Take by mouth. 04/25/19   [provider]  clonazePAM (KLONOPIN) 1 MG tablet Take 1 mg by mouth 2 (two) times daily. 11/17/18   [provider]  [employer] PURELAX 17 GM/SCOOP powder TAKE 17G BY MOUTH DAILY FOR 3 DAYS. 04/09/19   [provider]  cyanocobalamin (,VITAMIN B-12,) 1000 MCG/ML injection INJECT 1 ML TWICE A MONTH 01/19/19   [provider]  docusate sodium (COLACE) 100 MG capsule Take by mouth.    [provider]  esomeprazole (NEXIUM) 40 MG capsule Take by mouth.    [provider]  gabapentin (NEURONTIN) 100 MG capsule 1 CAPSULE(S) THREE TIMES A DAY 01/23/19   [provider]  levothyroxine (SYNTHROID) 50 MCG tablet Take by mouth.    [provider]  lisinopril (ZESTRIL) 10 MG tablet Take by mouth.    [provider]  LORazepam (ATIVAN) 0.5 MG tablet TAKE 1 TABLET BY MOUTH 30 MIN PRIOR TO MRI MAY REPEAT IN 15 MINUTES IF NEEDED 12/27/18   [provider]  mirtazapine (REMERON) 15 MG tablet Take by mouth.    [provider]  Misc. Devices MISC Knee high compression stockings 18-5231mmHg pressure to be worn during daytime hours and off at night  Dx: peripheral edema 04/09/19   [provider]  Multiple Vitamin tablet Take by mouth.    [provider]  mupirocin ointment (BACTROBAN) 2 % APPLY TO AFFECTED AREA 3 TIMES A DAY 04/14/19   [provider]  oxyCODONE-acetaminophen (PERCOCET/ROXICET) 5-325 MG tablet Take 1 tablet by mouth every 4 (four) hours as needed. for pain 01/21/19   [provider]  predniSONE (DELTASONE) 50 MG tablet TAKE 1 TABLET BY MOUTH EVERY DAY FOR 5 DAYS 12/27/18   [provider]  pregabalin (LYRICA) 100 MG capsule TAKE 1 CAPSULE BY MOUTH THREE TIMES A DAY 05/11/19   [provider]  pregabalin (LYRICA) 50 MG capsule Take by mouth.    [provider]  Skin Protectants, Misc.  (EUCERIN) cream Apply topically. 04/09/19 04/08/20  [provider]  sulfamethoxazole-trimethoprim (BACTRIM DS) 800-160 MG tablet Take 1 tablet by mouth 2 (two) times daily. for 10 days 03/27/19   [provider]  traZODone (DESYREL) 100 MG tablet Take by mouth.    [provider]    Allergies    Patient has no known allergies.  Review of Systems   Review of Systems  Constitutional: Positive for activity change, appetite change and fatigue. Negative for fever.  HENT: Negative for congestion and rhinorrhea.   Eyes: Negative for visual disturbance.  Respiratory: Negative for cough, chest tightness and shortness of breath.   Gastrointestinal: Positive for nausea. Negative for abdominal pain and vomiting.  Genitourinary: Negative for dysuria and hematuria.  Musculoskeletal: Positive for arthralgias and myalgias.  Skin: Negative for rash.  Neurological: Positive for weakness and headaches. Negative for dizziness.   all other systems are negative except as noted in the HPI and PMH.    Physical Exam Updated Vital Signs BP (!) 174/85 (BP Location: Left Arm)   Pulse (!) 57   Temp 98.4 F (36.9 C) (Oral)   Resp (!) 24   SpO2 95%   Physical Exam Vitals and nursing note reviewed.  Constitutional:      General: She is not in acute distress.    Appearance: She is well-developed. She is obese.     Comments: No distress, speaking full sentence  HENT:     Head: Normocephalic and atraumatic.     Mouth/Throat:     Pharynx: No oropharyngeal exudate.  Eyes:     Conjunctiva/sclera: Conjunctivae normal.     Pupils: Pupils are equal, round, and reactive to light.  Neck:     Comments: No meningismus. Cardiovascular:     Rate and Rhythm: Normal rate and regular rhythm.     Heart sounds: Normal heart sounds. No murmur.  Pulmonary:     Effort: Pulmonary effort is normal. No respiratory distress.     Breath sounds: Normal breath sounds. No wheezing.  Abdominal:      Palpations: Abdomen is soft.     Tenderness: There is no abdominal tenderness. There is no guarding or rebound.  Musculoskeletal:        General: No tenderness. Normal range of motion.     Cervical back: Normal range of motion and neck supple.  Skin:    General: Skin is warm.  Neurological:     General: No focal deficit present.     Mental Status: She is alert and oriented to person, place, and time. Mental status is at baseline.     Cranial Nerves: No cranial nerve deficit.     Motor: No abnormal muscle tone.     Coordination: Coordination  normal.     Comments:  5/5 strength throughout. CN 2-12 intact.Equal grip strength.   Psychiatric:        Behavior: Behavior normal.     ED Results / Procedures / Treatments   Labs (all labs ordered are listed, but only abnormal results are displayed) Labs Reviewed  CBC WITH DIFFERENTIAL/PLATELET - Abnormal; Notable for the following components:      Result Value   Abs Immature Granulocytes 0.12 (*)    All other components within normal limits  COMPREHENSIVE METABOLIC PANEL - Abnormal; Notable for the following components:   CO2 21 (*)    BUN 36 (*)    Calcium 8.3 (*)    Albumin 3.0 (*)    Total Bilirubin 1.5 (*)    All other components within normal limits  D-DIMER, QUANTITATIVE (NOT AT [facility]) - Abnormal; Notable for the following components:   D-Dimer, Quant 2.12 (*)    All other components within normal limits  LACTATE DEHYDROGENASE - Abnormal; Notable for the following components:   LDH 261 (*)    All other components within normal limits  FERRITIN - Abnormal; Notable for the following components:   Ferritin 375 (*)    All other components within normal limits  FIBRINOGEN - Abnormal; Notable for the following components:   Fibrinogen 739 (*)    All other components within normal limits  C-REACTIVE PROTEIN - Abnormal; Notable for the following components:   CRP 7.9 (*)    All other components within normal limits  TROPONIN I  (HIGH SENSITIVITY) - Abnormal; Notable for the following components:   Troponin I (High Sensitivity) 104 (*)    All other components within normal limits  TROPONIN I (HIGH SENSITIVITY) - Abnormal; Notable for the following components:   Troponin I (High Sensitivity) 71 (*)    All other components within normal limits  SARS CORONAVIRUS 2 (TAT 6-24 HRS)  CULTURE, BLOOD (ROUTINE X 2)  CULTURE, BLOOD (ROUTINE X 2)  LACTIC ACID, PLASMA  LACTIC ACID, PLASMA  PROCALCITONIN  TRIGLYCERIDES  TSH  CBC WITH DIFFERENTIAL/PLATELET  COMPREHENSIVE METABOLIC PANEL  C-REACTIVE PROTEIN  D-DIMER, QUANTITATIVE (NOT AT [facility])  FERRITIN  MAGNESIUM  POC SARS CORONAVIRUS 2 AG -  ED  ABO/RH    EKG EKG Interpretation  Date/Time:  Friday October 05 2019 17:42:42 EST Ventricular Rate:  43 PR Interval:    QRS Duration: 86 QT Interval:  524 QTC Calculation: 444 R Axis:   50 Text Interpretation: Sinus bradycardia Baseline wander in lead(s) V3 V6 Rate slower Confirmed by Ezequiel Essex [phone]) on 10/05/2019 5:52:05 PM   Radiology DG Chest Port 1 View  Result Date: 10/05/2019 CLINICAL DATA:  Acute shortness of breath EXAM: PORTABLE CHEST 1 VIEW COMPARISON:  None. FINDINGS: This is a mildly low volume film. Cardiomediastinal silhouette is unremarkable. Patchy opacities within the RIGHT UPPER lobe and bibasilar regions identified. No definite pleural effusion or pneumothorax. No acute bony abnormalities are identified. IMPRESSION: RIGHT UPPER lobe opacities suspicious for pneumonia. Recommend radiographic follow-up to resolution. Bibasilar opacities which may represent atelectasis versus airspace disease/pneumonia. Electronically Signed   By: Margarette Canada M.D.   On: 10/05/2019 17:46    Procedures Procedures (including critical care time)  Medications Ordered in ED Medications - No data to display  ED Course  I have reviewed the triage vital signs and the nursing notes.  Pertinent labs & imaging  results that were available during my care of the patient were reviewed by me and considered in  my medical decision making (see chart for details).    MDM Rules/Calculators/A&P                      5 days of fatigue, chills, nausea, anorexia with poor p.o. intake.  Denies chest pain or shortness of breath on arrival.  Covid positive on December 12.  Patient in no distress.  Found to be bradycardic in the 40s which is sinus on EKG.  She appears to be asymptomatic from this.  Chest x-ray is concerning for upper lobe infiltrate bilaterally. Mild troponin elevation 104.   Decadron given with new O2 requirement and antibiotics for suspected PNA as well.   D/w patient's family who would prefer her to be admitted and patient agreeable.  D/w Dr. Ramond Craver was evaluated in Emergency Department on 10/05/2019 for the symptoms described in the history of present illness. She was evaluated in the context of the global COVID-19 pandemic, which necessitated consideration that the patient might be at risk for infection with the SARS-CoV-2 virus that causes COVID-19. Institutional protocols and algorithms that pertain to the evaluation of patients at risk for COVID-19 are in a state of rapid change based on information released by regulatory bodies including the CDC and federal and state organizations. These policies and algorithms were followed during the patient's care in the ED.  Final Clinical Impression(s) / ED Diagnoses Final diagnoses:  COVID-19 virus infection  Sinus bradycardia    Rx / DC Orders ED Discharge Orders    None       Sheretta Grumbine, Jeannett Senior, MD 10/06/19 0129

## 2019-10-05 NOTE — Progress Notes (Signed)
Pharmacy: Remdesivir   Patient is a 85 y.o. female with COVID.  Pharmacy has been consulted for remdesivir dosing.   - CXR shows "RIGHT UPPER lobe opacities suspicious for pneumonia. Recommend radiographic follow-up to resolution.    Bibasilar opacities which may represent atelectasis versus airspace Disease/pneumonia."  -Pt requiring supplemental oxygen (Yes, 2L [place])  -ALT 34     A/P:  - Patient meets criteria for remdesivir. Will initiate remdesivir 200 mg once followed by 100 mg daily x 4 days.  - Daily CMET while on remdesivir - Will f/u pt's ALT and clinical condition

## 2019-10-06 DIAGNOSIS — R001 Bradycardia, unspecified: Secondary | ICD-10-CM

## 2019-10-06 LAB — CBC WITH DIFFERENTIAL/PLATELET
Abs Immature Granulocytes: 0.06 10*3/uL (ref 0.00–0.07)
Basophils Absolute: 0 10*3/uL (ref 0.0–0.1)
Basophils Relative: 0 %
Eosinophils Absolute: 0 10*3/uL (ref 0.0–0.5)
Eosinophils Relative: 0 %
HCT: 35.8 % — ABNORMAL LOW (ref 36.0–46.0)
Hemoglobin: 11.1 g/dL — ABNORMAL LOW (ref 12.0–15.0)
Immature Granulocytes: 1 %
Lymphocytes Relative: 6 %
Lymphs Abs: 0.3 10*3/uL — ABNORMAL LOW (ref 0.7–4.0)
MCH: 28 pg (ref 26.0–34.0)
MCHC: 31 g/dL (ref 30.0–36.0)
MCV: 90.2 fL (ref 80.0–100.0)
Monocytes Absolute: 0.1 10*3/uL (ref 0.1–1.0)
Monocytes Relative: 2 %
Neutro Abs: 4.9 10*3/uL (ref 1.7–7.7)
Neutrophils Relative %: 91 %
Platelets: 263 10*3/uL (ref 150–400)
RBC: 3.97 MIL/uL (ref 3.87–5.11)
RDW: 14.1 % (ref 11.5–15.5)
WBC: 5.3 10*3/uL (ref 4.0–10.5)
nRBC: 0 % (ref 0.0–0.2)

## 2019-10-06 LAB — COMPREHENSIVE METABOLIC PANEL
ALT: 33 U/L (ref 0–44)
AST: 37 U/L (ref 15–41)
Albumin: 2.8 g/dL — ABNORMAL LOW (ref 3.5–5.0)
Alkaline Phosphatase: 70 U/L (ref 38–126)
Anion gap: 9 (ref 5–15)
BUN: 31 mg/dL — ABNORMAL HIGH (ref 8–23)
CO2: 22 mmol/L (ref 22–32)
Calcium: 8 mg/dL — ABNORMAL LOW (ref 8.9–10.3)
Chloride: 110 mmol/L (ref 98–111)
Creatinine, Ser: 0.62 mg/dL (ref 0.44–1.00)
GFR calc Af Amer: >60 mL/min (ref >60)
GFR calc non Af Amer: >60 mL/min (ref >60)
Glucose, Bld: 191 mg/dL — ABNORMAL HIGH (ref 70–99)
Potassium: 3.8 mmol/L (ref 3.5–5.1)
Sodium: 141 mmol/L (ref 135–145)
Total Bilirubin: 1.2 mg/dL (ref 0.3–1.2)
Total Protein: 6.2 g/dL — ABNORMAL LOW (ref 6.5–8.1)

## 2019-10-06 LAB — D-DIMER, QUANTITATIVE: D-Dimer, Quant: 3.03 ug/mL-FEU — ABNORMAL HIGH (ref 0.00–0.50)

## 2019-10-06 LAB — SARS CORONAVIRUS 2 (TAT 6-24 HRS): SARS Coronavirus 2: POSITIVE — AB

## 2019-10-06 LAB — ABO/RH: ABO/RH(D): A POS

## 2019-10-06 LAB — MAGNESIUM: Magnesium: 2.3 mg/dL (ref 1.7–2.4)

## 2019-10-06 LAB — FERRITIN: Ferritin: 373 ng/mL — ABNORMAL HIGH (ref 11–307)

## 2019-10-06 LAB — C-REACTIVE PROTEIN: CRP: 6.5 mg/dL — ABNORMAL HIGH (ref <1.0)

## 2019-10-06 MED ORDER — HYDRALAZINE HCL 20 MG/ML IJ SOLN
5.0000 mg | Freq: Once | INTRAMUSCULAR | Status: AC | PRN
  Administered 2019-10-06: 5 mg via INTRAVENOUS
  Filled 2019-10-06: qty 1

## 2019-10-06 NOTE — Progress Notes (Signed)
PROGRESS NOTE  Alexis Cortez  DOB: 30-Dec-1930  PCP: Jettie Booze, NP BOF:[id number]  DOA: 10/05/2019  LOS: 1 day   Chief complaint:   Brief narrative: Alexis Cortez is a 85 y.o. female with medical history significant of hypertension, hypothyroidism and obesity. Patient was at her baseline until 09/29/2019 when she was initially diagnosed with COVID-19.  In the past few days, patient has been experiencing intermittent abdominal discomfort about 5/10, aching, nonradiating with no specific relieving or aggravating factor. There was associated anorexia, nausea and general malaise. Based on her worsening symptoms, she came to the ED on 12/18 for further evaluation.  In the ED, she had a blood pressure of 173/66, pulse of 47, respiratory 22.   Patient reportedly had episodes of desaturation and EMS had to titrate her to 4 L/min oxygen. Chest x-ray revealed right upper lobe opacity suspicious for pneumonia and bibasilar opacities. COVID-19 PCR positive  Subjective: Patient was seen and examined this morning.  Elderly Caucasian female.  Propped up in bed.  Not in distress.  Feels better than at presentation.  On oxygen by nasal cannula.  Assessment/Plan: COVID pneumonia: -Presented with generalized malaise, abdominal discomfort -Chest x-ray showed right upper lobe opacity and bibasilar opacities   Component Value Date   SARSCOV2NAA POSITIVE (A) 10/05/2019      Lab 10/05/19 1812 10/06/19 0333  WBC 9.1 5.3    10/05/19 1812 10/05/19 1814 10/06/19 0333  DDIMER 2.12*  --  3.03*  FERRITIN  --  375* 373*  LDH 261*  --   --   CRP  --  7.9* 6.5*   Procalcitonin less than 0.1  Treatment: Steroids -IV Decadron 6 mg daily for 10 days starting 12/18 Remdesivir -Per pharmacy consult for 5 days starting 12/18  Oxygen supplementation: SpO2: 94 % O2 Flow Rate (L/min): 2 L/min  Supportive care: Fluid: None Vitamin C/zinc Albuterol Antitussives  -benzonatate Tylenol  Hypothyroidism: Continue levothyroxine.  TSH was 1.4  Hypertension: Monitor blood pressure profile.  Continue lisinopril.  If continues to trend high, will add second agent.  Sinus bradycardia: Asymptomatic.  Patient is not on any AV nodal blocking agents.  Monitor patient on telemetry.    Per RN documentation, patient's heart rate ranged from 35-47 last night.  Family stated that it was normal for her.  Mobility: Encourage ambulation Diet: Cardiac diet Fluid: No IV fluid DVT prophylaxis:  Lovenox subcu Code Status:  Full code Family Communication:  None at bedside Expected Discharge:  Hopefully home in 2 to 3 days  Consultants:    Procedures:    Antimicrobials: Anti-infectives (From admission, onward)   Start     Dose/Rate Route Frequency Ordered Stop   10/06/19 1000  remdesivir 100 mg in sodium chloride 0.9 % 100 mL IVPB  Status:  Discontinued     100 mg 200 mL/hr over 30 Minutes Intravenous Daily 10/05/19 2346 10/05/19 2348   10/06/19 1000  remdesivir 100 mg in sodium chloride 0.9 % 100 mL IVPB     100 mg 200 mL/hr over 30 Minutes Intravenous Daily 10/05/19 2114 10/10/19 0959   10/05/19 2346  remdesivir 200 mg in sodium chloride 0.9% 250 mL IVPB  Status:  Discontinued     200 mg 580 mL/hr over 30 Minutes Intravenous Once 10/05/19 2346 10/05/19 2348   10/05/19 2115  remdesivir 200 mg in sodium chloride 0.9% 250 mL IVPB     200 mg 580 mL/hr over 30 Minutes Intravenous Once 10/05/19 2114 10/05/19 2243  10/05/19 1830  azithromycin (ZITHROMAX) 500 mg in sodium chloride 0.9 % 250 mL IVPB     500 mg 250 mL/hr over 60 Minutes Intravenous  Once 10/05/19 1820 10/05/19 2115   10/05/19 1830  cefTRIAXone (ROCEPHIN) 1 g in sodium chloride 0.9 % 100 mL IVPB     1 g 200 mL/hr over 30 Minutes Intravenous  Once 10/05/19 1820 10/05/19 2046        Code Status: Full Code   Diet Order            Diet regular Room service appropriate? Yes; Fluid consistency:  Thin  Diet effective now              Infusions:  . remdesivir 100 mg in NS 100 mL 100 mg (10/06/19 1053)    Scheduled Meds: . benzonatate  100 mg Oral TID  . busPIRone  7.5 mg Oral BID  . dexamethasone  6 mg Oral Q24H  . enoxaparin (LOVENOX) injection  40 mg Subcutaneous QHS  . levothyroxine  75 mcg Oral Q0600  . lisinopril  10 mg Oral Daily  . mirtazapine  15 mg Oral QHS  . pregabalin  50 mg Oral Daily  . traZODone  100 mg Oral QHS    PRN meds: albuterol, ondansetron **OR** ondansetron (ZOFRAN) IV, polyethylene glycol   Objective: Vitals:   10/06/19 1058 10/06/19 1428  BP: (!) 155/61 (!) 158/68  Pulse: 63 (!) 55  Resp:  18  Temp:  98.2 F (36.8 C)  SpO2:  94%    Intake/Output Summary (Last 24 hours) at 10/06/2019 1459 Last data filed at 10/06/2019 1300 Gross per 24 hour  Intake 1482.61 ml  Output --  Net 1482.61 ml   Filed Weights   10/05/19 2351  Weight: 76.3 kg   Weight change:  Body mass index is 27.15 kg/m.   Physical Exam: General exam: Appears calm and comfortable.  Propped up in bed not in distress Skin: No rashes, lesions or ulcers. HEENT: Atraumatic, normocephalic, supple neck, no obvious bleeding Lungs: Clear to auscultation bilaterally CVS: Regular rate and rhythm, no murmur GI/Abd soft, nontender, nondistended, bowel sound present CNS: Alert, awake, oriented x3 Psychiatry: Mood appropriate Extremities: No pedal edema, no calf tenderness  Data Review: I have personally reviewed the laboratory data and studies available.  Recent Labs  Lab 10/05/19 1812 10/06/19 0333  WBC 9.1 5.3  NEUTROABS 7.4 4.9  HGB 12.0 11.1*  HCT 38.2 35.8*  MCV 90.1 90.2  PLT 332 263   Recent Labs  Lab 10/05/19 1812 10/06/19 0333  NA 140 141  K 3.5 3.8  CL 108 110  CO2 21* 22  GLUCOSE 96 191*  BUN 36* 31*  CREATININE 0.79 0.62  CALCIUM 8.3* 8.0*  MG  --  2.3    Lorin Glass, MD  [facility] 10/06/2019

## 2019-10-06 NOTE — Plan of Care (Signed)
Pt's HR remains Brady 35-47. (Family states this is normal for her.) Her BP was elevated @ 178/68 manually. On-call contacted and new order for Hydralazine for SBP > 180

## 2019-10-06 NOTE — Progress Notes (Addendum)
Call received from pt's son and caregiver, [name], requesting that pt not be sent home this weekend since he, [name] and his fiance are sick and will not be able to take care of pt. [name] also requested that pt be transported at the time of discharge since he will not be able  to pick pt up. Will notify  CM/CSW re his request for transportation for when pt is  discharged.  VWilliams,RN.

## 2019-10-07 LAB — COMPREHENSIVE METABOLIC PANEL
ALT: 28 U/L (ref 0–44)
AST: 25 U/L (ref 15–41)
Albumin: 2.4 g/dL — ABNORMAL LOW (ref 3.5–5.0)
Alkaline Phosphatase: 64 U/L (ref 38–126)
Anion gap: 9 (ref 5–15)
BUN: 39 mg/dL — ABNORMAL HIGH (ref 8–23)
CO2: 21 mmol/L — ABNORMAL LOW (ref 22–32)
Calcium: 8.3 mg/dL — ABNORMAL LOW (ref 8.9–10.3)
Chloride: 111 mmol/L (ref 98–111)
Creatinine, Ser: 0.84 mg/dL (ref 0.44–1.00)
GFR calc Af Amer: >60 mL/min (ref >60)
GFR calc non Af Amer: >60 mL/min (ref >60)
Glucose, Bld: 121 mg/dL — ABNORMAL HIGH (ref 70–99)
Potassium: 3.9 mmol/L (ref 3.5–5.1)
Sodium: 141 mmol/L (ref 135–145)
Total Bilirubin: 0.5 mg/dL (ref 0.3–1.2)
Total Protein: 5.8 g/dL — ABNORMAL LOW (ref 6.5–8.1)

## 2019-10-07 LAB — CBC WITH DIFFERENTIAL/PLATELET
Abs Immature Granulocytes: 0.07 10*3/uL (ref 0.00–0.07)
Basophils Absolute: 0 10*3/uL (ref 0.0–0.1)
Basophils Relative: 0 %
Eosinophils Absolute: 0 10*3/uL (ref 0.0–0.5)
Eosinophils Relative: 0 %
HCT: 35 % — ABNORMAL LOW (ref 36.0–46.0)
Hemoglobin: 11 g/dL — ABNORMAL LOW (ref 12.0–15.0)
Immature Granulocytes: 1 %
Lymphocytes Relative: 7 %
Lymphs Abs: 0.6 10*3/uL — ABNORMAL LOW (ref 0.7–4.0)
MCH: 28.5 pg (ref 26.0–34.0)
MCHC: 31.4 g/dL (ref 30.0–36.0)
MCV: 90.7 fL (ref 80.0–100.0)
Monocytes Absolute: 0.9 10*3/uL (ref 0.1–1.0)
Monocytes Relative: 11 %
Neutro Abs: 6.9 10*3/uL (ref 1.7–7.7)
Neutrophils Relative %: 81 %
Platelets: 306 10*3/uL (ref 150–400)
RBC: 3.86 MIL/uL — ABNORMAL LOW (ref 3.87–5.11)
RDW: 14.5 % (ref 11.5–15.5)
WBC: 8.4 10*3/uL (ref 4.0–10.5)
nRBC: 0 % (ref 0.0–0.2)

## 2019-10-07 LAB — MAGNESIUM: Magnesium: 2.4 mg/dL (ref 1.7–2.4)

## 2019-10-07 LAB — D-DIMER, QUANTITATIVE: D-Dimer, Quant: 3.74 ug/mL-FEU — ABNORMAL HIGH (ref 0.00–0.50)

## 2019-10-07 LAB — FERRITIN: Ferritin: 311 ng/mL — ABNORMAL HIGH (ref 11–307)

## 2019-10-07 LAB — C-REACTIVE PROTEIN: CRP: 3.7 mg/dL — ABNORMAL HIGH (ref <1.0)

## 2019-10-07 MED ORDER — AMLODIPINE BESYLATE 5 MG PO TABS
2.5000 mg | ORAL_TABLET | Freq: Every day | ORAL | Status: DC
  Administered 2019-10-07 – 2019-10-10 (×4): 2.5 mg via ORAL
  Filled 2019-10-07 (×4): qty 1

## 2019-10-07 NOTE — Progress Notes (Signed)
PROGRESS NOTE  Alexis Cortez  DOB: 07/28/1931  PCP: Jettie Booze, NP DJS:[id number]  DOA: 10/05/2019  LOS: 2 days   Chief complaint:   Brief narrative: Alexis Cortez is a 85 y.o. female with medical history significant of hypertension, hypothyroidism and obesity. Patient was at her baseline until 09/29/2019 when she was initially diagnosed with COVID-19.  In the past few days, patient has been experiencing intermittent abdominal discomfort about 5/10, aching, nonradiating with no specific relieving or aggravating factor. There was associated anorexia, nausea and general malaise. Based on her worsening symptoms, she came to the ED on 12/18 for further evaluation.  In the ED, she had a blood pressure of 173/66, pulse of 47, respiratory 22.   Patient reportedly had episodes of desaturation and EMS had to titrate her to 4 L/min oxygen. Chest x-ray revealed right upper lobe opacity suspicious for pneumonia and bibasilar opacities. COVID-19 PCR positive  Subjective: Patient was seen and examined this morning.  Elderly Caucasian female.  Propped up in bed.  Not in distress.  Continues to feel weak but feels better than at presentation.  On 2 L oxygen via nasal cannula.   Assessment/Plan: COVID pneumonia: -Presented with generalized malaise, abdominal discomfort -Chest x-ray showed right upper lobe opacity and bibasilar opacities -Clinically improving.  She has been ferritin trending down.  D-dimer remains elevated.  Treatment: Steroids -IV Decadron 6 mg daily for 10 days starting 12/18 Remdesivir -Per pharmacy consult for 5 days starting 12/18  Supportive care: Vitamin C/zinc Albuterol Antitussives -benzonatate Tylenol    Component Value Date   SARSCOV2NAA POSITIVE (A) 10/05/2019    Recent Labs  Lab 10/05/19 1812 10/06/19 0333 10/07/19 0334  WBC 9.1 5.3 8.4   Recent Labs    10/05/19 1812 10/05/19 1814 10/06/19 0333 10/07/19 0334  DDIMER 2.12*  --  3.03* 3.74*    FERRITIN  --  375* 373* 311*  LDH 261*  --   --   --   CRP  --  7.9* 6.5* 3.7*   Procalcitonin less than 0.1  Hypothyroidism: Continue levothyroxine.  TSH was 1.4  Hypertension:  Continues to remain elevated on lisinopril.  I will also add amlodipine 2.5 mg daily.  Continue to monitor blood pressure.  Sinus bradycardia: Asymptomatic.  Patient is not on any AV nodal blocking agents.  Monitor patient on telemetry.  Per family, it is normal for her to have bradycardia at night.  Mobility: Encourage ambulation.  PT eval ordered. Diet: Cardiac diet Fluid: No IV fluid DVT prophylaxis:  Lovenox subcu Code Status:  Full code Family Communication:  None at bedside Expected Discharge:  Hopefully home in 2 to 3 days  Consultants:    Procedures:    Antimicrobials: Anti-infectives (From admission, onward)   Start     Dose/Rate Route Frequency Ordered Stop   10/06/19 1000  remdesivir 100 mg in sodium chloride 0.9 % 100 mL IVPB  Status:  Discontinued     100 mg 200 mL/hr over 30 Minutes Intravenous Daily 10/05/19 2346 10/05/19 2348   10/06/19 1000  remdesivir 100 mg in sodium chloride 0.9 % 100 mL IVPB     100 mg 200 mL/hr over 30 Minutes Intravenous Daily 10/05/19 2114 10/10/19 0959   10/05/19 2346  remdesivir 200 mg in sodium chloride 0.9% 250 mL IVPB  Status:  Discontinued     200 mg 580 mL/hr over 30 Minutes Intravenous Once 10/05/19 2346 10/05/19 2348   10/05/19 2115  remdesivir 200 mg in  sodium chloride 0.9% 250 mL IVPB     200 mg 580 mL/hr over 30 Minutes Intravenous Once 10/05/19 2114 10/05/19 2243   10/05/19 1830  azithromycin (ZITHROMAX) 500 mg in sodium chloride 0.9 % 250 mL IVPB     500 mg 250 mL/hr over 60 Minutes Intravenous  Once 10/05/19 1820 10/05/19 2115   10/05/19 1830  cefTRIAXone (ROCEPHIN) 1 g in sodium chloride 0.9 % 100 mL IVPB     1 g 200 mL/hr over 30 Minutes Intravenous  Once 10/05/19 1820 10/05/19 2046        Code Status: Full Code   Diet Order             Diet regular Room service appropriate? Yes; Fluid consistency: Thin  Diet effective now              Infusions:  . remdesivir 100 mg in NS 100 mL 100 mg (10/07/19 0956)    Scheduled Meds: . amLODipine  2.5 mg Oral Daily  . benzonatate  100 mg Oral TID  . busPIRone  7.5 mg Oral BID  . dexamethasone  6 mg Oral Q24H  . enoxaparin (LOVENOX) injection  40 mg Subcutaneous QHS  . levothyroxine  75 mcg Oral Q0600  . lisinopril  10 mg Oral Daily  . mirtazapine  15 mg Oral QHS  . pregabalin  50 mg Oral Daily  . traZODone  100 mg Oral QHS    PRN meds: albuterol, ondansetron **OR** ondansetron (ZOFRAN) IV, polyethylene glycol   Objective: Vitals:   10/06/19 2010 10/07/19 0444  BP: (!) 142/98 (!) 159/75  Pulse: (!) 54 (!) 54  Resp: 18 18  Temp: 98.3 F (36.8 C) 97.9 F (36.6 C)  SpO2: 95% 95%    Intake/Output Summary (Last 24 hours) at 10/07/2019 1539 Last data filed at 10/07/2019 0900 Gross per 24 hour  Intake 160 ml  Output --  Net 160 ml   Filed Weights   10/05/19 2351  Weight: 76.3 kg   Weight change:  Body mass index is 27.15 kg/m.   Physical Exam: General exam: Appears calm and comfortable.  Propped up in bed not in distress Skin: No rashes, lesions or ulcers. HEENT: Atraumatic, normocephalic, supple neck, no obvious bleeding Lungs: Clear to auscultation bilaterally CVS: Regular rate and rhythm, no murmur GI/Abd soft, nontender, nondistended, bowel sound present CNS: Alert, awake, oriented x3 Psychiatry: Mood appropriate Extremities: No pedal edema, no calf tenderness  Data Review: I have personally reviewed the laboratory data and studies available.  Recent Labs  Lab 10/05/19 1812 10/06/19 0333 10/07/19 0334  WBC 9.1 5.3 8.4  NEUTROABS 7.4 4.9 6.9  HGB 12.0 11.1* 11.0*  HCT 38.2 35.8* 35.0*  MCV 90.1 90.2 90.7  PLT 332 263 306   Recent Labs  Lab 10/05/19 1812 10/06/19 0333 10/07/19 0334  NA 140 141 141  K 3.5 3.8 3.9  CL 108  110 111  CO2 21* 22 21*  GLUCOSE 96 191* 121*  BUN 36* 31* 39*  CREATININE 0.79 0.62 0.84  CALCIUM 8.3* 8.0* 8.3*  MG  --  2.3 2.4    Lorin Glass, MD  [facility] 10/07/2019

## 2019-10-08 LAB — COMPREHENSIVE METABOLIC PANEL
ALT: 26 U/L (ref 0–44)
AST: 22 U/L (ref 15–41)
Albumin: 2.5 g/dL — ABNORMAL LOW (ref 3.5–5.0)
Alkaline Phosphatase: 58 U/L (ref 38–126)
Anion gap: 7 (ref 5–15)
BUN: 38 mg/dL — ABNORMAL HIGH (ref 8–23)
CO2: 22 mmol/L (ref 22–32)
Calcium: 8.1 mg/dL — ABNORMAL LOW (ref 8.9–10.3)
Chloride: 110 mmol/L (ref 98–111)
Creatinine, Ser: 0.66 mg/dL (ref 0.44–1.00)
GFR calc Af Amer: >60 mL/min (ref >60)
GFR calc non Af Amer: >60 mL/min (ref >60)
Glucose, Bld: 142 mg/dL — ABNORMAL HIGH (ref 70–99)
Potassium: 4.2 mmol/L (ref 3.5–5.1)
Sodium: 139 mmol/L (ref 135–145)
Total Bilirubin: 0.8 mg/dL (ref 0.3–1.2)
Total Protein: 5.5 g/dL — ABNORMAL LOW (ref 6.5–8.1)

## 2019-10-08 LAB — CBC WITH DIFFERENTIAL/PLATELET
Abs Immature Granulocytes: 0.06 10*3/uL (ref 0.00–0.07)
Basophils Absolute: 0 10*3/uL (ref 0.0–0.1)
Basophils Relative: 0 %
Eosinophils Absolute: 0 10*3/uL (ref 0.0–0.5)
Eosinophils Relative: 0 %
HCT: 34.5 % — ABNORMAL LOW (ref 36.0–46.0)
Hemoglobin: 10.9 g/dL — ABNORMAL LOW (ref 12.0–15.0)
Immature Granulocytes: 1 %
Lymphocytes Relative: 4 %
Lymphs Abs: 0.3 10*3/uL — ABNORMAL LOW (ref 0.7–4.0)
MCH: 28.5 pg (ref 26.0–34.0)
MCHC: 31.6 g/dL (ref 30.0–36.0)
MCV: 90.1 fL (ref 80.0–100.0)
Monocytes Absolute: 0.4 10*3/uL (ref 0.1–1.0)
Monocytes Relative: 5 %
Neutro Abs: 7.7 10*3/uL (ref 1.7–7.7)
Neutrophils Relative %: 90 %
Platelets: 264 10*3/uL (ref 150–400)
RBC: 3.83 MIL/uL — ABNORMAL LOW (ref 3.87–5.11)
RDW: 14.4 % (ref 11.5–15.5)
WBC: 8.6 10*3/uL (ref 4.0–10.5)
nRBC: 0.2 % (ref 0.0–0.2)

## 2019-10-08 LAB — C-REACTIVE PROTEIN: CRP: 1.9 mg/dL — ABNORMAL HIGH (ref <1.0)

## 2019-10-08 LAB — FERRITIN: Ferritin: 299 ng/mL (ref 11–307)

## 2019-10-08 LAB — D-DIMER, QUANTITATIVE: D-Dimer, Quant: 7.72 ug/mL-FEU — ABNORMAL HIGH (ref 0.00–0.50)

## 2019-10-08 LAB — MAGNESIUM: Magnesium: 2.2 mg/dL (ref 1.7–2.4)

## 2019-10-08 MED ORDER — ENOXAPARIN SODIUM 40 MG/0.4ML ~~LOC~~ SOLN
40.0000 mg | Freq: Two times a day (BID) | SUBCUTANEOUS | Status: DC
  Administered 2019-10-08 – 2019-10-10 (×6): 40 mg via SUBCUTANEOUS
  Filled 2019-10-08 (×6): qty 0.4

## 2019-10-08 NOTE — Care Management Important Message (Signed)
Important Message  Patient Details IM Letter given to Rhea Pink SW to present to the Patient Name: Alexis Cortez MRN: [id number] Date of Birth: 03/16/1931   Medicare Important Message Given:  Yes     Kerin Salen 10/08/2019, 12:24 PM

## 2019-10-08 NOTE — Progress Notes (Signed)
PROGRESS NOTE  Alexis Cortez  DOB: 07-25-1931  PCP: April Manson, NP GYF:[id number]  DOA: 10/05/2019  LOS: 3 days   Chief complaint:   Brief narrative: Alexis Cortez is a 85 y.o. female with medical history significant for hypertension, hypothyroidism and obesity. Patient was at her baseline until 09/29/2019 when she was initially diagnosed with COVID-19.  In the past few days, patient has been experiencing intermittent abdominal discomfort about 5/10, aching, nonradiating with no specific relieving or aggravating factor. There was associated anorexia, nausea and general malaise. Based on her worsening symptoms, she came to the ED on 12/18 for further evaluation.  In the ED, she had a blood pressure of 173/66, pulse of 47, respiratory 22.   Patient reportedly had episodes of desaturation and EMS had to titrate her to 4 L/min oxygen. Chest x-ray revealed right upper lobe opacity suspicious for pneumonia and bibasilar opacities. COVID-19 PCR positive  Subjective: Patient was seen and examined this morning.  Elderly Caucasian female.  Sitting up in chair.  Feels tired.  On 2 L oxygen by nasal cannula.  Labs improving.  Weakness is her biggest complaint.  Worked with physical therapy this morning.  Assessment/Plan: COVID pneumonia Acute respiratory with hypoxia -Presented with generalized malaise, abdominal discomfort -Chest x-ray showed right upper lobe opacity and bibasilar opacities -Covid PCR positive -Treatment: Steroids -IV Decadron 6 mg daily for 10 days to complete on 12/27, remdesivir infusion for 5 days to complete on 12/22. -Supportive care: Vitamin C, zinc, albuterol, benzonatate, Tylenol -Currently on 2 L oxygen by nasal cannula  Lab Results  Component Value Date   SARSCOV2NAA POSITIVE (A) 10/05/2019    Recent Labs  Lab 10/05/19 1812 10/06/19 0333 10/07/19 0334 10/08/19 0336  WBC 9.1 5.3 8.4 8.6   Recent Labs    10/05/19 1812 10/06/19 0333 10/07/19 0334  10/08/19 0336  DDIMER 2.12* 3.03* 3.74* 7.72*  FERRITIN  --  373* 311* 299  LDH 261*  --   --   --   CRP  --  6.5* 3.7* 1.9*   Hypothyroidism: Continue levothyroxine. TSH was 1.4  Hypertension: Continues to remain elevated on lisinopril. I also added amlodipine 2.5 mg daily.  Continue to monitor blood pressure.  Sinus bradycardia: Asymptomatic. Patient is not on any AV nodal blocking agents.  Monitor patient on telemetry.  Per family, it is normal for her to have bradycardia at night.  Mobility: Encourage ambulation.  PT eval ordered. Diet: Cardiac diet Fluid: No IV fluid DVT prophylaxis:  Lovenox subcu Code Status:  Full code Family Communication:  None at bedside Expected Discharge:  To complete remdesivir tomorrow morning.  Plan to discharge home with home health.  Consultants:    Procedures:    Antimicrobials: Anti-infectives (From admission, onward)   Start     Dose/Rate Route Frequency Ordered Stop   10/06/19 1000  remdesivir 100 mg in sodium chloride 0.9 % 100 mL IVPB  Status:  Discontinued     100 mg 200 mL/hr over 30 Minutes Intravenous Daily 10/05/19 2346 10/05/19 2348   10/06/19 1000  remdesivir 100 mg in sodium chloride 0.9 % 100 mL IVPB     100 mg 200 mL/hr over 30 Minutes Intravenous Daily 10/05/19 2114 10/10/19 0959   10/05/19 2346  remdesivir 200 mg in sodium chloride 0.9% 250 mL IVPB  Status:  Discontinued     200 mg 580 mL/hr over 30 Minutes Intravenous Once 10/05/19 2346 10/05/19 2348   10/05/19 2115  remdesivir 200  mg in sodium chloride 0.9% 250 mL IVPB     200 mg 580 mL/hr over 30 Minutes Intravenous Once 10/05/19 2114 10/05/19 2243   10/05/19 1830  azithromycin (ZITHROMAX) 500 mg in sodium chloride 0.9 % 250 mL IVPB     500 mg 250 mL/hr over 60 Minutes Intravenous  Once 10/05/19 1820 10/05/19 2115   10/05/19 1830  cefTRIAXone (ROCEPHIN) 1 g in sodium chloride 0.9 % 100 mL IVPB     1 g 200 mL/hr over 30 Minutes Intravenous  Once 10/05/19 1820  10/05/19 2046        Code Status: Full Code   Diet Order            Diet regular Room service appropriate? Yes; Fluid consistency: Thin  Diet effective now              Infusions:  . remdesivir 100 mg in NS 100 mL 100 mg (10/08/19 1005)    Scheduled Meds: . amLODipine  2.5 mg Oral Daily  . benzonatate  100 mg Oral TID  . busPIRone  7.5 mg Oral BID  . dexamethasone  6 mg Oral Q24H  . enoxaparin (LOVENOX) injection  40 mg Subcutaneous Q12H  . levothyroxine  75 mcg Oral Q0600  . lisinopril  10 mg Oral Daily  . mirtazapine  15 mg Oral QHS  . pregabalin  50 mg Oral Daily  . traZODone  100 mg Oral QHS    PRN meds: albuterol, ondansetron **OR** ondansetron (ZOFRAN) IV, polyethylene glycol   Objective: Vitals:   10/08/19 0545 10/08/19 1312  BP: (!) 157/92 (!) 148/58  Pulse: (!) 47 (!) 54  Resp: 18   Temp: 98.3 F (36.8 C) 98 F (36.7 C)  SpO2: 92% 95%    Intake/Output Summary (Last 24 hours) at 10/08/2019 1400 Last data filed at 10/08/2019 1100 Gross per 24 hour  Intake 330 ml  Output --  Net 330 ml   Filed Weights   10/05/19 2351  Weight: 76.3 kg   Weight change:  Body mass index is 27.15 kg/m.   Physical Exam: General exam: Appears calm and comfortable.  Sitting up in chair.  Not in distress.   Skin: No rashes, lesions or ulcers. HEENT: Atraumatic, normocephalic, supple neck, no obvious bleeding Lungs: Clear to auscultation bilaterally CVS: Regular rate and rhythm, no murmur GI/Abd soft, nontender, nondistended, bowel sound present CNS: Alert, awake, oriented x3 Psychiatry: Mood appropriate Extremities: No pedal edema, no calf tenderness  Data Review: I have personally reviewed the laboratory data and studies available.  Recent Labs  Lab 10/05/19 1812 10/06/19 0333 10/07/19 0334 10/08/19 0336  WBC 9.1 5.3 8.4 8.6  NEUTROABS 7.4 4.9 6.9 7.7  HGB 12.0 11.1* 11.0* 10.9*  HCT 38.2 35.8* 35.0* 34.5*  MCV 90.1 90.2 90.7 90.1  PLT 332 263 306  264   Recent Labs  Lab 10/05/19 1812 10/06/19 0333 10/07/19 0334 10/08/19 0336  NA 140 141 141 139  K 3.5 3.8 3.9 4.2  CL 108 110 111 110  CO2 21* 22 21* 22  GLUCOSE 96 191* 121* 142*  BUN 36* 31* 39* 38*  CREATININE 0.79 0.62 0.84 0.66  CALCIUM 8.3* 8.0* 8.3* 8.1*  MG  --  2.3 2.4 2.2    Terrilee Croak, MD  [facility] 10/08/2019

## 2019-10-08 NOTE — Evaluation (Signed)
Physical Therapy Evaluation Patient Details Name: Alexis Cortez MRN: [id number] DOB: [birth year] Today's Date: 10/08/2019   History of Present Illness  85 yo female admitted with COVID Pna, sinus bradycardia. Hx of obesity, macular degeneration  Clinical Impression  On eval, pt required Min assist for mobility. She was able to stand and pivot to bsc then take a few ambulatory steps to recliner using a RW. Pt is weak and fatigues easily. She seems confused at times. Per chart, son Trey Paula also has Covid. Recommend HHPT if family feels they can care for pt. If not, she will need SNF. Will follow during hospital stay.     Follow Up Recommendations Home health PT;Supervision/Assistance - 24 hour vs SNF(depending on progress and care available at home)    Equipment Recommendations  None recommended by PT    Recommendations for Other Services       Precautions / Restrictions Precautions Precautions: Fall Precaution Comments: monitor O2 Restrictions Weight Bearing Restrictions: No      Mobility  Bed Mobility Overal bed mobility: Needs Assistance Bed Mobility: Supine to Sit     Supine to sit: Min assist;HOB elevated     General bed mobility comments: Assist for trunk and to scoot to EOB. Cues for safety. Increased time.  Transfers Overall transfer level: Needs assistance Equipment used: Rolling walker (2 wheeled) Transfers: Sit to/from Stand Sit to Stand: Min assist         General transfer comment: Assist to rise, stabilize, control descent. Stand pivot, bed to bsc, using RW. Unsteady.  Ambulation/Gait Ambulation/Gait assistance: Min assist Gait Distance (Feet): 3 Feet Assistive device: Rolling walker (2 wheeled) Gait Pattern/deviations: Step-through pattern;Decreased stride length     General Gait Details: Pt took a few steps from bsc to recliner using RW. O2 sat dropped to 83% on RA, dyspnea 2/4.  Stairs            Wheelchair Mobility    Modified Rankin  (Stroke Patients Only)       Balance Overall balance assessment: Needs assistance         Standing balance support: Bilateral upper extremity supported Standing balance-Leahy Scale: Poor                               Pertinent Vitals/Pain Pain Assessment: No/denies pain    Home Living Family/patient expects to be discharged to:: Private residence Living Arrangements: Children Available Help at Discharge: Family             Additional Comments: pt does not appear to be a great historian. She stated "I don't know" to several questions    Prior Function Level of Independence: Needs assistance   Gait / Transfers Assistance Needed: uses RW?           Hand Dominance        Extremity/Trunk Assessment   Upper Extremity Assessment Upper Extremity Assessment: Generalized weakness    Lower Extremity Assessment Lower Extremity Assessment: Generalized weakness    Cervical / Trunk Assessment Cervical / Trunk Assessment: Normal  Communication   Communication: No difficulties  Cognition Arousal/Alertness: Awake/alert Behavior During Therapy: WFL for tasks assessed/performed Overall Cognitive Status: No family/caregiver present to determine baseline cognitive functioning                                        General  Comments      Exercises     Assessment/Plan    PT Assessment Patient needs continued PT services  PT Problem List Decreased strength;Decreased mobility;Decreased balance;Decreased activity tolerance;Decreased knowledge of use of DME;Decreased cognition;Decreased safety awareness       PT Treatment Interventions DME instruction;Gait training;Therapeutic exercise;Therapeutic activities;Patient/family education;Balance training;Functional mobility training    PT Goals (Current goals can be found in the Care Plan section)  Acute Rehab PT Goals Patient Stated Goal: none stated PT Goal Formulation: Patient unable to  participate in goal setting Time For Goal Achievement: 10/22/19 Potential to Achieve Goals: Good    Frequency Min 3X/week   Barriers to discharge        Co-evaluation               AM-PAC PT "6 Clicks" Mobility  Outcome Measure Help needed turning from your back to your side while in a flat bed without using bedrails?: A Little Help needed moving from lying on your back to sitting on the side of a flat bed without using bedrails?: A Little Help needed moving to and from a bed to a chair (including a wheelchair)?: A Little Help needed standing up from a chair using your arms (e.g., wheelchair or bedside chair)?: A Little Help needed to walk in hospital room?: A Little Help needed climbing 3-5 steps with a railing? : A Lot 6 Click Score: 17    End of Session   Activity Tolerance: Patient limited by fatigue Patient left: in chair;with call bell/phone within reach;with chair alarm set   PT Visit Diagnosis: Unsteadiness on feet (R26.81);Muscle weakness (generalized) (M62.81)    Time: 3220-2542 PT Time Calculation (min) (ACUTE ONLY): 24 min   Charges:   PT Evaluation $PT Eval Moderate Complexity: 1 Mod PT Treatments $Gait Training: 8-22 mins          Doreatha Massed, PT Acute Rehabilitation Services

## 2019-10-08 NOTE — Progress Notes (Signed)
Per NT, while transferring the patient from the chair back into the bed, she stood up and after standing for only a few seconds her O2 sat dropped to 85% on room air. The NT sat the patient down in bed and put her on 2L O2 via nasal cannula. Her O2 rose to 95% after a couple minutes.   I spoke with the patient earlier in the day and she stated she is agreeable to SNF at the time of discharge if her son is too sick himself to care for her at home.

## 2019-10-09 LAB — COMPREHENSIVE METABOLIC PANEL
ALT: 24 U/L (ref 0–44)
AST: 24 U/L (ref 15–41)
Albumin: 2.4 g/dL — ABNORMAL LOW (ref 3.5–5.0)
Alkaline Phosphatase: 63 U/L (ref 38–126)
Anion gap: 7 (ref 5–15)
BUN: 34 mg/dL — ABNORMAL HIGH (ref 8–23)
CO2: 22 mmol/L (ref 22–32)
Calcium: 7.9 mg/dL — ABNORMAL LOW (ref 8.9–10.3)
Chloride: 106 mmol/L (ref 98–111)
Creatinine, Ser: 0.7 mg/dL (ref 0.44–1.00)
GFR calc Af Amer: >60 mL/min (ref >60)
GFR calc non Af Amer: >60 mL/min (ref >60)
Glucose, Bld: 145 mg/dL — ABNORMAL HIGH (ref 70–99)
Potassium: 4.5 mmol/L (ref 3.5–5.1)
Sodium: 135 mmol/L (ref 135–145)
Total Bilirubin: 0.8 mg/dL (ref 0.3–1.2)
Total Protein: 5.3 g/dL — ABNORMAL LOW (ref 6.5–8.1)

## 2019-10-09 LAB — CBC WITH DIFFERENTIAL/PLATELET
Abs Immature Granulocytes: 0.12 10*3/uL — ABNORMAL HIGH (ref 0.00–0.07)
Basophils Absolute: 0 10*3/uL (ref 0.0–0.1)
Basophils Relative: 0 %
Eosinophils Absolute: 0 10*3/uL (ref 0.0–0.5)
Eosinophils Relative: 0 %
HCT: 36.9 % (ref 36.0–46.0)
Hemoglobin: 11.5 g/dL — ABNORMAL LOW (ref 12.0–15.0)
Immature Granulocytes: 1 %
Lymphocytes Relative: 3 %
Lymphs Abs: 0.3 10*3/uL — ABNORMAL LOW (ref 0.7–4.0)
MCH: 28.3 pg (ref 26.0–34.0)
MCHC: 31.2 g/dL (ref 30.0–36.0)
MCV: 90.9 fL (ref 80.0–100.0)
Monocytes Absolute: 0.7 10*3/uL (ref 0.1–1.0)
Monocytes Relative: 6 %
Neutro Abs: 10.1 10*3/uL — ABNORMAL HIGH (ref 1.7–7.7)
Neutrophils Relative %: 90 %
Platelets: 276 10*3/uL (ref 150–400)
RBC: 4.06 MIL/uL (ref 3.87–5.11)
RDW: 14.3 % (ref 11.5–15.5)
WBC: 11.2 10*3/uL — ABNORMAL HIGH (ref 4.0–10.5)
nRBC: 0 % (ref 0.0–0.2)

## 2019-10-09 LAB — D-DIMER, QUANTITATIVE: D-Dimer, Quant: 7.15 ug/mL-FEU — ABNORMAL HIGH (ref 0.00–0.50)

## 2019-10-09 LAB — C-REACTIVE PROTEIN: CRP: 1.1 mg/dL — ABNORMAL HIGH (ref <1.0)

## 2019-10-09 LAB — FERRITIN: Ferritin: 324 ng/mL — ABNORMAL HIGH (ref 11–307)

## 2019-10-09 LAB — MAGNESIUM: Magnesium: 2.2 mg/dL (ref 1.7–2.4)

## 2019-10-09 NOTE — Progress Notes (Signed)
PROGRESS NOTE  Alexis Cortez  DOB: Feb 27, 1931  PCP: April Manson, NP JGO:[id number]  DOA: 10/05/2019  LOS: 4 days   Chief complaint:   Brief narrative: Alexis Cortez is a 85 y.o. female with medical history significant for hypertension, hypothyroidism and obesity. Patient was at her baseline until 09/29/2019 when she was initially diagnosed with COVID-19.  In the past few days, patient has been experiencing intermittent abdominal discomfort about 5/10, aching, nonradiating with no specific relieving or aggravating factor. There was associated anorexia, nausea and general malaise. Based on her worsening symptoms, she came to the ED on 12/18 for further evaluation.  In the ED, she had a blood pressure of 173/66, pulse of 47, respiratory 22.   Patient reportedly had episodes of desaturation and EMS had to titrate her to 4 L/min oxygen. Chest x-ray revealed right upper lobe opacity suspicious for pneumonia and bibasilar opacities. COVID-19 PCR positive  Subjective: Patient was seen and examined this morning.  Elderly Caucasian female.  Sitting up in bed. On 2 L oxygen by nasal cannula.  Labs improving.  Weakness is her biggest complaint.   Not in distress.  Assessment/Plan: COVID pneumonia Acute respiratory with hypoxia -Presented with generalized malaise, abdominal discomfort -Chest x-ray showed right upper lobe opacity and bibasilar opacities -Covid PCR positive -Treatment: Steroids -IV Decadron 6 mg daily for 10 days to complete on 12/27, remdesivir infusion 5-day course completed today on 12/22.   -Supportive care: Vitamin C, zinc, albuterol, benzonatate, Tylenol -Currently on 2 L oxygen by nasal cannula.  Wean down as tolerated.  Lab Results  Component Value Date   SARSCOV2NAA POSITIVE (A) 10/05/2019    Recent Labs  Lab 10/05/19 1812 10/06/19 0333 10/07/19 0334 10/08/19 0336 10/09/19 0351  WBC 9.1 5.3 8.4 8.6 11.2*   Recent Labs    10/07/19 0334 10/08/19 0336  10/09/19 0351  DDIMER 3.74* 7.72* 7.15*  FERRITIN 311* 299 324*  CRP 3.7* 1.9* 1.1*   Hypothyroidism: Continue levothyroxine. TSH was 1.4  Hypertension: Continues to remain elevated on lisinopril. I also added amlodipine 2.5 mg daily.  Continue to monitor blood pressure.  Sinus bradycardia: Asymptomatic. Patient is not on any AV nodal blocking agents.  Monitor patient on telemetry.  Per family, it is normal for her to have bradycardia at night.  Impaired mobility - patient lives at home with her son Trey Paula.  Trey Paula himself is sick from [place] and unable to take care of his mom at this time.  SNF recommended.  Diet: Cardiac diet Fluid: No IV fluid DVT prophylaxis:  Lovenox subcu Code Status:  Full code Family Communication:  None at bedside Expected Discharge:  Medically stable for discharge.  Pending SNF. Consultants:    Procedures:    Antimicrobials: Anti-infectives (From admission, onward)   Start     Dose/Rate Route Frequency Ordered Stop   10/06/19 1000  remdesivir 100 mg in sodium chloride 0.9 % 100 mL IVPB  Status:  Discontinued     100 mg 200 mL/hr over 30 Minutes Intravenous Daily 10/05/19 2346 10/05/19 2348   10/06/19 1000  remdesivir 100 mg in sodium chloride 0.9 % 100 mL IVPB     100 mg 200 mL/hr over 30 Minutes Intravenous Daily 10/05/19 2114 10/09/19 1015   10/05/19 2346  remdesivir 200 mg in sodium chloride 0.9% 250 mL IVPB  Status:  Discontinued     200 mg 580 mL/hr over 30 Minutes Intravenous Once 10/05/19 2346 10/05/19 2348   10/05/19 2115  remdesivir  200 mg in sodium chloride 0.9% 250 mL IVPB     200 mg 580 mL/hr over 30 Minutes Intravenous Once 10/05/19 2114 10/05/19 2243   10/05/19 1830  azithromycin (ZITHROMAX) 500 mg in sodium chloride 0.9 % 250 mL IVPB     500 mg 250 mL/hr over 60 Minutes Intravenous  Once 10/05/19 1820 10/05/19 2115   10/05/19 1830  cefTRIAXone (ROCEPHIN) 1 g in sodium chloride 0.9 % 100 mL IVPB     1 g 200 mL/hr over 30 Minutes  Intravenous  Once 10/05/19 1820 10/05/19 2046        Code Status: Full Code   Diet Order            Diet regular Room service appropriate? Yes; Fluid consistency: Thin  Diet effective now              Infusions:    Scheduled Meds: . amLODipine  2.5 mg Oral Daily  . benzonatate  100 mg Oral TID  . busPIRone  7.5 mg Oral BID  . dexamethasone  6 mg Oral Q24H  . enoxaparin (LOVENOX) injection  40 mg Subcutaneous Q12H  . levothyroxine  75 mcg Oral Q0600  . lisinopril  10 mg Oral Daily  . mirtazapine  15 mg Oral QHS  . pregabalin  50 mg Oral Daily  . traZODone  100 mg Oral QHS    PRN meds: albuterol, ondansetron **OR** ondansetron (ZOFRAN) IV, polyethylene glycol   Objective: Vitals:   10/09/19 0527 10/09/19 1309  BP: (!) 154/72 (!) 159/75  Pulse: (!) 51 74  Resp: 18 20  Temp: 98 F (36.7 C) 98.3 F (36.8 C)  SpO2: 97% 91%    Intake/Output Summary (Last 24 hours) at 10/09/2019 1519 Last data filed at 10/09/2019 1300 Gross per 24 hour  Intake 840 ml  Output 600 ml  Net 240 ml   Filed Weights   10/05/19 2351  Weight: 76.3 kg   Weight change:  Body mass index is 27.15 kg/m.   Physical Exam: General exam: Appears calm and comfortable.  Sitting up in bed.  Not in distress.   Skin: No rashes, lesions or ulcers. HEENT: Atraumatic, normocephalic, supple neck, no obvious bleeding Lungs: Clear to auscultation bilaterally CVS: Regular rate and rhythm, no murmur GI/Abd soft, nontender, nondistended, bowel sound present CNS: Alert, awake, oriented x3 Psychiatry: Mood appropriate Extremities: No pedal edema, no calf tenderness  Data Review: I have personally reviewed the laboratory data and studies available.  Recent Labs  Lab 10/05/19 1812 10/06/19 0333 10/07/19 0334 10/08/19 0336 10/09/19 0351  WBC 9.1 5.3 8.4 8.6 11.2*  NEUTROABS 7.4 4.9 6.9 7.7 10.1*  HGB 12.0 11.1* 11.0* 10.9* 11.5*  HCT 38.2 35.8* 35.0* 34.5* 36.9  MCV 90.1 90.2 90.7 90.1 90.9    PLT 332 263 306 264 276   Recent Labs  Lab 10/05/19 1812 10/06/19 0333 10/07/19 0334 10/08/19 0336 10/09/19 0351  NA 140 141 141 139 135  K 3.5 3.8 3.9 4.2 4.5  CL 108 110 111 110 106  CO2 21* 22 21* 22 22  GLUCOSE 96 191* 121* 142* 145*  BUN 36* 31* 39* 38* 34*  CREATININE 0.79 0.62 0.84 0.66 0.70  CALCIUM 8.3* 8.0* 8.3* 8.1* 7.9*  MG  --  2.3 2.4 2.2 2.2    Terrilee Croak, MD  [facility] 10/09/2019

## 2019-10-09 NOTE — Progress Notes (Signed)
Patient son Alexis Cortez is requesting phone call from MD and Case management on 10/10/19 with updates about his mom. You can reach him at [phone]. Thank you.

## 2019-10-10 LAB — CBC WITH DIFFERENTIAL/PLATELET
Abs Immature Granulocytes: 0.09 10*3/uL — ABNORMAL HIGH (ref 0.00–0.07)
Basophils Absolute: 0 10*3/uL (ref 0.0–0.1)
Basophils Relative: 0 %
Eosinophils Absolute: 0 10*3/uL (ref 0.0–0.5)
Eosinophils Relative: 0 %
HCT: 38.2 % (ref 36.0–46.0)
Hemoglobin: 11.7 g/dL — ABNORMAL LOW (ref 12.0–15.0)
Immature Granulocytes: 1 %
Lymphocytes Relative: 3 %
Lymphs Abs: 0.3 10*3/uL — ABNORMAL LOW (ref 0.7–4.0)
MCH: 27.9 pg (ref 26.0–34.0)
MCHC: 30.6 g/dL (ref 30.0–36.0)
MCV: 91.2 fL (ref 80.0–100.0)
Monocytes Absolute: 0.5 10*3/uL (ref 0.1–1.0)
Monocytes Relative: 5 %
Neutro Abs: 8.6 10*3/uL — ABNORMAL HIGH (ref 1.7–7.7)
Neutrophils Relative %: 91 %
Platelets: 275 10*3/uL (ref 150–400)
RBC: 4.19 MIL/uL (ref 3.87–5.11)
RDW: 14.4 % (ref 11.5–15.5)
WBC: 9.5 10*3/uL (ref 4.0–10.5)
nRBC: 0 % (ref 0.0–0.2)

## 2019-10-10 LAB — COMPREHENSIVE METABOLIC PANEL
ALT: 23 U/L (ref 0–44)
AST: 24 U/L (ref 15–41)
Albumin: 2.4 g/dL — ABNORMAL LOW (ref 3.5–5.0)
Alkaline Phosphatase: 61 U/L (ref 38–126)
Anion gap: 7 (ref 5–15)
BUN: 34 mg/dL — ABNORMAL HIGH (ref 8–23)
CO2: 21 mmol/L — ABNORMAL LOW (ref 22–32)
Calcium: 8 mg/dL — ABNORMAL LOW (ref 8.9–10.3)
Chloride: 108 mmol/L (ref 98–111)
Creatinine, Ser: 0.73 mg/dL (ref 0.44–1.00)
GFR calc Af Amer: >60 mL/min (ref >60)
GFR calc non Af Amer: >60 mL/min (ref >60)
Glucose, Bld: 139 mg/dL — ABNORMAL HIGH (ref 70–99)
Potassium: 4.8 mmol/L (ref 3.5–5.1)
Sodium: 136 mmol/L (ref 135–145)
Total Bilirubin: 0.9 mg/dL (ref 0.3–1.2)
Total Protein: 5.3 g/dL — ABNORMAL LOW (ref 6.5–8.1)

## 2019-10-10 LAB — CULTURE, BLOOD (ROUTINE X 2)
Culture: NO GROWTH
Culture: NO GROWTH
Special Requests: ADEQUATE
Special Requests: ADEQUATE

## 2019-10-10 LAB — C-REACTIVE PROTEIN: CRP: 0.9 mg/dL (ref <1.0)

## 2019-10-10 LAB — MAGNESIUM: Magnesium: 2.4 mg/dL (ref 1.7–2.4)

## 2019-10-10 LAB — D-DIMER, QUANTITATIVE: D-Dimer, Quant: 6.37 ug/mL-FEU — ABNORMAL HIGH (ref 0.00–0.50)

## 2019-10-10 LAB — FERRITIN: Ferritin: 344 ng/mL — ABNORMAL HIGH (ref 11–307)

## 2019-10-10 MED ORDER — DEXAMETHASONE 6 MG PO TABS: 6.0000 mg | ORAL_TABLET | Freq: Every day | ORAL | Status: AC

## 2019-10-10 MED ORDER — AMLODIPINE BESYLATE 5 MG PO TABS: 5.0000 mg | ORAL_TABLET | Freq: Every day | ORAL | Status: DC

## 2019-10-10 MED ORDER — AMLODIPINE BESYLATE 2.5 MG PO TABS: 2.5000 mg | ORAL_TABLET | Freq: Every day | ORAL | Status: DC

## 2019-10-10 NOTE — TOC Transition Note (Addendum)
Transition of Care [facility]) - CM/SW Discharge Note   Patient Details  Name: Alexis Cortez MRN: [id number] Date of Birth: 04/29/1931  Transition of Care [facility] CM/SW Contact:  Wende Neighbors, LCSW Phone Number: 10/10/2019, 2:15 PM   Clinical Narrative:   Patients son and his fiancee aware of discharge plan to [facility]. Patient to discharge via PTAR to [facility]. RN to call [phone] (rm# [phone]) for report.   CSW spoke with [place] via phone and Maudie Mercury is aware that patient is going to [facility] and authorization has been started by [employer]    Final next level of care: Skilled Nursing Facility Barriers to Discharge: No Barriers Identified   Patient Goals and CMS Choice        Discharge Placement              Patient chooses bed at: [facility] Patient to be transferred to facility by: ptar Name of family member notified: son and daughter in law Patient and family notified of of transfer: 10/10/19  Discharge Plan and Services                                     Social Determinants of Health ([place]) Interventions     Readmission Risk Interventions No flowsheet data found.

## 2019-10-10 NOTE — Care Management Important Message (Signed)
Important Message  Patient Details IM Letter given to [place] Case Manager to present to the Patient Name: CHENEE MUNNS MRN: [id number] Date of Birth: June 24, 1931   Medicare Important Message Given:  Yes     Kerin Salen 10/10/2019, 10:22 AM

## 2019-10-10 NOTE — NC FL2 (Signed)
[place] LEVEL OF CARE SCREENING TOOL     IDENTIFICATION  Patient Name: Alexis Cortez Birthdate: 12/22/1930 Sex: female Admission Date (Current Location): 10/05/2019  [facility] and [place] Number:  Herbalist and Address:  [facility],  [place] [address], [place]      Provider Number: [id number]  Attending Physician Name and Address:  Mendel Corning, MD  Relative Name and Phone Number:       Current Level of Care: Hospital Recommended Level of Care: [place] Prior Approval Number:    Date Approved/Denied:   PASRR Number: [id number]  Discharge Plan: SNF    Current Diagnoses: Patient Active Problem List   Diagnosis Date Noted  . Sinus bradycardia   . COVID-19 virus infection 10/05/2019    Orientation RESPIRATION BLADDER Height & Weight     Self, Time, Situation, Place  O2(2L) Incontinent, External catheter Weight: 168 lb 3.4 oz (76.3 kg) Height:  5\' 6"  (167.6 cm)  BEHAVIORAL SYMPTOMS/MOOD NEUROLOGICAL BOWEL NUTRITION STATUS      Continent    AMBULATORY STATUS COMMUNICATION OF NEEDS Skin   Limited Assist Verbally                         Personal Care Assistance Level of Assistance  Bathing, Dressing, Feeding Bathing Assistance: Limited assistance Feeding assistance: Independent Dressing Assistance: Limited assistance     Functional Limitations Info  Sight, Speech, Hearing Sight Info: Adequate Hearing Info: Adequate Speech Info: Adequate    SPECIAL CARE FACTORS FREQUENCY  PT (By licensed PT), OT (By licensed OT)     PT Frequency: 5x wk OT Frequency: 5x wk            Contractures Contractures Info: Not present    Additional Factors Info  Code Status, Isolation Precautions, Allergies Code Status Info: full code Allergies Info: no known allergies     Isolation Precautions Info: covid positive     Current Medications (10/10/2019):  This is the current hospital active  medication list Current Facility-Administered Medications  Medication Dose Route Frequency Provider Last Rate Last Admin  . albuterol (VENTOLIN HFA) 108 (90 Base) MCG/ACT inhaler 2 puff  2 puff Inhalation Q6H PRN Phineas Semen, MD      . amLODipine (NORVASC) tablet 2.5 mg  2.5 mg Oral Daily Dahal, Binaya, MD   2.5 mg at 10/09/19 0936  . benzonatate (TESSALON) capsule 100 mg  100 mg Oral TID Phineas Semen, MD   100 mg at 10/09/19 2113  . busPIRone (BUSPAR) tablet 7.5 mg  7.5 mg Oral BID Phineas Semen, MD   7.5 mg at 10/09/19 2113  . dexamethasone (DECADRON) tablet 6 mg  6 mg Oral Q24H Phineas Semen, MD   6 mg at 10/09/19 2116  . enoxaparin (LOVENOX) injection 40 mg  40 mg Subcutaneous Q12H Dahal, Marlowe Aschoff, MD   40 mg at 10/09/19 2115  . levothyroxine (SYNTHROID) tablet 75 mcg  75 mcg Oral Q0600 Phineas Semen, MD   75 mcg at 10/10/19 0643  . lisinopril (ZESTRIL) tablet 10 mg  10 mg Oral Daily Phineas Semen, MD   10 mg at 10/09/19 2542  . mirtazapine (REMERON) tablet 15 mg  15 mg Oral QHS Phineas Semen, MD   15 mg at 10/09/19 2114  . ondansetron (ZOFRAN) tablet 4 mg  4 mg Oral Q6H PRN Phineas Semen, MD  Or  . ondansetron (ZOFRAN) injection 4 mg  4 mg Intravenous Q6H PRN Leonette Nutting, MD   4 mg at 10/08/19 1449  . polyethylene glycol (MIRALAX / GLYCOLAX) packet 17 g  17 g Oral Daily PRN [place], Paula Compton, MD      . pregabalin (LYRICA) capsule 50 mg  50 mg Oral Daily Leonette Nutting, MD   50 mg at 10/09/19 0937  . traZODone (DESYREL) tablet 100 mg  100 mg Oral QHS Leonette Nutting, MD   100 mg at 10/09/19 2114     Discharge Medications: Please see discharge summary for a list of discharge medications.  Relevant Imaging Results:  Relevant Lab Results:   Additional Information SS# [id number] covid positive  Althea Charon, [place]

## 2019-10-10 NOTE — Discharge Summary (Addendum)
Physician Discharge Summary   Patient ID: Alexis Cortez MRN: [id number] DOB/AGE: [id number] 83 y.o.  Admit date: 10/05/2019 Discharge date: 10/10/2019  Primary Care Physician:  Jettie Booze, NP   Recommendations for Outpatient Follow-up:  1. Follow up with PCP in 1-2 weeks 2. Please follow D-dimer outpatient  Home Health: Patient going to skilled nursing facility Equipment/Devices:   Discharge Condition: stable  CODE STATUS: FULL Diet recommendation: Heart healthy   Discharge Diagnoses:     Acute respiratory failure with hypoxia secondary to COVID-19 . COVID-19 virus pneumonitis Hypothyroidism Hypertension Sinus bradycardia Generalized debility  Consults: None    Allergies:  No Known Allergies   DISCHARGE MEDICATIONS: Allergies as of 10/10/2019   No Known Allergies     Medication List    TAKE these medications   albuterol 108 (90 Base) MCG/ACT inhaler Commonly known as: VENTOLIN HFA Inhale 2 puffs into the lungs every 6 (six) hours as needed for wheezing or shortness of breath.   amLODipine 5 MG tablet Commonly known as: NORVASC Take 1 tablet (5 mg total) by mouth daily.   benzonatate 100 MG capsule Commonly known as: TESSALON Take 100 mg by mouth 3 (three) times daily.   busPIRone 7.5 MG tablet Commonly known as: BUSPAR Take 7.5 mg by mouth 2 (two) times daily.   [employer] Purelax 17 GM/SCOOP powder Generic drug: polyethylene glycol powder Take 17 g by mouth daily as needed for moderate constipation.   dexamethasone 6 MG tablet Commonly known as: DECADRON Take 1 tablet (6 mg total) by mouth daily for 6 days.   docusate sodium 100 MG capsule Commonly known as: COLACE Take 100 mg by mouth daily as needed for mild constipation.   eucerin cream Apply 1 application topically as needed for dry skin.   levothyroxine 75 MCG tablet Commonly known as: SYNTHROID Take 75 mcg by mouth daily.   lisinopril 10 MG tablet Commonly known as:  ZESTRIL Take 10 mg by mouth daily.   mirtazapine 15 MG tablet Commonly known as: REMERON Take 15 mg by mouth at bedtime.   Misc. Devices Misc Knee high compression stockings 18-34mmHg pressure to be worn during daytime hours and off at night Dx: peripheral edema   Multiple Vitamin tablet Take 1 tablet by mouth daily.   pregabalin 50 MG capsule Commonly known as: LYRICA Take 50 mg by mouth daily.   traZODone 100 MG tablet Commonly known as: DESYREL Take 100 mg by mouth at bedtime.        Brief H and P: For complete details please refer to admission H and P, but in briefUnita P Haydenis a 85 y.o.femalewith medical history significant for hypertension, hypothyroidism and obesity. Patient was at her baseline until 09/29/2019 when she wasinitiallydiagnosed with COVID-19.In the past few days, patient has been experiencing intermittent abdominal discomfort about 5/10, aching, nonradiating with no specific relieving or aggravating factor. There was associated anorexia, nausea and general malaise. Based on her worsening symptoms, she came to the ED on 12/18 for further evaluation.  In the ED, she had a blood pressure of 173/66, pulse of 47, respiratory 22.  Patient reportedly had episodes of desaturation and EMS had to titrate her to 4 L/minoxygen. Chest x-ray revealed right upper lobe opacity suspicious for pneumonia and bibasilar opacities. COVID-19 PCR positive  Hospital Course:    1. Acute Hypoxic Resp. Failure due to Acute Covid 19 Viral Pneumonia during the ongoing 2020 Covid 19 Pandemic - POA - Patient was hypoxic at the time of  admission, requiring 4 L O2 via nasal cannula, presented with generalized malaise, abdominal discomfort.  Chest x-ray showed right upper lobe opacity and bibasilar opacities -COVID-19 positive -Patient was placed on IV steroids, transition to oral at the time of discharge.  She completed remdesivir protocol on 10/09/2019 -Placed on supportive  care Vitamin C/zinc, albuterol, benzonatate, Tylenol. - Continue to wean oxygen, ambulatory O2 screening daily as tolerated  - Oxygen - SpO2: 95 % O2 Flow Rate (L/min): 2 L/min  -Inflammatory markers as below, CRP, D-dimer trending down  Lab Results  Component Value Date   SARSCOV2NAA POSITIVE (A) 10/05/2019     Recent Labs  Lab 10/05/19 1812 10/06/19 0333 10/07/19 0334 10/08/19 0336 10/09/19 0351 10/10/19 0307  DDIMER 2.12* 3.03* 3.74* 7.72* 7.15* 6.37*  FERRITIN  --  373* 311* 299 324* 344*  CRP  --  6.5* 3.7* 1.9* 1.1* 0.9  ALT 34 33 28 26 24 23   PROCALCITON <0.10  --   --   --   --   --      Hypothyroidism: -Continue Synthroid, TSH 1.4   Essential hypertension -Still somewhat elevated, amlodipine increased to 5 mg daily, continue lisinopril  Sinus bradycardia Asymptomatic, not on any AV nodal blocking agents  Generalized debility Lives at home with her son, who is himself sick from COVID-19 and unable to care for his mom. SNF recommended     Day of Discharge S: No acute complaints, no fevers or chills.  BP (!) 168/68 (BP Location: Left Arm)   Pulse (!) 58   Temp 98 F (36.7 C) (Oral)   Resp 16   Ht 5\' 6"  (1.676 m)   Wt 76.3 kg   SpO2 95%   BMI 27.15 kg/m   Physical Exam: General: Alert and awake oriented x3 not in any acute distress. HEENT: anicteric sclera, pupils reactive to light and accommodation CVS: S1-S2 clear no murmur rubs or gallops Chest: clear to auscultation bilaterally, no wheezing rales or rhonchi Abdomen: soft nontender, nondistended, normal bowel sounds Extremities: no cyanosis, clubbing or edema noted bilaterally Neuro: Cranial nerves II-XII intact, no focal neurological deficits   The results of significant diagnostics from this hospitalization (including imaging, microbiology, ancillary and laboratory) are listed below for reference.      Procedures/Studies:  DG Chest Port 1 View  Result Date: 10/05/2019 CLINICAL  DATA:  Acute shortness of breath EXAM: PORTABLE CHEST 1 VIEW COMPARISON:  None. FINDINGS: This is a mildly low volume film. Cardiomediastinal silhouette is unremarkable. Patchy opacities within the RIGHT UPPER lobe and bibasilar regions identified. No definite pleural effusion or pneumothorax. No acute bony abnormalities are identified. IMPRESSION: RIGHT UPPER lobe opacities suspicious for pneumonia. Recommend radiographic follow-up to resolution. Bibasilar opacities which may represent atelectasis versus airspace disease/pneumonia. Electronically Signed   By: Harmon PierJeffrey  Hu M.D.   On: 10/05/2019 17:46      LAB RESULTS: Basic Metabolic Panel: Recent Labs  Lab 10/09/19 0351 10/10/19 0307  NA 135 136  K 4.5 4.8  CL 106 108  CO2 22 21*  GLUCOSE 145* 139*  BUN 34* 34*  CREATININE 0.70 0.73  CALCIUM 7.9* 8.0*  MG 2.2 2.4   Liver Function Tests: Recent Labs  Lab 10/09/19 0351 10/10/19 0307  AST 24 24  ALT 24 23  ALKPHOS 63 61  BILITOT 0.8 0.9  PROT 5.3* 5.3*  ALBUMIN 2.4* 2.4*   No results for input(s): LIPASE, AMYLASE in the last 168 hours. No results for input(s): AMMONIA in the last  168 hours. CBC: Recent Labs  Lab 10/09/19 0351 10/10/19 0307  WBC 11.2* 9.5  NEUTROABS 10.1* 8.6*  HGB 11.5* 11.7*  HCT 36.9 38.2  MCV 90.9 91.2  PLT 276 275   Cardiac Enzymes: No results for input(s): CKTOTAL, CKMB, CKMBINDEX, TROPONINI in the last 168 hours. BNP: Invalid input(s): POCBNP CBG: No results for input(s): GLUCAP in the last 168 hours.    Disposition and Follow-up: Discharge Instructions    Diet - low sodium heart healthy   Complete by: As directed    Increase activity slowly   Complete by: As directed        DISPOSITION: Skilled nursing facility   DISCHARGE FOLLOW-UP Contact information for after-discharge care    Destination    [facility] Preferred SNF .   Service: Skilled Nursing Contact information: [address] [place]  [address] [phone]               Time coordinating discharge:  35-minute  Signed:   Thad Ranger M.D. [facility] 10/10/2019, 1:01 PM

## 2019-10-10 NOTE — TOC Progression Note (Signed)
Transition of Care [facility]) - Progression Note    Patient Details  Name: Alexis Cortez MRN: [id number] Date of Birth: 06-27-1931  Transition of Care [facility]) CM/SW [place], LCSW Phone Number: 10/10/2019, 9:18 AM  Clinical Narrative:   CSW spoke with patient to discuss discharge plans. Patient stated she wants to go home but understands that her son is unable to care for her as he is also covid positive. CSW gave patient bed offers and patient requested that CSW speak to her son.  CSW spoke with patients son via phone. Son stated that his to weak to care for patient at home and requested she goes home a facility for a couple of days. CSW went over bed offers and he stated he would prefer [facility].   CSW reached out to [facility] and they stated they will be able to take patient for rehab         Expected Discharge Plan and Services                                                 Social Determinants of Health (SDOH) Interventions    Readmission Risk Interventions No flowsheet data found.

## 2019-10-29 ENCOUNTER — Encounter (INDEPENDENT_AMBULATORY_CARE_PROVIDER_SITE_OTHER): Payer: Medicare Other | Admitting: Ophthalmology

## 2019-11-13 ENCOUNTER — Encounter (INDEPENDENT_AMBULATORY_CARE_PROVIDER_SITE_OTHER): Payer: Medicare Other | Admitting: Ophthalmology

## 2019-11-20 ENCOUNTER — Encounter (INDEPENDENT_AMBULATORY_CARE_PROVIDER_SITE_OTHER): Payer: Medicare PPO | Admitting: Ophthalmology

## 2019-11-27 ENCOUNTER — Encounter (INDEPENDENT_AMBULATORY_CARE_PROVIDER_SITE_OTHER): Payer: Medicare PPO | Admitting: Ophthalmology

## 2019-11-30 NOTE — Progress Notes (Signed)
[facility] - Clinic Note  12/03/2019     CHIEF COMPLAINT Patient presents for Retina Follow Up   HISTORY OF PRESENT ILLNESS: Alexis Cortez is a 86 y.o. female who presents to the clinic today for:   HPI    Retina Follow Up    Patient presents with  Wet AMD.  In right eye.  This started weeks ago.  Severity is moderate.  Duration of weeks.  Since onset it is stable.  I, the attending physician,  performed the HPI with the patient and updated documentation appropriately.          Comments    Pt states vision has been pretty good OU.  Pt denies eye pain or discomfort and denies any new or worsening floaters or fol OU.       Last edited by Rennis Chris, MD on 12/03/2019  2:07 PM. (History)    pt is delayed to follow up from 7 weeks to 4 months, pt states she has a hard time finding a ride here, she states no change in vision   Referring physician: April Manson, NP [phone]  [place] [address]  HISTORICAL INFORMATION:   Selected notes from the MEDICAL RECORD NUMBER Referred by [facility], [place], [place] for concern of exu ARMD LEE: 02.06.20 (Shil K. Patel) [BCVA: OD: 20/30+2 OS: 20/25 Ocular Hx-PCO OS, exu ARMD OD, non-exu ARMD OS, s/p YAG cap OD, s/p IVE OD 02.26.20  PMH-HTN, depression, anxiety   CURRENT MEDICATIONS: No current outpatient medications on file. (Ophthalmic Drugs)   No current facility-administered medications for this visit. (Ophthalmic Drugs)   Current Outpatient Medications (Other)  Medication Sig  . albuterol (VENTOLIN HFA) 108 (90 Base) MCG/ACT inhaler Inhale 2 puffs into the lungs every 6 (six) hours as needed for wheezing or shortness of breath.  Marland Kitchen amLODipine (NORVASC) 5 MG tablet Take 1 tablet (5 mg total) by mouth daily.  . benzonatate (TESSALON) 100 MG capsule Take 100 mg by mouth 3 (three) times daily.  . busPIRone (BUSPAR) 7.5 MG tablet Take 7.5 mg by mouth 2 (two) times daily.   . [employer]S PURELAX 17  GM/SCOOP powder Take 17 g by mouth daily as needed for moderate constipation.   . docusate sodium (COLACE) 100 MG capsule Take 100 mg by mouth daily as needed for mild constipation.   Marland Kitchen levothyroxine (SYNTHROID) 75 MCG tablet Take 75 mcg by mouth daily.  Marland Kitchen lisinopril (ZESTRIL) 10 MG tablet Take 10 mg by mouth daily.   . mirtazapine (REMERON) 15 MG tablet Take 15 mg by mouth at bedtime.   . Misc. Devices MISC Knee high compression stockings 18-30mmHg pressure to be worn during daytime hours and off at night Dx: peripheral edema  . Multiple Vitamin tablet Take 1 tablet by mouth daily.   . pregabalin (LYRICA) 50 MG capsule Take 50 mg by mouth daily.   . Skin Protectants, Misc. (EUCERIN) cream Apply 1 application topically as needed for dry skin.   Marland Kitchen traZODone (DESYREL) 100 MG tablet Take 100 mg by mouth at bedtime.    No current facility-administered medications for this visit. (Other)      REVIEW OF SYSTEMS: ROS    Positive for: Eyes   Negative for: Constitutional, Gastrointestinal, Neurological, Skin, Genitourinary, Musculoskeletal, HENT, Endocrine, Cardiovascular, Respiratory, Psychiatric, Allergic/Imm, Heme/Lymph   Last edited by Corrinne Eagle on 12/03/2019  1:47 PM. (History)       ALLERGIES No Known  Allergies  PAST MEDICAL HISTORY Past Medical History:  Diagnosis Date  . Hypertension   . Hypertensive retinopathy    OU  . Macular degeneration    Wet OD, Dry OS   Past Surgical History:  Procedure Laterality Date  . CATARACT EXTRACTION Bilateral   . EYE SURGERY    . YAG LASER APPLICATION Right     FAMILY HISTORY History reviewed. No pertinent family history.  SOCIAL HISTORY Social History   Tobacco Use  . Smoking status: Never Smoker  . Smokeless tobacco: Never Used  Substance Use Topics  . Alcohol use: Yes    Alcohol/week: 2.0 standard drinks    Types: 2 Standard drinks or equivalent per week  . Drug use: Never         OPHTHALMIC EXAM:  Base Eye  Exam    Visual Acuity (Snellen - Linear)      Right Left   Dist [place] 20/60 +2 20/60 +2   Dist ph [place] 20/40 -1 20/40 -1       Tonometry (Tonopen, 1:51 PM)      Right Left   Pressure 13 13       Pupils      Dark Light Shape React APD   Right 2 1 Round Minimal 0   Left 2 1 Round Minimal 0       Visual Fields      Left Right    Full Full       Extraocular Movement      Right Left    Full Full       Neuro/Psych    Oriented x3: Yes   Mood/Affect: Normal       Dilation    Both eyes: 1.0% Mydriacyl, 2.5% Phenylephrine @ 1:51 PM        Slit Lamp and Fundus Exam    Slit Lamp Exam      Right Left   Lids/Lashes Dermatochalasis - upper lid, mild Ptosis, mild Meibomian gland dysfunction Dermatochalasis - upper lid, mild Ptosis, mild Meibomian gland dysfunction   Conjunctiva/Sclera White and quiet mild temporal Pinguecula   Cornea Mild Arcus, trace Punctate epithelial erosions Mild Arcus, trace Punctate epithelial erosions   Anterior Chamber Deep and quiet Deep and quiet   Iris Round and dilated Round and dilated   Lens Posterior chamber intraocular lens Posterior chamber intraocular lens, 1+Posterior capsular opacification superiorly   Vitreous Vitreous syneresis, Posterior vitreous detachment Vitreous syneresis       Fundus Exam      Right Left   Disc Mild Pallor, Sharp rim, Peripapillary atrophy Mild Pallor, Sharp rim, Peripapillary atrophy   C/D Ratio 0.4 0.4   Macula Blunted foveal reflex, central PED/CNVM, Drusen, RPE mottling, clumping and atrophy, interval increase in cystic changes Flat, Blunted foveal reflex, Drusen, RPE mottling and clumping, No heme or edema   Vessels Vascular attenuation Vascular attenuation   Periphery Attached, No heme  Attached, No heme           IMAGING AND PROCEDURES  Imaging and Procedures for @TODAY @  OCT, Retina - OU - Both Eyes       Right Eye Quality was good. Central Foveal Thickness: 411. Progression has worsened. Findings  include abnormal foveal contour, outer retinal tubulation, intraretinal fluid, subretinal hyper-reflective material, pigment epithelial detachment, outer retinal atrophy, no SRF, retinal drusen  (Interval increase in IRF overlying PED).   Left Eye Quality was good. Central Foveal Thickness: 266. Progression has been stable. Findings include normal foveal  contour, no IRF, retinal drusen , no SRF, outer retinal atrophy (Trace ERM).   Notes *Images captured and stored on drive  Diagnosis / Impression:  OD: exu ARMD - Interval increase in IRF overlying PED OS: Non-exu ARMD  Clinical management:  See below  Abbreviations: NFP - Normal foveal profile. CME - cystoid macular edema. PED - pigment epithelial detachment. IRF - intraretinal fluid. SRF - subretinal fluid. EZ - ellipsoid zone. ERM - epiretinal membrane. ORA - outer retinal atrophy. ORT - outer retinal tubulation. SRHM - subretinal hyper-reflective material        Intravitreal Injection, Pharmacologic Agent - OD - Right Eye       Time Out 12/03/2019. 2:08 PM. Confirmed correct patient, procedure, site, and patient consented.   Anesthesia Topical anesthesia was used. Anesthetic medications included Lidocaine 2%, Proparacaine 0.5%.   Procedure Preparation included 5% betadine to ocular surface. A (32g) needle was used.   Injection:  2 mg aflibercept Gretta Cool) SOLN   NDC: L6038910, Lot: [id number], Expiration date: 04/17/2020   Route: Intravitreal, Site: Right Eye, Waste: 0.05 mg  Post-op Post injection exam found visual acuity of at least counting fingers. The patient tolerated the procedure well. There were no complications. The patient received written and verbal post procedure care education.                 ASSESSMENT/PLAN:    ICD-10-CM   1. Exudative age-related macular degeneration of right eye with active choroidal neovascularization (HCC)  H35.3211 Intravitreal Injection, Pharmacologic Agent - OD - Right Eye     aflibercept (EYLEA) SOLN 2 mg  2. Retinal edema  H35.81 OCT, Retina - OU - Both Eyes  3. Intermediate stage nonexudative age-related macular degeneration of left eye  H35.3122   4. Essential hypertension  I10   5. Hypertensive retinopathy of both eyes  H35.033   6. Pseudophakia of both eyes  Z96.1   7. Left posterior capsular opacification  H26.492     1,2. Exudative age related macular degeneration, right eye  - today, delayed to follow up from 7 weeks to 4 months due to transportation issues  - previous pt of Dr. Caroline More at [facility] in [place], [place] (last injection was IVE on 02.26.20, and was on a 7-8 injection interval) -- pt living in [place] temporarily with family  - S/P IVE #1 OD (07.08.20), #2 (08.19.20), # 3 (10.14.20)  - OCT shows interval increase in IRF overlying PED OD   - BCVA remains 20/40 OD  - recommend IVE OD #4 today (2.15.21)  - pt wishes to proceed  - RBA of procedure discussed, questions answered  - informed consent obtained  - Eylea informed consent form signed and scanned on 10.14.2020 (OD)  - Eylea4U benefits investigation started on April 25, 2019 -- approved for 2021  - see procedure note  - f/u in 7 wks -- DFE/OCT/possible injection  3. Age related macular degeneration, non-exudative, both eyes  - The incidence, anatomy, and pathology of dry AMD, risk of progression, and the AREDS and AREDS 2 study including smoking risks discussed with patient.  - Recommend amsler grid monitoring  - f/u 3 months  4,5. Hypertensive retinopathy OU  - discussed importance of tight BP control  - monitor  5,6. Pseudophakia OU  - s/p CE/IOL OU  - s/p YAG cap OD  - beautiful surgeries, doing well  - OS with mild noncentral PCO  - monitor   Ophthalmic Meds Ordered this visit:  Meds ordered this encounter  Medications  . aflibercept (EYLEA) SOLN 2 mg       Return for f/u 6-7 weeks exu ARMD OD, DFE, OCT.  There are no Patient Instructions on  file for this visit.   Explained the diagnoses, plan, and follow up with the patient and they expressed understanding.  Patient expressed understanding of the importance of proper follow up care.   This document serves as a record of services personally performed by Gardiner Sleeper, MD, PhD. It was created on their behalf by Leeann Must, [place], a certified ophthalmic assistant. The creation of this record is the provider's dictation and/or activities during the visit.    Electronically signed by: Leeann Must, COA @TODAY @ 11:24 PM   Gardiner Sleeper, M.D., Ph.D. Diseases & Surgery of the Retina and Vitreous [facility]  I have reviewed the above documentation for accuracy and completeness, and I agree with the above. Gardiner Sleeper, M.D., Ph.D. 12/03/19 11:24 PM   Abbreviations: M myopia (nearsighted); A astigmatism; H hyperopia (farsighted); P presbyopia; Mrx spectacle prescription;  CTL contact lenses; OD right eye; OS left eye; OU both eyes  XT exotropia; ET esotropia; PEK punctate epithelial keratitis; PEE punctate epithelial erosions; DES dry eye syndrome; MGD meibomian gland dysfunction; ATs artificial tears; PFAT's preservative free artificial tears; [place] nuclear sclerotic cataract; PSC posterior subcapsular cataract; ERM epi-retinal membrane; PVD posterior vitreous detachment; RD retinal detachment; DM diabetes mellitus; DR diabetic retinopathy; NPDR non-proliferative diabetic retinopathy; PDR proliferative diabetic retinopathy; CSME clinically significant macular edema; DME diabetic macular edema; dbh dot blot hemorrhages; CWS cotton wool spot; POAG primary open angle glaucoma; C/D cup-to-disc ratio; HVF humphrey visual field; GVF goldmann visual field; OCT optical coherence tomography; IOP intraocular pressure; BRVO Branch retinal vein occlusion; CRVO central retinal vein occlusion; CRAO central retinal artery occlusion; BRAO branch retinal artery occlusion; RT  retinal tear; SB scleral buckle; PPV pars plana vitrectomy; VH Vitreous hemorrhage; PRP panretinal laser photocoagulation; IVK intravitreal kenalog; VMT vitreomacular traction; MH Macular hole;  NVD neovascularization of the disc; NVE neovascularization elsewhere; AREDS age related eye disease study; ARMD age related macular degeneration; POAG primary open angle glaucoma; EBMD epithelial/anterior basement membrane dystrophy; ACIOL anterior chamber intraocular lens; IOL intraocular lens; PCIOL posterior chamber intraocular lens; Phaco/IOL phacoemulsification with intraocular lens placement; [place] photorefractive keratectomy; LASIK laser assisted in situ keratomileusis; HTN hypertension; DM diabetes mellitus; COPD chronic obstructive pulmonary disease

## 2019-12-03 ENCOUNTER — Encounter (INDEPENDENT_AMBULATORY_CARE_PROVIDER_SITE_OTHER): Payer: Self-pay | Admitting: Ophthalmology

## 2019-12-03 ENCOUNTER — Ambulatory Visit (INDEPENDENT_AMBULATORY_CARE_PROVIDER_SITE_OTHER): Payer: Medicare PPO | Admitting: Ophthalmology

## 2019-12-03 DIAGNOSIS — H353211 Exudative age-related macular degeneration, right eye, with active choroidal neovascularization: Secondary | ICD-10-CM

## 2019-12-03 DIAGNOSIS — H3581 Retinal edema: Secondary | ICD-10-CM | POA: Diagnosis not present

## 2019-12-03 DIAGNOSIS — I10 Essential (primary) hypertension: Secondary | ICD-10-CM

## 2019-12-03 DIAGNOSIS — H26492 Other secondary cataract, left eye: Secondary | ICD-10-CM

## 2019-12-03 DIAGNOSIS — Z961 Presence of intraocular lens: Secondary | ICD-10-CM

## 2019-12-03 DIAGNOSIS — H353122 Nonexudative age-related macular degeneration, left eye, intermediate dry stage: Secondary | ICD-10-CM

## 2019-12-03 DIAGNOSIS — H35033 Hypertensive retinopathy, bilateral: Secondary | ICD-10-CM

## 2019-12-03 MED ORDER — AFLIBERCEPT 2MG/0.05ML IZ SOLN FOR KALEIDOSCOPE
2.0000 mg | INTRAVITREAL | Status: AC | PRN
  Administered 2019-12-03: 15:00:00 2 mg via INTRAVITREAL

## 2020-01-22 ENCOUNTER — Encounter (INDEPENDENT_AMBULATORY_CARE_PROVIDER_SITE_OTHER): Payer: Medicare PPO | Admitting: Ophthalmology

## 2020-02-04 ENCOUNTER — Encounter (INDEPENDENT_AMBULATORY_CARE_PROVIDER_SITE_OTHER): Payer: Medicare PPO | Admitting: Ophthalmology

## 2020-02-07 NOTE — Progress Notes (Addendum)
Pt left before being seen by MD

## 2020-02-11 ENCOUNTER — Encounter (INDEPENDENT_AMBULATORY_CARE_PROVIDER_SITE_OTHER): Payer: Self-pay

## 2020-02-11 ENCOUNTER — Encounter (INDEPENDENT_AMBULATORY_CARE_PROVIDER_SITE_OTHER): Payer: Medicare PPO | Admitting: Ophthalmology

## 2020-02-11 ENCOUNTER — Encounter (INDEPENDENT_AMBULATORY_CARE_PROVIDER_SITE_OTHER): Payer: Self-pay | Admitting: Ophthalmology

## 2020-02-11 ENCOUNTER — Other Ambulatory Visit: Payer: Self-pay

## 2020-02-11 DIAGNOSIS — H3581 Retinal edema: Secondary | ICD-10-CM

## 2020-02-11 DIAGNOSIS — H35033 Hypertensive retinopathy, bilateral: Secondary | ICD-10-CM

## 2020-02-11 DIAGNOSIS — H353122 Nonexudative age-related macular degeneration, left eye, intermediate dry stage: Secondary | ICD-10-CM

## 2020-02-11 DIAGNOSIS — H26492 Other secondary cataract, left eye: Secondary | ICD-10-CM

## 2020-02-11 DIAGNOSIS — H353211 Exudative age-related macular degeneration, right eye, with active choroidal neovascularization: Secondary | ICD-10-CM

## 2020-02-11 DIAGNOSIS — Z961 Presence of intraocular lens: Secondary | ICD-10-CM

## 2020-02-11 DIAGNOSIS — I10 Essential (primary) hypertension: Secondary | ICD-10-CM

## 2020-02-11 NOTE — Progress Notes (Signed)
This encounter was created in error - please disregard.

## 2020-02-12 NOTE — Progress Notes (Addendum)
[facility] - Clinic Note  02/13/2020     CHIEF COMPLAINT Patient presents for Retina Follow Up   HISTORY OF PRESENT ILLNESS: Alexis Cortez is a 86 y.o. female who presents to the clinic today for:   HPI    Retina Follow Up    Patient presents with  Wet AMD.  In right eye.  This started weeks ago.  Severity is moderate.  Duration of weeks.  Since onset it is stable.  I, the attending physician,  performed the HPI with the patient and updated documentation appropriately.          Comments    Pt states vision is fine OU.  Patient denies eye pain or discomfort and denies any new or worsening floaters or fol OU.       Last edited by Rennis Chris, MD on 02/13/2020  3:29 PM. (History)    pt states vision is stable today  Referring physician: April Manson, NP [phone] [address] [place],  [place] [address]  HISTORICAL INFORMATION:   Selected notes from the MEDICAL RECORD NUMBER Referred by [facility], [place], [place] for concern of exu ARMD LEE: 02.06.20 (Shil K. Patel) [BCVA: OD: 20/30+2 OS: 20/25 Ocular Hx-PCO OS, exu ARMD OD, non-exu ARMD OS, s/p YAG cap OD, s/p IVE OD 02.26.20  PMH-HTN, depression, anxiety   CURRENT MEDICATIONS: No current outpatient medications on file. (Ophthalmic Drugs)   No current facility-administered medications for this visit. (Ophthalmic Drugs)   Current Outpatient Medications (Other)  Medication Sig  . albuterol (VENTOLIN HFA) 108 (90 Base) MCG/ACT inhaler Inhale 2 puffs into the lungs every 6 (six) hours as needed for wheezing or shortness of breath.  Marland Kitchen amLODipine (NORVASC) 5 MG tablet Take 1 tablet (5 mg total) by mouth daily.  . benzonatate (TESSALON) 100 MG capsule Take 100 mg by mouth 3 (three) times daily.  . busPIRone (BUSPAR) 7.5 MG tablet Take 7.5 mg by mouth 2 (two) times daily.   . [employer] PURELAX 17 GM/SCOOP powder Take 17 g by mouth daily as needed for moderate constipation.   . docusate sodium (COLACE) 100  MG capsule Take 100 mg by mouth daily as needed for mild constipation.   Marland Kitchen esomeprazole (NEXIUM) 40 MG capsule Take by mouth.  . levothyroxine (SYNTHROID) 75 MCG tablet Take 75 mcg by mouth daily.  Marland Kitchen lisinopril (ZESTRIL) 10 MG tablet Take 10 mg by mouth daily.   . mirtazapine (REMERON) 15 MG tablet Take 15 mg by mouth at bedtime.   . Misc. Devices MISC Knee high compression stockings 18-47mmHg pressure to be worn during daytime hours and off at night Dx: peripheral edema  . Multiple Vitamin tablet Take 1 tablet by mouth daily.   . pregabalin (LYRICA) 50 MG capsule Take 50 mg by mouth daily.   . Skin Protectants, Misc. (EUCERIN) cream Apply 1 application topically as needed for dry skin.   Marland Kitchen traZODone (DESYREL) 100 MG tablet Take 100 mg by mouth at bedtime.    No current facility-administered medications for this visit. (Other)      REVIEW OF SYSTEMS: ROS    Positive for: Eyes   Negative for: Constitutional, Gastrointestinal, Neurological, Skin, Genitourinary, Musculoskeletal, HENT, Endocrine, Cardiovascular, Respiratory, Psychiatric, Allergic/Imm, Heme/Lymph   Last edited by Corrinne Eagle on 02/13/2020  2:15 PM. (History)       ALLERGIES No Known Allergies  PAST MEDICAL HISTORY Past Medical History:  Diagnosis Date  . Hypertension   .  Hypertensive retinopathy    OU  . Macular degeneration    Wet OD, Dry OS   Past Surgical History:  Procedure Laterality Date  . CATARACT EXTRACTION Bilateral   . EYE SURGERY    . YAG LASER APPLICATION Right     FAMILY HISTORY History reviewed. No pertinent family history.  SOCIAL HISTORY Social History   Tobacco Use  . Smoking status: Never Smoker  . Smokeless tobacco: Never Used  Substance Use Topics  . Alcohol use: Yes    Alcohol/week: 2.0 standard drinks    Types: 2 Standard drinks or equivalent per week  . Drug use: Never         OPHTHALMIC EXAM:  Base Eye Exam    Visual Acuity (Snellen - Linear)      Right Left    Dist [place] 20/40 -2 20/70 -2   Dist ph  20/30 -1 20/30 -1       Tonometry (Tonopen, 2:11 PM)      Right Left   Pressure 13 12       Pupils      Dark Light Shape React APD   Right 2 1 Round Minimal 0   Left 2 1 Round Minimal 0       Visual Fields      Left Right    Full Full       Extraocular Movement      Right Left    Full Full       Neuro/Psych    Oriented x3: Yes   Mood/Affect: Normal       Dilation    Both eyes: 2.5% Phenylephrine, 1.0% Mydriacyl @ 2:11 PM        Slit Lamp and Fundus Exam    Slit Lamp Exam      Right Left   Lids/Lashes Dermatochalasis - upper lid, mild Ptosis, mild Meibomian gland dysfunction Dermatochalasis - upper lid, mild Ptosis, mild Meibomian gland dysfunction   Conjunctiva/Sclera White and quiet mild temporal Pinguecula   Cornea Mild Arcus, trace Punctate epithelial erosions Mild Arcus, trace Punctate epithelial erosions   Anterior Chamber Deep and quiet Deep and quiet   Iris Round and dilated Round and dilated   Lens Posterior chamber intraocular lens Posterior chamber intraocular lens, 1+Posterior capsular opacification superiorly   Vitreous Vitreous syneresis, Posterior vitreous detachment Vitreous syneresis       Fundus Exam      Right Left   Disc Mild Pallor, Sharp rim, Peripapillary atrophy Mild Pallor, Sharp rim, Peripapillary atrophy   C/D Ratio 0.5 0.4   Macula Blunted foveal reflex, central PED/CNVM, Drusen, RPE mottling, clumping and atrophy, persistent, mild cystic changes Flat, Blunted foveal reflex, Drusen, RPE mottling and clumping, No heme or edema   Vessels Vascular attenuation Vascular attenuation   Periphery Attached, No heme  Attached, No heme           IMAGING AND PROCEDURES  Imaging and Procedures for @TODAY @  Intravitreal Injection, Pharmacologic Agent - OD - Right Eye       Time Out 02/13/2020. 1:52 PM. Confirmed correct patient, procedure, site, and patient consented.   Anesthesia Topical  anesthesia was used. Anesthetic medications included Lidocaine 2%, Proparacaine 0.5%.   Procedure Preparation included 5% betadine to ocular surface, eyelid speculum. A (33g) needle was used.   Injection:  2 mg aflibercept 02/15/2020) SOLN   NDC: Gretta Cool, Lot: [id number], Expiration date: 05/13/2020   Route: Intravitreal, Site: Right Eye, Waste: 0.05 mL  Post-op Post injection exam  found visual acuity of at least counting fingers. The patient tolerated the procedure well. There were no complications. The patient received written and verbal post procedure care education.        OCT, Retina - OU - Both Eyes       Right Eye Quality was good. Central Foveal Thickness: 408. Progression has been stable. Findings include abnormal foveal contour, outer retinal tubulation, intraretinal fluid, subretinal hyper-reflective material, pigment epithelial detachment, outer retinal atrophy, no SRF, retinal drusen  (persistent IRF overlying PED).   Left Eye Quality was good. Central Foveal Thickness: 263. Progression has been stable. Findings include normal foveal contour, no IRF, retinal drusen , no SRF, outer retinal atrophy (Trace ERM).   Notes *Images captured and stored on drive  Diagnosis / Impression:  OD: exu ARMD - persistent IRF overlying PED OS: Non-exu ARMD  Clinical management:  See below  Abbreviations: NFP - Normal foveal profile. CME - cystoid macular edema. PED - pigment epithelial detachment. IRF - intraretinal fluid. SRF - subretinal fluid. EZ - ellipsoid zone. ERM - epiretinal membrane. ORA - outer retinal atrophy. ORT - outer retinal tubulation. SRHM - subretinal hyper-reflective material                 ASSESSMENT/PLAN:    ICD-10-CM   1. Exudative age-related macular degeneration of right eye with active choroidal neovascularization (HCC)  H35.3211 Intravitreal Injection, Pharmacologic Agent - OD - Right Eye    aflibercept (EYLEA) SOLN 2 mg  2. Retinal edema   H35.81 OCT, Retina - OU - Both Eyes  3. Intermediate stage nonexudative age-related macular degeneration of left eye  H35.3122   4. Essential hypertension  I10   5. Hypertensive retinopathy of both eyes  H35.033   6. Pseudophakia of both eyes  Z96.1   7. Left posterior capsular opacification  H26.492     1,2. Exudative age related macular degeneration, right eye  - today, delayed to follow up from 7 weeks to 10 wks  - previous pt of Dr. Georges Lynch at [facility] in [place], [place] (last injection was IVE on 02.26.20, and was on a 7-8 injection interval) -- pt living in [place] temporarily with family  - S/P IVE #1 OD (07.08.20), #2 (08.19.20), # 3 (10.14.20), #4 (2.15.21)  - OCT shows persistent IRF overlying PED OD   - BCVA 20/30 OD (improved from 20/40)  - recommend IVE OD #5 today (04.28.21)  - pt wishes to proceed  - RBA of procedure discussed, questions answered  - informed consent obtained  - Eylea informed consent form signed and scanned on 10.14.2020 (OD)  - Eylea4U benefits investigation started on April 25, 2019 -- approved for 2021  - see procedure note  - f/u in 10 wks -- DFE/OCT/possible injection  3. Age related macular degeneration, non-exudative, both eyes  - The incidence, anatomy, and pathology of dry AMD, risk of progression, and the AREDS and AREDS 2 study including smoking risks discussed with patient.  - Recommend amsler grid monitoring  - f/u in 3 months  4,5. Hypertensive retinopathy OU  - discussed importance of tight BP control  - monitor  6,7. Pseudophakia OU  - s/p CE/IOL OU  - s/p YAG cap OD  - beautiful surgeries, doing well  - OS with mild noncentral PCO  - monitor   Ophthalmic Meds Ordered this visit:  Meds ordered this encounter  Medications  . aflibercept (EYLEA) SOLN 2 mg       Return  in about 10 weeks (around 04/23/2020) for f/u exu ARMD OD, DFE, OCT.  There are no Patient Instructions on file for this visit.   Explained  the diagnoses, plan, and follow up with the patient and they expressed understanding.  Patient expressed understanding of the importance of proper follow up care.   Karie Chimera, M.D., Ph.D. Diseases & Surgery of the Retina and Vitreous [facility] 02/13/2020   I have reviewed the above documentation for accuracy and completeness, and I agree with the above. Karie Chimera, M.D., Ph.D. 02/14/20 11:30 PM    Abbreviations: M myopia (nearsighted); A astigmatism; H hyperopia (farsighted); P presbyopia; Mrx spectacle prescription;  CTL contact lenses; OD right eye; OS left eye; OU both eyes  XT exotropia; ET esotropia; PEK punctate epithelial keratitis; PEE punctate epithelial erosions; DES dry eye syndrome; MGD meibomian gland dysfunction; ATs artificial tears; PFAT's preservative free artificial tears; NSC nuclear sclerotic cataract; PSC posterior subcapsular cataract; ERM epi-retinal membrane; PVD posterior vitreous detachment; RD retinal detachment; DM diabetes mellitus; DR diabetic retinopathy; NPDR non-proliferative diabetic retinopathy; PDR proliferative diabetic retinopathy; CSME clinically significant macular edema; DME diabetic macular edema; dbh dot blot hemorrhages; CWS cotton wool spot; POAG primary open angle glaucoma; C/D cup-to-disc ratio; HVF humphrey visual field; GVF goldmann visual field; OCT optical coherence tomography; IOP intraocular pressure; BRVO Branch retinal vein occlusion; CRVO central retinal vein occlusion; CRAO central retinal artery occlusion; BRAO branch retinal artery occlusion; RT retinal tear; SB scleral buckle; PPV pars plana vitrectomy; VH Vitreous hemorrhage; PRP panretinal laser photocoagulation; IVK intravitreal kenalog; VMT vitreomacular traction; MH Macular hole;  NVD neovascularization of the disc; NVE neovascularization elsewhere; AREDS age related eye disease study; ARMD age related macular degeneration; POAG primary open angle  glaucoma; EBMD epithelial/anterior basement membrane dystrophy; ACIOL anterior chamber intraocular lens; IOL intraocular lens; PCIOL posterior chamber intraocular lens; Phaco/IOL phacoemulsification with intraocular lens placement; PRK photorefractive keratectomy; LASIK laser assisted in situ keratomileusis; HTN hypertension; DM diabetes mellitus; COPD chronic obstructive pulmonary disease

## 2020-02-13 ENCOUNTER — Ambulatory Visit (INDEPENDENT_AMBULATORY_CARE_PROVIDER_SITE_OTHER): Payer: Medicare PPO | Admitting: Ophthalmology

## 2020-02-13 ENCOUNTER — Other Ambulatory Visit: Payer: Self-pay

## 2020-02-13 DIAGNOSIS — H353211 Exudative age-related macular degeneration, right eye, with active choroidal neovascularization: Secondary | ICD-10-CM

## 2020-02-13 DIAGNOSIS — Z961 Presence of intraocular lens: Secondary | ICD-10-CM

## 2020-02-13 DIAGNOSIS — H26492 Other secondary cataract, left eye: Secondary | ICD-10-CM

## 2020-02-13 DIAGNOSIS — I10 Essential (primary) hypertension: Secondary | ICD-10-CM | POA: Diagnosis not present

## 2020-02-13 DIAGNOSIS — H3581 Retinal edema: Secondary | ICD-10-CM

## 2020-02-13 DIAGNOSIS — H353122 Nonexudative age-related macular degeneration, left eye, intermediate dry stage: Secondary | ICD-10-CM

## 2020-02-13 DIAGNOSIS — H35033 Hypertensive retinopathy, bilateral: Secondary | ICD-10-CM

## 2020-02-14 ENCOUNTER — Encounter (INDEPENDENT_AMBULATORY_CARE_PROVIDER_SITE_OTHER): Payer: Self-pay | Admitting: Ophthalmology

## 2020-02-14 MED ORDER — AFLIBERCEPT 2MG/0.05ML IZ SOLN FOR KALEIDOSCOPE
2.0000 mg | INTRAVITREAL | Status: AC | PRN
  Administered 2020-02-14: 2 mg via INTRAVITREAL

## 2020-04-23 ENCOUNTER — Encounter (INDEPENDENT_AMBULATORY_CARE_PROVIDER_SITE_OTHER): Payer: Medicare PPO | Admitting: Ophthalmology

## 2020-05-09 ENCOUNTER — Encounter (INDEPENDENT_AMBULATORY_CARE_PROVIDER_SITE_OTHER): Payer: Medicare PPO | Admitting: Ophthalmology

## 2020-05-12 ENCOUNTER — Encounter (INDEPENDENT_AMBULATORY_CARE_PROVIDER_SITE_OTHER): Payer: Medicare PPO | Admitting: Ophthalmology

## 2020-05-12 DIAGNOSIS — H353122 Nonexudative age-related macular degeneration, left eye, intermediate dry stage: Secondary | ICD-10-CM

## 2020-05-12 DIAGNOSIS — H353211 Exudative age-related macular degeneration, right eye, with active choroidal neovascularization: Secondary | ICD-10-CM

## 2020-05-12 DIAGNOSIS — H26492 Other secondary cataract, left eye: Secondary | ICD-10-CM

## 2020-05-12 DIAGNOSIS — H35033 Hypertensive retinopathy, bilateral: Secondary | ICD-10-CM

## 2020-05-12 DIAGNOSIS — H3581 Retinal edema: Secondary | ICD-10-CM

## 2020-05-12 DIAGNOSIS — Z961 Presence of intraocular lens: Secondary | ICD-10-CM

## 2020-05-12 DIAGNOSIS — I10 Essential (primary) hypertension: Secondary | ICD-10-CM

## 2020-05-27 ENCOUNTER — Encounter (INDEPENDENT_AMBULATORY_CARE_PROVIDER_SITE_OTHER): Payer: Medicare PPO | Admitting: Ophthalmology

## 2020-07-03 NOTE — Progress Notes (Signed)
[facility] - Clinic Note  07/04/2020     CHIEF COMPLAINT Patient presents for Retina Follow Up   HISTORY OF PRESENT ILLNESS: Alexis Cortez is a 86 y.o. female who presents to the clinic today for:   HPI    Retina Follow Up    Patient presents with  Wet AMD.  In right eye.  This started weeks ago.  Severity is moderate.  Duration of weeks.  Since onset it is stable.  I, the attending physician,  performed the HPI with the patient and updated documentation appropriately.          Comments    Pt states vision is the same OU.  Pt denies eye pain or discomfort.  Patient denies any new or worsening floaters or fol OU.       Last edited by Rennis ChrisZamora, Allora Bains, MD on 07/05/2020  1:48 AM. (History)    delayed f/u from 10 wks to 5 mos. pt states vision is stable today  Referring physician: April MansonWhite, Marsha L, NP [phone] [place],  [place] [id number]  HISTORICAL INFORMATION:   Selected notes from the MEDICAL RECORD NUMBER Referred by [facility], [place], [place] for concern of exu ARMD LEE: 02.06.20 (Shil K. Patel) [BCVA: OD: 20/30+2 OS: 20/25 Ocular Hx-PCO OS, exu ARMD OD, non-exu ARMD OS, s/p YAG cap OD, s/p IVE OD 02.26.20  PMH-HTN, depression, anxiety   CURRENT MEDICATIONS: No current outpatient medications on file. (Ophthalmic Drugs)   No current facility-administered medications for this visit. (Ophthalmic Drugs)   Current Outpatient Medications (Other)  Medication Sig  . albuterol (VENTOLIN HFA) 108 (90 Base) MCG/ACT inhaler Inhale 2 puffs into the lungs every 6 (six) hours as needed for wheezing or shortness of breath.  Marland Kitchen. amLODipine (NORVASC) 5 MG tablet Take 1 tablet (5 mg total) by mouth daily.  . benzonatate (TESSALON) 100 MG capsule Take 100 mg by mouth 3 (three) times daily.  . busPIRone (BUSPAR) 7.5 MG tablet Take 7.5 mg by mouth 2 (two) times daily.   . [employer] PURELAX 17 GM/SCOOP powder Take 17 g by mouth daily as needed for moderate  constipation.   . docusate sodium (COLACE) 100 MG capsule Take 100 mg by mouth daily as needed for mild constipation.   Marland Kitchen. esomeprazole (NEXIUM) 40 MG capsule Take by mouth.  . levothyroxine (SYNTHROID) 75 MCG tablet Take 75 mcg by mouth daily.  Marland Kitchen. lisinopril (ZESTRIL) 10 MG tablet Take 10 mg by mouth daily.   . mirtazapine (REMERON) 15 MG tablet Take 15 mg by mouth at bedtime.   . Misc. Devices MISC Knee high compression stockings 18-6031mmHg pressure to be worn during daytime hours and off at night Dx: peripheral edema  . Multiple Vitamin tablet Take 1 tablet by mouth daily.   . pregabalin (LYRICA) 50 MG capsule Take 50 mg by mouth daily.   . traZODone (DESYREL) 100 MG tablet Take 100 mg by mouth at bedtime.    No current facility-administered medications for this visit. (Other)      REVIEW OF SYSTEMS: ROS    Positive for: Eyes   Negative for: Constitutional, Gastrointestinal, Neurological, Skin, Genitourinary, Musculoskeletal, HENT, Endocrine, Cardiovascular, Respiratory, Psychiatric, Allergic/Imm, Heme/Lymph   Last edited by Corrinne EagleEnglish, Ashley L on 07/04/2020  1:55 PM. (History)       ALLERGIES No Known Allergies  PAST MEDICAL HISTORY Past Medical History:  Diagnosis Date  . Hypertension   . Hypertensive retinopathy    OU  .  Macular degeneration    Wet OD, Dry OS   Past Surgical History:  Procedure Laterality Date  . CATARACT EXTRACTION Bilateral   . EYE SURGERY    . YAG LASER APPLICATION Right     FAMILY HISTORY History reviewed. No pertinent family history.  SOCIAL HISTORY Social History   Tobacco Use  . Smoking status: Never Smoker  . Smokeless tobacco: Never Used  Vaping Use  . Vaping Use: Never used  Substance Use Topics  . Alcohol use: Yes    Alcohol/week: 2.0 standard drinks    Types: 2 Standard drinks or equivalent per week  . Drug use: Never         OPHTHALMIC EXAM:  Base Eye Exam    Visual Acuity (Snellen - Linear)      Right Left   Dist [place]  20/25 -1 20/70 -2   Dist ph [place] NI 20/50 +2       Tonometry (Tonopen, 2:11 PM)      Right Left   Pressure 14 12       Pupils      Dark Light Shape React APD   Right 3 2 Round Brisk 0   Left 3 2 Round Brisk 0       Visual Fields      Left Right    Full Full       Extraocular Movement      Right Left    Full Full       Neuro/Psych    Oriented x3: Yes   Mood/Affect: Normal       Dilation    Both eyes: 1.0% Mydriacyl, 2.5% Phenylephrine @ 2:11 PM        Slit Lamp and Fundus Exam    Slit Lamp Exam      Right Left   Lids/Lashes Dermatochalasis - upper lid, mild Ptosis, mild Meibomian gland dysfunction Dermatochalasis - upper lid, mild Ptosis, mild Meibomian gland dysfunction   Conjunctiva/Sclera White and quiet mild temporal Pinguecula   Cornea Mild Arcus, trace Punctate epithelial erosions Mild Arcus, trace Punctate epithelial erosions   Anterior Chamber Deep and quiet Deep and quiet   Iris Round and dilated Round and dilated   Lens Posterior chamber intraocular lens Posterior chamber intraocular lens, 1+Posterior capsular opacification superiorly   Vitreous Vitreous syneresis, Posterior vitreous detachment Vitreous syneresis       Fundus Exam      Right Left   Disc Mild Pallor, Sharp rim, Peripapillary atrophy Mild Pallor, Sharp rim, Peripapillary atrophy   C/D Ratio 0.5 0.4   Macula Blunted foveal reflex, central PED/CNVM, Drusen, RPE mottling, clumping and atrophy, persistent, mild cystic changes--slightly increased Flat, Blunted foveal reflex, Drusen, RPE mottling and clumping, No heme or edema   Vessels Vascular attenuation Vascular attenuation   Periphery Attached, No heme  Attached, No heme           IMAGING AND PROCEDURES  Imaging and Procedures for @TODAY @  OCT, Retina - OU - Both Eyes       Right Eye Quality was good. Central Foveal Thickness: 420. Progression has worsened. Findings include abnormal foveal contour, outer retinal tubulation,  intraretinal fluid, subretinal hyper-reflective material, pigment epithelial detachment, outer retinal atrophy, no SRF, retinal drusen  (Interval increase in IRF overlying PED,  mild interval progression of central ORA).   Left Eye Quality was good. Central Foveal Thickness: 267. Progression has been stable. Findings include normal foveal contour, no IRF, retinal drusen , no SRF, outer retinal  atrophy (Trace ERM, mild interval progression of central ORA).   Notes *Images captured and stored on drive  Diagnosis / Impression:  OD: exu ARMD - interval increase in IRF overlying PED OS: Non-exu ARMD -- mild interval progression of central ORA  Clinical management:  See below  Abbreviations: NFP - Normal foveal profile. CME - cystoid macular edema. PED - pigment epithelial detachment. IRF - intraretinal fluid. SRF - subretinal fluid. EZ - ellipsoid zone. ERM - epiretinal membrane. ORA - outer retinal atrophy. ORT - outer retinal tubulation. SRHM - subretinal hyper-reflective material        Intravitreal Injection, Pharmacologic Agent - OD - Right Eye       Time Out 07/04/2020. 12:00 AM. Confirmed correct patient, procedure, site, and patient consented.   Anesthesia Topical anesthesia was used. Anesthetic medications included Lidocaine 2%, Proparacaine 0.5%.   Procedure Preparation included 5% betadine to ocular surface, eyelid speculum. A (32g) needle was used.   Injection:  2 mg aflibercept Gretta Cool) SOLN   NDC: L6038910, Lot: [id number], Expiration date: 09/17/2020   Route: Intravitreal, Site: Right Eye, Waste: 0.05 mL  Post-op Post injection exam found visual acuity of at least counting fingers. The patient tolerated the procedure well. There were no complications. The patient received written and verbal post procedure care education.                 ASSESSMENT/PLAN:    ICD-10-CM   1. Exudative age-related macular degeneration of right eye with active choroidal  neovascularization (HCC)  H35.3211 Intravitreal Injection, Pharmacologic Agent - OD - Right Eye    aflibercept (EYLEA) SOLN 2 mg  2. Retinal edema  H35.81 OCT, Retina - OU - Both Eyes  3. Intermediate stage nonexudative age-related macular degeneration of left eye  H35.3122   4. Essential hypertension  I10   5. Hypertensive retinopathy of both eyes  H35.033   6. Pseudophakia of both eyes  Z96.1   7. Left posterior capsular opacification  H26.492     1,2. Exudative age related macular degeneration, right eye  - today, delayed to follow up from 10 wks to 5 mos  - previous pt of Dr. Caroline More at [facility] in [place], [place] (last injection was IVE on 02.26.20, and was on a 7-8 injection interval) -- pt living in [place] temporarily with family  - S/P IVE #1 OD (07.08.20), #2 (08.19.20), # 3 (10.14.20), #4 (02.15.21), #4 (04.28.21)  - OCT shows increased IRF overlying PED OD   - BCVA 20/30 OD (improved from 20/40)  - recommend IVE OD #5 today (09.17.21)  - pt wishes to proceed  - RBA of procedure discussed, questions answered  - informed consent obtained  - Eylea informed consent form signed and scanned on 10.14.2020 (OD)  - Eylea4U benefits investigation started on April 25, 2019 -- approved for 2021  - see procedure note  - f/u in 6-8 wks -- DFE/OCT/possible injection  3. Age related macular degeneration, non-exudative, left eye  - The incidence, anatomy, and pathology of dry AMD, risk of progression, and the AREDS and AREDS 2 study including smoking risks discussed with patient.  - Recommend amsler grid monitoring  - f/u in 3 months  4,5. Hypertensive retinopathy OU  - discussed importance of tight BP control  - monitor  6,7. Pseudophakia OU  - s/p CE/IOL OU  - s/p YAG cap OD  - beautiful surgeries, doing well  - OS with mild noncentral PCO  - monitor  Ophthalmic Meds Ordered this visit:  Meds ordered this encounter  Medications  . aflibercept (EYLEA) SOLN 2 mg        Return for 6-8 wks - ex ARMD OD, Dilated Exam, OCT, Possible Injxn.  There are no Patient Instructions on file for this visit.   Explained the diagnoses, plan, and follow up with the patient and they expressed understanding.  Patient expressed understanding of the importance of proper follow up care.   This document serves as a record of services personally performed by Karie Chimera, MD, PhD. It was created on their behalf by Glee Arvin. Manson Passey, OA an ophthalmic technician. The creation of this record is the provider's dictation and/or activities during the visit.    Electronically signed by: Glee Arvin. Manson Passey, [place] 09.16.2021 1:53 AM   Karie Chimera, M.D., Ph.D. Diseases & Surgery of the Retina and Vitreous [facility]  I have reviewed the above documentation for accuracy and completeness, and I agree with the above. Karie Chimera, M.D., Ph.D. 07/05/20 1:53 AM   Abbreviations: M myopia (nearsighted); A astigmatism; H hyperopia (farsighted); P presbyopia; Mrx spectacle prescription;  CTL contact lenses; OD right eye; OS left eye; OU both eyes  XT exotropia; ET esotropia; PEK punctate epithelial keratitis; PEE punctate epithelial erosions; DES dry eye syndrome; MGD meibomian gland dysfunction; ATs artificial tears; PFAT's preservative free artificial tears; NSC nuclear sclerotic cataract; PSC posterior subcapsular cataract; ERM epi-retinal membrane; PVD posterior vitreous detachment; RD retinal detachment; DM diabetes mellitus; DR diabetic retinopathy; NPDR non-proliferative diabetic retinopathy; PDR proliferative diabetic retinopathy; CSME clinically significant macular edema; DME diabetic macular edema; dbh dot blot hemorrhages; CWS cotton wool spot; POAG primary open angle glaucoma; C/D cup-to-disc ratio; HVF humphrey visual field; GVF goldmann visual field; OCT optical coherence tomography; IOP intraocular pressure; BRVO Branch retinal vein occlusion; CRVO  central retinal vein occlusion; CRAO central retinal artery occlusion; BRAO branch retinal artery occlusion; RT retinal tear; SB scleral buckle; PPV pars plana vitrectomy; VH Vitreous hemorrhage; PRP panretinal laser photocoagulation; IVK intravitreal kenalog; VMT vitreomacular traction; MH Macular hole;  NVD neovascularization of the disc; NVE neovascularization elsewhere; AREDS age related eye disease study; ARMD age related macular degeneration; POAG primary open angle glaucoma; EBMD epithelial/anterior basement membrane dystrophy; ACIOL anterior chamber intraocular lens; IOL intraocular lens; PCIOL posterior chamber intraocular lens; Phaco/IOL phacoemulsification with intraocular lens placement; PRK photorefractive keratectomy; LASIK laser assisted in situ keratomileusis; HTN hypertension; DM diabetes mellitus; COPD chronic obstructive pulmonary disease

## 2020-07-04 ENCOUNTER — Other Ambulatory Visit: Payer: Self-pay

## 2020-07-04 ENCOUNTER — Ambulatory Visit (INDEPENDENT_AMBULATORY_CARE_PROVIDER_SITE_OTHER): Payer: Medicare PPO | Admitting: Ophthalmology

## 2020-07-04 DIAGNOSIS — H353211 Exudative age-related macular degeneration, right eye, with active choroidal neovascularization: Secondary | ICD-10-CM

## 2020-07-04 DIAGNOSIS — H3581 Retinal edema: Secondary | ICD-10-CM | POA: Diagnosis not present

## 2020-07-04 DIAGNOSIS — H26492 Other secondary cataract, left eye: Secondary | ICD-10-CM

## 2020-07-04 DIAGNOSIS — H35033 Hypertensive retinopathy, bilateral: Secondary | ICD-10-CM

## 2020-07-04 DIAGNOSIS — H353122 Nonexudative age-related macular degeneration, left eye, intermediate dry stage: Secondary | ICD-10-CM | POA: Diagnosis not present

## 2020-07-04 DIAGNOSIS — I10 Essential (primary) hypertension: Secondary | ICD-10-CM

## 2020-07-04 DIAGNOSIS — Z961 Presence of intraocular lens: Secondary | ICD-10-CM

## 2020-07-05 ENCOUNTER — Encounter (INDEPENDENT_AMBULATORY_CARE_PROVIDER_SITE_OTHER): Payer: Self-pay | Admitting: Ophthalmology

## 2020-07-05 DIAGNOSIS — H353211 Exudative age-related macular degeneration, right eye, with active choroidal neovascularization: Secondary | ICD-10-CM | POA: Diagnosis not present

## 2020-07-05 MED ORDER — AFLIBERCEPT 2MG/0.05ML IZ SOLN FOR KALEIDOSCOPE
2.0000 mg | INTRAVITREAL | Status: AC | PRN
  Administered 2020-07-05: 2 mg via INTRAVITREAL

## 2020-08-22 ENCOUNTER — Encounter (INDEPENDENT_AMBULATORY_CARE_PROVIDER_SITE_OTHER): Payer: Medicare PPO | Admitting: Ophthalmology

## 2021-06-25 NOTE — Progress Notes (Signed)
[facility] - Clinic Note  06/29/2021     CHIEF COMPLAINT Patient presents for Retina Follow Up   HISTORY OF PRESENT ILLNESS: Alexis Cortez is a 87 y.o. female who presents to the clinic today for:   HPI     Retina Follow Up   Patient presents with  Wet AMD.  In right eye.  This started 1 year ago.  I, the attending physician,  performed the HPI with the patient and updated documentation appropriately.        Comments   Patient here for 1 year Retina follow up for exu ARMD OD. Patient states vision seems to be doing fine. No eye pain.      Last edited by Rennis ChrisZamora, Shery [place]un[place], MD on 06/29/2021  2:49 PM.     Referring physician: April MansonWhite, Marsha L, NP [phone] [address] [place],  [place] [id number]  HISTORICAL INFORMATION:   Selected notes from the MEDICAL RECORD NUMBER Referred by [facility], [place], [place] for concern of exu ARMD LEE: 02.06.20 (Shil K. Patel) [BCVA: OD: 20/30+2 OS: 20/25 Ocular Hx-PCO OS, exu ARMD OD, non-exu ARMD OS, s/p YAG cap OD, s/p IVE OD 02.26.20  PMH-HTN, depression, anxiety   CURRENT MEDICATIONS: No current outpatient medications on file. (Ophthalmic Drugs)   No current facility-administered medications for this visit. (Ophthalmic Drugs)   Current Outpatient Medications (Other)  Medication Sig   albuterol (VENTOLIN HFA) 108 (90 Base) MCG/ACT inhaler Inhale 2 puffs into the lungs every 6 (six) hours as needed for wheezing or shortness of breath.   amLODipine (NORVASC) 5 MG tablet Take 1 tablet (5 mg total) by mouth daily.   benzonatate (TESSALON) 100 MG capsule Take 100 mg by mouth 3 (three) times daily.   busPIRone (BUSPAR) 7.5 MG tablet Take 7.5 mg by mouth 2 (two) times daily.    [employer] PURELAX 17 GM/SCOOP powder Take 17 g by mouth daily as needed for moderate constipation.    docusate sodium (COLACE) 100 MG capsule Take 100 mg by mouth daily as needed for mild constipation.    esomeprazole (NEXIUM) 40 MG capsule Take by  mouth.   levothyroxine (SYNTHROID) 75 MCG tablet Take 75 mcg by mouth daily.   lisinopril (ZESTRIL) 10 MG tablet Take 10 mg by mouth daily.    mirtazapine (REMERON) 15 MG tablet Take 15 mg by mouth at bedtime.    Misc. Devices MISC Knee high compression stockings 18-8831mmHg pressure to be worn during daytime hours and off at night Dx: peripheral edema   Multiple Vitamin tablet Take 1 tablet by mouth daily.    pregabalin (LYRICA) 50 MG capsule Take 50 mg by mouth daily.    traZODone (DESYREL) 100 MG tablet Take 100 mg by mouth at bedtime.    No current facility-administered medications for this visit. (Other)      REVIEW OF SYSTEMS: ROS   Positive for: Eyes Negative for: Constitutional, Gastrointestinal, Neurological, Skin, Genitourinary, Musculoskeletal, HENT, Endocrine, Cardiovascular, Respiratory, Psychiatric, Allergic/Imm, Heme/Lymph Last edited by Laddie Aquaslarke, Rebecca S, COA on 06/29/2021  1:21 PM.        ALLERGIES No Known Allergies  PAST MEDICAL HISTORY Past Medical History:  Diagnosis Date   Hypertension    Hypertensive retinopathy    OU   Macular degeneration    Wet OD, Dry OS   Past Surgical History:  Procedure Laterality Date   CATARACT EXTRACTION Bilateral    EYE SURGERY     YAG LASER APPLICATION Right  FAMILY HISTORY History reviewed. No pertinent family history.  SOCIAL HISTORY Social History   Tobacco Use   Smoking status: Never   Smokeless tobacco: Never  Vaping Use   Vaping Use: Never used  Substance Use Topics   Alcohol use: Yes    Alcohol/week: 2.0 standard drinks    Types: 2 Standard drinks or equivalent per week   Drug use: Never         OPHTHALMIC EXAM:  Base Eye Exam     Visual Acuity (Snellen - Linear)       Right Left   Dist [place] 20/30 -2 20/70 -1   Dist ph [place] NI 20/40 -1         Tonometry (Tonopen, 1:18 PM)       Right Left   Pressure 14 12         Pupils       Dark Light Shape React APD   Right 3 2 Round Brisk  None   Left 3 2 Round Brisk None         Visual Fields (Counting fingers)       Left Right    Full Full         Extraocular Movement       Right Left    Full Full         Neuro/Psych     Oriented x3: Yes   Mood/Affect: Normal         Dilation     Both eyes: 1.0% Mydriacyl, 2.5% Phenylephrine @ 1:18 PM           Slit Lamp and Fundus Exam     Slit Lamp Exam       Right Left   Lids/Lashes Dermatochalasis - upper lid, mild Ptosis, mild Meibomian gland dysfunction Dermatochalasis - upper lid, mild Ptosis, mild Meibomian gland dysfunction   Conjunctiva/Sclera White and quiet mild temporal Pinguecula   Cornea Mild Arcus, trace Punctate epithelial erosions, tear film debris Mild Arcus, trace Punctate epithelial erosions   Anterior Chamber Deep and quiet Deep and quiet   Iris Round and dilated Round and dilated   Lens Posterior chamber intraocular lens Posterior chamber intraocular lens, 1+Posterior capsular opacification superiorly   Vitreous Vitreous syneresis, Posterior vitreous detachment Vitreous syneresis         Fundus Exam       Right Left   Disc Mild Pallor, Sharp rim, Peripapillary atrophy Mild Pallor, Sharp rim, Peripapillary atrophy   C/D Ratio 0.5 0.5   Macula Blunted foveal reflex, central PED/CNVM, Drusen, RPE mottling, clumping and atrophy, persistent, mild cystic changes--slightly improved Blunted foveal reflex, new CNV with focal heme centrally, Drusen, RPE mottling and clumping, early atraophy   Vessels Vascular attenuation Vascular attenuation, Tortuous   Periphery Attached, No heme  Attached, No heme             IMAGING AND PROCEDURES  Imaging and Procedures for @TODAY @  OCT, Retina - OU - Both Eyes       Right Eye Quality was good. Central Foveal Thickness: 403. Progression has improved. Findings include abnormal foveal contour, outer retinal tubulation, intraretinal fluid, subretinal hyper-reflective material, pigment  epithelial detachment, outer retinal atrophy, no SRF, retinal drusen (Persistent CNV with overlying IRF -- slightly improved).   Left Eye Quality was good. Central Foveal Thickness: 314. Progression has worsened. Findings include no IRF, retinal drusen , outer retinal atrophy, abnormal foveal contour, pigment epithelial detachment, subretinal fluid, subretinal hyper-reflective material, intraretinal hyper-reflective material,  choroidal neovascular membrane (Interval development of central CNV with surrounding shallow SRF).   Notes *Images captured and stored on drive  Diagnosis / Impression:  OD: exu ARMD - Persistent CNV with overlying IRF -- slightly improved OS: exu ARMD -- Interval development of central CNV with surrounding shallow SRF  Clinical management:  See below  Abbreviations: NFP - Normal foveal profile. CME - cystoid macular edema. PED - pigment epithelial detachment. IRF - intraretinal fluid. SRF - subretinal fluid. EZ - ellipsoid zone. ERM - epiretinal membrane. ORA - outer retinal atrophy. ORT - outer retinal tubulation. SRHM - subretinal hyper-reflective material      Intravitreal Injection, Pharmacologic Agent - OD - Right Eye       Time Out 06/29/2021. 2:04 PM. Confirmed correct patient, procedure, site, and patient consented.   Anesthesia Topical anesthesia was used. Anesthetic medications included Lidocaine 2%, Proparacaine 0.5%.   Procedure Preparation included 5% betadine to ocular surface, eyelid speculum. A (32g) needle was used.   Injection: 2 mg aflibercept 2 MG/0.05ML   Route: Intravitreal, Site: Right Eye   NDC: L6038910, Lot: [id number], Expiration date: 04/16/2022, Waste: 0.05 mL   Post-op Post injection exam found visual acuity of at least counting fingers. The patient tolerated the procedure well. There were no complications. The patient received written and verbal post procedure care education. Post injection medications were not given.       Intravitreal Injection, Pharmacologic Agent - OS - Left Eye       Time Out 06/29/2021. 2:04 PM. Confirmed correct patient, procedure, site, and patient consented.   Anesthesia Topical anesthesia was used. Anesthetic medications included Lidocaine 2%, Proparacaine 0.5%.   Procedure Preparation included 5% betadine to ocular surface, eyelid speculum. A supplied needle was used.   Injection: 2 mg aflibercept 2 MG/0.05ML   Route: Intravitreal, Site: Left Eye   NDC: L6038910, Lot: [id number], Expiration date: 03/17/2022, Waste: 0.05 mL   Post-op Post injection exam found visual acuity of at least counting fingers. The patient tolerated the procedure well. There were no complications. The patient received written and verbal post procedure care education. Post injection medications were not given.            ASSESSMENT/PLAN:    ICD-10-CM   1. Exudative age-related macular degeneration of both eyes with active choroidal neovascularization (HCC)  H35.3231 Intravitreal Injection, Pharmacologic Agent - OD - Right Eye    Intravitreal Injection, Pharmacologic Agent - OS - Left Eye    aflibercept (EYLEA) SOLN 2 mg    aflibercept (EYLEA) SOLN 2 mg    2. Retinal edema  H35.81 OCT, Retina - OU - Both Eyes    3. Essential hypertension  I10     4. Hypertensive retinopathy of both eyes  H35.033     5. Pseudophakia of both eyes  Z96.1     6. Left posterior capsular opacification  H26.492      1,2. Exudative age related macular degeneration, both eye  - today, delayed to follow up from 6-8 wks to 1 year  - interval conversion to exu ARMD OS on exam and OCT today, 09.12.22  - previous pt of Dr. Caroline More at [facility] in [place], [place] (last injection was IVE on 02.26.20, and was on a 7-8 injection interval) -- pt living in [place] temporarily with family  - S/P IVE #1 OD (07.08.20), #2 (08.19.20), # 3 (10.14.20), #4 (02.15.21), #4 (04.28.21), #5 (09.17.21)  - OCT shows   OD: Persistent CNV with  overlying IRF -- slightly improved; OS: Interval development of central CNV with surrounding shallow SRF  - BCVA 20/30 OD (improved from 20/40)  - recommend IVE OU (OD #6 and OS #1) today, 09.12.22  - pt wishes to proceed  - RBA of procedure discussed, questions answered  - informed consent obtained  - Eylea informed consent form signed and scanned on 09.12.22 (OU)  - Eylea4U benefits investigation started on April 25, 2019 -- approved for 2021  - see procedure note  - f/u in 4 wks -- DFE/OCT/possible injection  3,4. Hypertensive retinopathy OU  - discussed importance of tight BP control  - monitor  5,6. Pseudophakia OU  - s/p CE/IOL OU  - s/p YAG cap OD  - beautiful surgeries, doing well  - OS with mild noncentral PCO  - monitor  Ophthalmic Meds Ordered this visit:  Meds ordered this encounter  Medications   aflibercept (EYLEA) SOLN 2 mg   aflibercept (EYLEA) SOLN 2 mg      Return in about 4 weeks (around 07/27/2021) for f/u exu ARMD OU, DFE, OCT.  There are no Patient Instructions on file for this visit.  This document serves as a record of services personally performed by Karie Chimera, MD, PhD. It was created on their behalf by Herby Abraham, COA, an ophthalmic technician. The creation of this record is the provider's dictation and/or activities during the visit.    Electronically signed by: Herby Abraham, COA @TODAY @ 2:51 PM  This document serves as a record of services personally performed by , MD, PhD. It was created on their behalf by Karie Chimera. Glee Arvin, OA an ophthalmic technician. The creation of this record is the provider's dictation and/or activities during the visit.    Electronically signed by: Manson Passey. Glee Arvin 09.12.2022 2:51 PM  11.12.2022, M.D., Ph.D. Diseases & Surgery of the Retina and Vitreous [facility] 06/29/2021   I have reviewed the above documentation for accuracy and  completeness, and I agree with the above. 08/29/2021, M.D., Ph.D. 06/29/21 2:55 PM  Abbreviations: M myopia (nearsighted); A astigmatism; H hyperopia (farsighted); P presbyopia; Mrx spectacle prescription;  CTL contact lenses; OD right eye; OS left eye; OU both eyes  XT exotropia; ET esotropia; PEK punctate epithelial keratitis; PEE punctate epithelial erosions; DES dry eye syndrome; MGD meibomian gland dysfunction; ATs artificial tears; PFAT's preservative free artificial tears; NSC nuclear sclerotic cataract; PSC posterior subcapsular cataract; ERM epi-retinal membrane; PVD posterior vitreous detachment; RD retinal detachment; DM diabetes mellitus; DR diabetic retinopathy; NPDR non-proliferative diabetic retinopathy; PDR proliferative diabetic retinopathy; CSME clinically significant macular edema; DME diabetic macular edema; dbh dot blot hemorrhages; CWS cotton wool spot; POAG primary open angle glaucoma; C/D cup-to-disc ratio; HVF humphrey visual field; GVF goldmann visual field; OCT optical coherence tomography; IOP intraocular pressure; BRVO Branch retinal vein occlusion; CRVO central retinal vein occlusion; CRAO central retinal artery occlusion; BRAO branch retinal artery occlusion; RT retinal tear; SB scleral buckle; PPV pars plana vitrectomy; VH Vitreous hemorrhage; PRP panretinal laser photocoagulation; IVK intravitreal kenalog; VMT vitreomacular traction; MH Macular hole;  NVD neovascularization of the disc; NVE neovascularization elsewhere; AREDS age related eye disease study; ARMD age related macular degeneration; POAG primary open angle glaucoma; EBMD epithelial/anterior basement membrane dystrophy; ACIOL anterior chamber intraocular lens; IOL intraocular lens; PCIOL posterior chamber intraocular lens; Phaco/IOL phacoemulsification with intraocular lens placement; PRK photorefractive keratectomy; LASIK laser assisted in situ keratomileusis; HTN hypertension; DM diabetes mellitus; COPD chronic  obstructive pulmonary disease

## 2021-06-29 ENCOUNTER — Ambulatory Visit (INDEPENDENT_AMBULATORY_CARE_PROVIDER_SITE_OTHER): Payer: Medicare PPO | Admitting: Ophthalmology

## 2021-06-29 ENCOUNTER — Encounter (INDEPENDENT_AMBULATORY_CARE_PROVIDER_SITE_OTHER): Payer: Self-pay | Admitting: Ophthalmology

## 2021-06-29 ENCOUNTER — Other Ambulatory Visit: Payer: Self-pay

## 2021-06-29 DIAGNOSIS — H353231 Exudative age-related macular degeneration, bilateral, with active choroidal neovascularization: Secondary | ICD-10-CM | POA: Diagnosis not present

## 2021-06-29 DIAGNOSIS — H353122 Nonexudative age-related macular degeneration, left eye, intermediate dry stage: Secondary | ICD-10-CM

## 2021-06-29 DIAGNOSIS — H35033 Hypertensive retinopathy, bilateral: Secondary | ICD-10-CM | POA: Diagnosis not present

## 2021-06-29 DIAGNOSIS — H3581 Retinal edema: Secondary | ICD-10-CM

## 2021-06-29 DIAGNOSIS — Z961 Presence of intraocular lens: Secondary | ICD-10-CM

## 2021-06-29 DIAGNOSIS — I10 Essential (primary) hypertension: Secondary | ICD-10-CM | POA: Diagnosis not present

## 2021-06-29 DIAGNOSIS — H26492 Other secondary cataract, left eye: Secondary | ICD-10-CM

## 2021-06-29 DIAGNOSIS — H353211 Exudative age-related macular degeneration, right eye, with active choroidal neovascularization: Secondary | ICD-10-CM

## 2021-06-29 MED ORDER — AFLIBERCEPT 2MG/0.05ML IZ SOLN FOR KALEIDOSCOPE
2.0000 mg | INTRAVITREAL | Status: AC | PRN
  Administered 2021-06-29: 2 mg via INTRAVITREAL

## 2021-07-21 NOTE — Progress Notes (Signed)
[facility] - Clinic Note  07/27/2021     CHIEF COMPLAINT Patient presents for Retina Follow Up   HISTORY OF PRESENT ILLNESS: Alexis Cortez is a 87 y.o. female who presents to the clinic today for:   HPI     Retina Follow Up   Patient presents with  Wet AMD.  In both eyes.  This started 4 weeks ago.  I, the attending physician,  performed the HPI with the patient and updated documentation appropriately.        Comments   Patient here for 4 weeks retina follow up for exu ARMD OU. Patient states vision doing fine. No eye pain.       Last edited by Rennis Chris, MD on 07/28/2021 10:57 PM.     Referring physician: April Manson, NP [phone] [address] [place],  [place] [address]  HISTORICAL INFORMATION:  Selected notes from the MEDICAL RECORD NUMBER Referred by [facility], [place], [place] for concern of exu ARMD LEE: 02.06.20 (Shil K. Patel) [BCVA: OD: 20/30+2 OS: 20/25 Ocular Hx-PCO OS, exu ARMD OD, non-exu ARMD OS, s/p YAG cap OD, s/p IVE OD 02.26.20  PMH-HTN, depression, anxiety   CURRENT MEDICATIONS: No current outpatient medications on file. (Ophthalmic Drugs)   No current facility-administered medications for this visit. (Ophthalmic Drugs)   Current Outpatient Medications (Other)  Medication Sig   albuterol (VENTOLIN HFA) 108 (90 Base) MCG/ACT inhaler Inhale 2 puffs into the lungs every 6 (six) hours as needed for wheezing or shortness of breath.   amLODipine (NORVASC) 5 MG tablet Take 1 tablet (5 mg total) by mouth daily.   benzonatate (TESSALON) 100 MG capsule Take 100 mg by mouth 3 (three) times daily.   busPIRone (BUSPAR) 7.5 MG tablet Take 7.5 mg by mouth 2 (two) times daily.    [employer] PURELAX 17 GM/SCOOP powder Take 17 g by mouth daily as needed for moderate constipation.    docusate sodium (COLACE) 100 MG capsule Take 100 mg by mouth daily as needed for mild constipation.    esomeprazole (NEXIUM) 40 MG capsule Take by mouth.    levothyroxine (SYNTHROID) 75 MCG tablet Take 75 mcg by mouth daily.   lisinopril (ZESTRIL) 10 MG tablet Take 10 mg by mouth daily.    mirtazapine (REMERON) 15 MG tablet Take 15 mg by mouth at bedtime.    Misc. Devices MISC Knee high compression stockings 18-72mmHg pressure to be worn during daytime hours and off at night Dx: peripheral edema   Multiple Vitamin tablet Take 1 tablet by mouth daily.    pregabalin (LYRICA) 50 MG capsule Take 50 mg by mouth daily.    traZODone (DESYREL) 100 MG tablet Take 100 mg by mouth at bedtime.    No current facility-administered medications for this visit. (Other)   REVIEW OF SYSTEMS: ROS   Positive for: Eyes Negative for: Constitutional, Gastrointestinal, Neurological, Skin, Genitourinary, Musculoskeletal, HENT, Endocrine, Cardiovascular, Respiratory, Psychiatric, Allergic/Imm, Heme/Lymph Last edited by Laddie Aquas, COA on 07/27/2021  1:40 PM.    ALLERGIES No Known Allergies  PAST MEDICAL HISTORY Past Medical History:  Diagnosis Date   Hypertension    Hypertensive retinopathy    OU   Macular degeneration    Wet OD, Dry OS   Past Surgical History:  Procedure Laterality Date   CATARACT EXTRACTION Bilateral    EYE SURGERY     YAG LASER APPLICATION Right    FAMILY HISTORY History reviewed. No pertinent family history.  SOCIAL HISTORY Social History   Tobacco Use   Smoking status: Never   Smokeless tobacco: Never  Vaping Use   Vaping Use: Never used  Substance Use Topics   Alcohol use: Yes    Alcohol/week: 2.0 standard drinks    Types: 2 Standard drinks or equivalent per week   Drug use: Never       OPHTHALMIC EXAM:  Base Eye Exam     Visual Acuity (Snellen - Linear)       Right Left   Dist [place] 20/40 -2 20/60 -2   Dist ph [place] 20/40 +2 20/40 -2         Tonometry (Tonopen, 1:38 PM)       Right Left   Pressure 20 15         Pupils       Dark Light Shape React APD   Right 3 2 Round Brisk None   Left 3 2 Round  Brisk None         Visual Fields (Counting fingers)       Left Right    Full Full         Extraocular Movement       Right Left    Full Full         Neuro/Psych     Oriented x3: Yes   Mood/Affect: Normal         Dilation     Both eyes: 1.0% Mydriacyl, 2.5% Phenylephrine @ 1:38 PM           Slit Lamp and Fundus Exam     Slit Lamp Exam       Right Left   Lids/Lashes Dermatochalasis - upper lid, mild Ptosis, mild Meibomian gland dysfunction Dermatochalasis - upper lid, mild Ptosis, mild Meibomian gland dysfunction   Conjunctiva/Sclera White and quiet mild temporal Pinguecula   Cornea Mild Arcus, trace Punctate epithelial erosions, tear film debris Mild Arcus, trace Punctate epithelial erosions   Anterior Chamber Deep and quiet Deep and quiet   Iris Round and dilated Round and dilated   Lens Posterior chamber intraocular lens Posterior chamber intraocular lens, 1+Posterior capsular opacification superiorly   Vitreous Vitreous syneresis, Posterior vitreous detachment Vitreous syneresis         Fundus Exam       Right Left   Disc Mild Pallor, Sharp rim, Peripapillary atrophy Mild Pallor, Sharp rim, Peripapillary atrophy   C/D Ratio 0.5 0.5   Macula Blunted foveal reflex, central PED/CNVM, Drusen, RPE mottling, clumping and atrophy, persistent, mild cystic changes--slightly improved Blunted foveal reflex, new CNV with focal heme centrally - improved, Drusen, RPE mottling and clumping, early atraophy   Vessels Vascular attenuation Vascular attenuation, Tortuous   Periphery Attached, No heme  Attached, No heme            IMAGING AND PROCEDURES  Imaging and Procedures for @TODAY @  OCT, Retina - OU - Both Eyes       Right Eye Quality was good. Central Foveal Thickness: 379. Progression has improved. Findings include abnormal foveal contour, outer retinal tubulation, intraretinal fluid, subretinal hyper-reflective material, pigment epithelial detachment,  outer retinal atrophy, no SRF, retinal drusen (Persistent central CNV with overlying IRF -- slightly improved).   Left Eye Quality was good. Central Foveal Thickness: 269. Progression has improved. Findings include no IRF, retinal drusen , outer retinal atrophy, abnormal foveal contour, pigment epithelial detachment, subretinal hyper-reflective material, intraretinal hyper-reflective material, choroidal neovascular membrane, no SRF (Interval improvement in central [facility]  and SRF).   Notes *Images captured and stored on drive  Diagnosis / Impression:  OD: exu ARMD - Persistent central CNV with overlying IRF -- slightly improved OS: exu ARMD -- Interval improvement in central [facility] and SRF  Clinical management:  See below  Abbreviations: NFP - Normal foveal profile. CME - cystoid macular edema. PED - pigment epithelial detachment. IRF - intraretinal fluid. SRF - subretinal fluid. EZ - ellipsoid zone. ERM - epiretinal membrane. ORA - outer retinal atrophy. ORT - outer retinal tubulation. SRHM - subretinal hyper-reflective material      Intravitreal Injection, Pharmacologic Agent - OD - Right Eye       Time Out 07/27/2021. 2:40 PM. Confirmed correct patient, procedure, site, and patient consented.   Anesthesia Topical anesthesia was used. Anesthetic medications included Lidocaine 2%, Proparacaine 0.5%.   Procedure Preparation included 5% betadine to ocular surface, eyelid speculum. A (32g) needle was used.   Injection: 2 mg aflibercept 2 MG/0.05ML   Route: Intravitreal, Site: Right Eye   NDC: L6038910, Lot: [id number], Expiration date: 06/16/2022, Waste: 0.05 mL   Post-op Post injection exam found visual acuity of at least counting fingers. The patient tolerated the procedure well. There were no complications. The patient received written and verbal post procedure care education. Post injection medications were not given.      Intravitreal Injection, Pharmacologic Agent - OS -  Left Eye       Time Out 07/27/2021. 2:41 PM. Confirmed correct patient, procedure, site, and patient consented.   Anesthesia Topical anesthesia was used. Anesthetic medications included Lidocaine 2%, Proparacaine 0.5%.   Procedure Preparation included 5% betadine to ocular surface, eyelid speculum. A supplied needle was used.   Injection: 2 mg aflibercept 2 MG/0.05ML   Route: Intravitreal, Site: Left Eye   NDC: L6038910, Lot: [id number], Expiration date: 06/16/2022, Waste: 0.05 mL   Post-op Post injection exam found visual acuity of at least counting fingers. The patient tolerated the procedure well. There were no complications. The patient received written and verbal post procedure care education. Post injection medications were not given.            ASSESSMENT/PLAN:   ICD-10-CM   1. Exudative age-related macular degeneration of both eyes with active choroidal neovascularization (HCC)  H35.3231 Intravitreal Injection, Pharmacologic Agent - OD - Right Eye    Intravitreal Injection, Pharmacologic Agent - OS - Left Eye    aflibercept (EYLEA) SOLN 2 mg    aflibercept (EYLEA) SOLN 2 mg    2. Retinal edema  H35.81 OCT, Retina - OU - Both Eyes    3. Essential hypertension  I10     4. Hypertensive retinopathy of both eyes  H35.033     5. Pseudophakia of both eyes  Z96.1     6. Left posterior capsular opacification  H26.492     1,2. Exudative age related macular degeneration, both eyes  - interval conversion to exu ARMD OS on exam and OCT on 09.12.22  - previous pt of Dr. Caroline More at [facility] in [place], [place] (last injection was IVE on 02.26.20, and was on a 7-8 injection interval) -- pt living in [place] temporarily with family  - S/P IVE #1 OD (07.08.20), #2 (08.19.20), # 3 (10.14.20), #4 (02.15.21), #4 (04.28.21), #5 (09.17.21), #6 (09.12.22)             - S/P IVE OS #1 (09.12.22)  - OCT shows OD: Persistent central CNV with overlying IRF -- slightly  improved; OS:  exu ARMD -- Interval improvement in central SRHM and SRF  - BCVA 20/30 OD (improved from 20/40)  - recommend IVE OU (OD #7 and OS #2) today, 10.10.22  - pt wishes to proceed  - RBA of procedure discussed, questions answered  - informed consent obtained  - Eylea informed consent form signed and scanned on 09.12.22 (OU)  - Eylea4U benefits investigation started on April 25, 2019 -- approved for 2021  - see procedure note  - f/u in 4 wks -- DFE/OCT/possible injection  3,4. Hypertensive retinopathy OU  - discussed importance of tight BP control  - monitor  5,6. Pseudophakia OU  - s/p CE/IOL OU  - s/p YAG cap OD  - beautiful surgeries, doing well  - OS with mild noncentral PCO  - monitor  Ophthalmic Meds Ordered this visit:  Meds ordered this encounter  Medications   aflibercept (EYLEA) SOLN 2 mg   aflibercept (EYLEA) SOLN 2 mg     Return in about 4 weeks (around 08/24/2021) for f/u exu ARMD OU, DFE, OCT.  There are no Patient Instructions on file for this visit.  This document serves as a record of services personally performed by Karie Chimera, MD, PhD. It was created on their behalf by Herby Abraham, COA, an ophthalmic technician. The creation of this record is the provider's dictation and/or activities during the visit.    Electronically signed by: Herby Abraham, COA @TODAY @ 11:04 PM  , M.D., Ph.D. Diseases & Surgery of the Retina and Vitreous [facility] 07/27/2021   I have reviewed the above documentation for accuracy and completeness, and I agree with the above. 09/26/2021, M.D., Ph.D. 07/28/21 11:04 PM  Abbreviations: M myopia (nearsighted); A astigmatism; H hyperopia (farsighted); P presbyopia; Mrx spectacle prescription;  CTL contact lenses; OD right eye; OS left eye; OU both eyes  XT exotropia; ET esotropia; PEK punctate epithelial keratitis; PEE punctate epithelial erosions; DES dry eye syndrome; MGD  meibomian gland dysfunction; ATs artificial tears; PFAT's preservative free artificial tears; NSC nuclear sclerotic cataract; PSC posterior subcapsular cataract; ERM epi-retinal membrane; PVD posterior vitreous detachment; RD retinal detachment; DM diabetes mellitus; DR diabetic retinopathy; NPDR non-proliferative diabetic retinopathy; PDR proliferative diabetic retinopathy; CSME clinically significant macular edema; DME diabetic macular edema; dbh dot blot hemorrhages; CWS cotton wool spot; POAG primary open angle glaucoma; C/D cup-to-disc ratio; HVF humphrey visual field; GVF goldmann visual field; OCT optical coherence tomography; IOP intraocular pressure; BRVO Branch retinal vein occlusion; CRVO central retinal vein occlusion; CRAO central retinal artery occlusion; BRAO branch retinal artery occlusion; RT retinal tear; SB scleral buckle; PPV pars plana vitrectomy; VH Vitreous hemorrhage; PRP panretinal laser photocoagulation; IVK intravitreal kenalog; VMT vitreomacular traction; MH Macular hole;  NVD neovascularization of the disc; NVE neovascularization elsewhere; AREDS age related eye disease study; ARMD age related macular degeneration; POAG primary open angle glaucoma; EBMD epithelial/anterior basement membrane dystrophy; ACIOL anterior chamber intraocular lens; IOL intraocular lens; PCIOL posterior chamber intraocular lens; Phaco/IOL phacoemulsification with intraocular lens placement; PRK photorefractive keratectomy; LASIK laser assisted in situ keratomileusis; HTN hypertension; DM diabetes mellitus; COPD chronic obstructive pulmonary disease

## 2021-07-27 ENCOUNTER — Other Ambulatory Visit: Payer: Self-pay

## 2021-07-27 ENCOUNTER — Ambulatory Visit (INDEPENDENT_AMBULATORY_CARE_PROVIDER_SITE_OTHER): Payer: Medicare PPO | Admitting: Ophthalmology

## 2021-07-27 ENCOUNTER — Encounter (INDEPENDENT_AMBULATORY_CARE_PROVIDER_SITE_OTHER): Payer: Self-pay | Admitting: Ophthalmology

## 2021-07-27 DIAGNOSIS — H35033 Hypertensive retinopathy, bilateral: Secondary | ICD-10-CM | POA: Diagnosis not present

## 2021-07-27 DIAGNOSIS — I10 Essential (primary) hypertension: Secondary | ICD-10-CM

## 2021-07-27 DIAGNOSIS — Z961 Presence of intraocular lens: Secondary | ICD-10-CM

## 2021-07-27 DIAGNOSIS — H26492 Other secondary cataract, left eye: Secondary | ICD-10-CM

## 2021-07-27 DIAGNOSIS — H353231 Exudative age-related macular degeneration, bilateral, with active choroidal neovascularization: Secondary | ICD-10-CM

## 2021-07-27 DIAGNOSIS — H353211 Exudative age-related macular degeneration, right eye, with active choroidal neovascularization: Secondary | ICD-10-CM

## 2021-07-27 DIAGNOSIS — H3581 Retinal edema: Secondary | ICD-10-CM

## 2021-07-27 DIAGNOSIS — H353122 Nonexudative age-related macular degeneration, left eye, intermediate dry stage: Secondary | ICD-10-CM

## 2021-07-28 ENCOUNTER — Encounter (INDEPENDENT_AMBULATORY_CARE_PROVIDER_SITE_OTHER): Payer: Self-pay | Admitting: Ophthalmology

## 2021-07-28 MED ORDER — AFLIBERCEPT 2MG/0.05ML IZ SOLN FOR KALEIDOSCOPE
2.0000 mg | INTRAVITREAL | Status: AC | PRN
  Administered 2021-07-27: 2 mg via INTRAVITREAL

## 2021-08-20 NOTE — Progress Notes (Shared)
[facility] Clinic Note  08/24/2021     CHIEF COMPLAINT Patient presents for No chief complaint on file.   HISTORY OF PRESENT ILLNESS: Alexis Cortez is a 87 y.o. female who presents to the clinic today for:     Referring physician: Jettie Booze, NP [place] [address]  [place] [address]  HISTORICAL INFORMATION:  Selected notes from the [place] Referred by [place], [place] for concern of exu ARMD LEE: 02.06.20 (Shil K. Patel) [BCVA: OD: 20/30+2 OS: 20/25 Ocular Hx-PCO OS, exu ARMD OD, non-exu ARMD OS, s/p YAG cap OD, s/p IVE OD 02.26.20  PMH-HTN, depression, anxiety   CURRENT MEDICATIONS: No current outpatient medications on file. (Ophthalmic Drugs)   No current facility-administered medications for this visit. (Ophthalmic Drugs)   Current Outpatient Medications (Other)  Medication Sig   albuterol (VENTOLIN HFA) 108 (90 Base) MCG/ACT inhaler Inhale 2 puffs into the lungs every 6 (six) hours as needed for wheezing or shortness of breath.   amLODipine (NORVASC) 5 MG tablet Take 1 tablet (5 mg total) by mouth daily.   benzonatate (TESSALON) 100 MG capsule Take 100 mg by mouth 3 (three) times daily.   busPIRone (BUSPAR) 7.5 MG tablet Take 7.5 mg by mouth 2 (two) times daily.    CVS PURELAX 17 GM/SCOOP powder Take 17 g by mouth daily as needed for moderate constipation.    docusate sodium (COLACE) 100 MG capsule Take 100 mg by mouth daily as needed for mild constipation.    esomeprazole (NEXIUM) 40 MG capsule Take by mouth.   levothyroxine (SYNTHROID) 75 MCG tablet Take 75 mcg by mouth daily.   lisinopril (ZESTRIL) 10 MG tablet Take 10 mg by mouth daily.    mirtazapine (REMERON) 15 MG tablet Take 15 mg by mouth at bedtime.    Misc. Devices MISC Knee high compression stockings 18-69mmHg pressure to be worn during daytime hours and off at night Dx: peripheral edema   Multiple Vitamin tablet Take 1 tablet by mouth  daily.    pregabalin (LYRICA) 50 MG capsule Take 50 mg by mouth daily.    traZODone (DESYREL) 100 MG tablet Take 100 mg by mouth at bedtime.    No current facility-administered medications for this visit. (Other)   REVIEW OF SYSTEMS:  ALLERGIES No Known Allergies  PAST MEDICAL HISTORY Past Medical History:  Diagnosis Date   Hypertension    Hypertensive retinopathy    OU   Macular degeneration    Wet OD, Dry OS   Past Surgical History:  Procedure Laterality Date   CATARACT EXTRACTION Bilateral    EYE SURGERY     YAG LASER APPLICATION Right    FAMILY HISTORY No family history on file.  SOCIAL HISTORY Social History   Tobacco Use   Smoking status: Never   Smokeless tobacco: Never  Vaping Use   Vaping Use: Never used  Substance Use Topics   Alcohol use: Yes    Alcohol/week: 2.0 standard drinks    Types: 2 Standard drinks or equivalent per week   Drug use: Never       OPHTHALMIC EXAM:  Not recorded    IMAGING AND PROCEDURES  Imaging and Procedures for @TODAY @          ASSESSMENT/PLAN:   ICD-10-CM   1. Exudative age-related macular degeneration of both eyes with active choroidal neovascularization ([place])  H35.3231     2. Retinal edema  H35.81  3. Essential hypertension  I10     4. Hypertensive retinopathy of both eyes  H35.033     5. Pseudophakia of both eyes  Z96.1     6. Left posterior capsular opacification  H26.492     7. Exudative age-related macular degeneration of right eye with active choroidal neovascularization (HCC)  H35.3211     8. Intermediate stage nonexudative age-related macular degeneration of left eye  H35.3122     1,2. Exudative age related macular degeneration, both eyes  - interval conversion to exu ARMD OS on exam and OCT on 09.12.22  - previous pt of Dr. Caroline More at [facility] in [place], [place] (last injection was IVE on 02.26.20, and was on a 7-8 injection interval) -- pt living in [place] temporarily with  family  - S/P IVE #1 OD (07.08.20), #2 (08.19.20), # 3 (10.14.20), #4 (02.15.21), #4 (04.28.21), #5 (09.17.21), #6 (09.12.22), #7 (10.10.22)             - S/P IVE OS #1 (09.12.22), #2 (10.10.22)  - OCT shows OD: Persistent central CNV with overlying IRF -- slightly improved; OS: exu ARMD -- Interval improvement in central SRHM and SRF  - BCVA 20/30 OD (improved from 20/40)  - recommend IVE OU (OD #8 and OS #3) today, 11.07.22  - pt wishes to proceed  - RBA of procedure discussed, questions answered  - informed consent obtained  - Eylea informed consent form signed and scanned on 09.12.22 (OU)  - Eylea4U benefits investigation started on April 25, 2019 -- approved for 2021  - see procedure note  - f/u in 4 wks -- DFE/OCT/possible injection  3,4. Hypertensive retinopathy OU  - discussed importance of tight BP control  - monitor  5,6. Pseudophakia OU  - s/p CE/IOL OU  - s/p YAG cap OD  - beautiful surgeries, doing well  - OS with mild noncentral PCO  - monitor  Ophthalmic Meds Ordered this visit:  No orders of the defined types were placed in this encounter.    No follow-ups on file.  There are no Patient Instructions on file for this visit.  This document serves as a record of services personally performed by Karie Chimera, MD, PhD. It was created on their behalf by Herby Abraham, COA, an ophthalmic technician. The creation of this record is the provider's dictation and/or activities during the visit.    Electronically signed by: Herby Abraham, COA @TODAY @ 10:00 AM   Abbreviations: M myopia (nearsighted); A astigmatism; H hyperopia (farsighted); P presbyopia; Mrx spectacle prescription;  CTL contact lenses; OD right eye; OS left eye; OU both eyes  XT exotropia; ET esotropia; PEK punctate epithelial keratitis; PEE punctate epithelial erosions; DES dry eye syndrome; MGD meibomian gland dysfunction; ATs artificial tears; PFAT's preservative free artificial tears; NSC nuclear  sclerotic cataract; PSC posterior subcapsular cataract; ERM epi-retinal membrane; PVD posterior vitreous detachment; RD retinal detachment; DM diabetes mellitus; DR diabetic retinopathy; NPDR non-proliferative diabetic retinopathy; PDR proliferative diabetic retinopathy; CSME clinically significant macular edema; DME diabetic macular edema; dbh dot blot hemorrhages; CWS cotton wool spot; POAG primary open angle glaucoma; C/D cup-to-disc ratio; HVF humphrey visual field; GVF goldmann visual field; OCT optical coherence tomography; IOP intraocular pressure; BRVO Branch retinal vein occlusion; CRVO central retinal vein occlusion; CRAO central retinal artery occlusion; BRAO branch retinal artery occlusion; RT retinal tear; SB scleral buckle; PPV pars plana vitrectomy; VH Vitreous hemorrhage; PRP panretinal laser photocoagulation; IVK intravitreal kenalog; VMT vitreomacular traction; MH Macular hole;  NVD neovascularization of the disc; NVE neovascularization elsewhere; AREDS age related eye disease study; ARMD age related macular degeneration; POAG primary open angle glaucoma; EBMD epithelial/anterior basement membrane dystrophy; ACIOL anterior chamber intraocular lens; IOL intraocular lens; PCIOL posterior chamber intraocular lens; Phaco/IOL phacoemulsification with intraocular lens placement; [place] photorefractive keratectomy; LASIK laser assisted in situ keratomileusis; HTN hypertension; DM diabetes mellitus; COPD chronic obstructive pulmonary disease

## 2021-08-24 ENCOUNTER — Encounter (INDEPENDENT_AMBULATORY_CARE_PROVIDER_SITE_OTHER): Payer: Medicare PPO | Admitting: Ophthalmology

## 2021-08-24 DIAGNOSIS — I10 Essential (primary) hypertension: Secondary | ICD-10-CM

## 2021-08-24 DIAGNOSIS — H3581 Retinal edema: Secondary | ICD-10-CM

## 2021-08-24 DIAGNOSIS — H353122 Nonexudative age-related macular degeneration, left eye, intermediate dry stage: Secondary | ICD-10-CM

## 2021-08-24 DIAGNOSIS — H35033 Hypertensive retinopathy, bilateral: Secondary | ICD-10-CM

## 2021-08-24 DIAGNOSIS — Z961 Presence of intraocular lens: Secondary | ICD-10-CM

## 2021-08-24 DIAGNOSIS — H353211 Exudative age-related macular degeneration, right eye, with active choroidal neovascularization: Secondary | ICD-10-CM

## 2021-08-24 DIAGNOSIS — H353231 Exudative age-related macular degeneration, bilateral, with active choroidal neovascularization: Secondary | ICD-10-CM

## 2021-08-24 DIAGNOSIS — H26492 Other secondary cataract, left eye: Secondary | ICD-10-CM

## 2021-09-04 NOTE — Progress Notes (Signed)
[facility] - Clinic Note  09/07/2021     CHIEF COMPLAINT Patient presents for Retina Follow Up   HISTORY OF PRESENT ILLNESS: Alexis Cortez is a 87 y.o. female who presents to the clinic today for:   HPI     Retina Follow Up   Patient presents with  Wet AMD.  In both eyes.  This started 4 weeks ago.  I, the attending physician,  performed the HPI with the patient and updated documentation appropriately.        Comments   Patient here for 4 weeks retina follow up for exu ARMD OU. Patient states vision doing fine. No eye pain.       Last edited by Rennis Chris, MD on 09/07/2021  3:43 PM.     Referring physician: April Manson, NP [phone] [address] [place],  [place] [address]  HISTORICAL INFORMATION:  Selected notes from the MEDICAL RECORD NUMBER Referred by [facility], [place], [place] for concern of exu ARMD LEE: 02.06.20 (Shil K. Patel) [BCVA: OD: 20/30+2 OS: 20/25 Ocular Hx-PCO OS, exu ARMD OD, non-exu ARMD OS, s/p YAG cap OD, s/p IVE OD 02.26.20  PMH-HTN, depression, anxiety   CURRENT MEDICATIONS: No current outpatient medications on file. (Ophthalmic Drugs)   No current facility-administered medications for this visit. (Ophthalmic Drugs)   Current Outpatient Medications (Other)  Medication Sig   albuterol (VENTOLIN HFA) 108 (90 Base) MCG/ACT inhaler Inhale 2 puffs into the lungs every 6 (six) hours as needed for wheezing or shortness of breath.   amLODipine (NORVASC) 5 MG tablet Take 1 tablet (5 mg total) by mouth daily.   benzonatate (TESSALON) 100 MG capsule Take 100 mg by mouth 3 (three) times daily.   busPIRone (BUSPAR) 7.5 MG tablet Take 7.5 mg by mouth 2 (two) times daily.    [employer] PURELAX 17 GM/SCOOP powder Take 17 g by mouth daily as needed for moderate constipation.    docusate sodium (COLACE) 100 MG capsule Take 100 mg by mouth daily as needed for mild constipation.    esomeprazole (NEXIUM) 40 MG capsule Take by mouth.    levothyroxine (SYNTHROID) 75 MCG tablet Take 75 mcg by mouth daily.   lisinopril (ZESTRIL) 10 MG tablet Take 10 mg by mouth daily.    mirtazapine (REMERON) 15 MG tablet Take 15 mg by mouth at bedtime.    Misc. Devices MISC Knee high compression stockings 18-28mmHg pressure to be worn during daytime hours and off at night Dx: peripheral edema   Multiple Vitamin tablet Take 1 tablet by mouth daily.    pregabalin (LYRICA) 50 MG capsule Take 50 mg by mouth daily.    traZODone (DESYREL) 100 MG tablet Take 100 mg by mouth at bedtime.    No current facility-administered medications for this visit. (Other)   REVIEW OF SYSTEMS: ROS   Positive for: Eyes Negative for: Constitutional, Gastrointestinal, Neurological, Skin, Genitourinary, Musculoskeletal, HENT, Endocrine, Cardiovascular, Respiratory, Psychiatric, Allergic/Imm, Heme/Lymph Last edited by Laddie Aquas, COA on 09/07/2021  1:57 PM.     ALLERGIES No Known Allergies  PAST MEDICAL HISTORY Past Medical History:  Diagnosis Date   Hypertension    Hypertensive retinopathy    OU   Macular degeneration    Wet OD, Dry OS   Past Surgical History:  Procedure Laterality Date   CATARACT EXTRACTION Bilateral    EYE SURGERY     YAG LASER APPLICATION Right    FAMILY HISTORY History reviewed. No pertinent  family history.  SOCIAL HISTORY Social History   Tobacco Use   Smoking status: Never   Smokeless tobacco: Never  Vaping Use   Vaping Use: Never used  Substance Use Topics   Alcohol use: Yes    Alcohol/week: 2.0 standard drinks    Types: 2 Standard drinks or equivalent per week   Drug use: Never       OPHTHALMIC EXAM:  Base Eye Exam     Visual Acuity (Snellen - Linear)       Right Left   Dist [place] 20/40 -2 20/30 -2   Dist ph [place] 20/40 +2 20/25 -2         Tonometry (Tonopen, 1:54 PM)       Right Left   Pressure 13 13         Pupils       Dark Light Shape React APD   Right 3 2 Round Brisk None   Left 3 2  Round Brisk None         Visual Fields (Counting fingers)       Left Right    Full Full         Extraocular Movement       Right Left    Full, Ortho Full, Ortho         Neuro/Psych     Oriented x3: Yes   Mood/Affect: Normal         Dilation     Both eyes: 1.0% Mydriacyl, 2.5% Phenylephrine @ 1:54 PM           Slit Lamp and Fundus Exam     Slit Lamp Exam       Right Left   Lids/Lashes Dermatochalasis - upper lid, mild Ptosis, mild Meibomian gland dysfunction Dermatochalasis - upper lid, mild Ptosis, mild Meibomian gland dysfunction   Conjunctiva/Sclera White and quiet mild temporal Pinguecula   Cornea Mild Arcus, trace Punctate epithelial erosions, tear film debris Mild Arcus, trace Punctate epithelial erosions   Anterior Chamber Deep and quiet Deep and quiet   Iris Round and dilated Round and dilated   Lens Posterior chamber intraocular lens Posterior chamber intraocular lens, 1+Posterior capsular opacification superiorly   Anterior Vitreous Vitreous syneresis, Posterior vitreous detachment Vitreous syneresis         Fundus Exam       Right Left   Disc Mild Pallor, Sharp rim, Peripapillary atrophy Mild Pallor, Sharp rim, Peripapillary atrophy   C/D Ratio 0.5 0.5   Macula Blunted foveal reflex, central PED/CNVM, Drusen, RPE mottling, clumping and atrophy, cystic changes--stable improved Blunted foveal reflex, CNV with focal heme centrally - improved, Drusen, RPE mottling and clumping, early atraophy   Vessels Vascular attenuation Vascular attenuation, Tortuous   Periphery Attached, No heme  Attached, No heme            IMAGING AND PROCEDURES  Imaging and Procedures for @TODAY @  OCT, Retina - OU - Both Eyes       Right Eye Quality was good. Central Foveal Thickness: 365. Progression has been stable. Findings include abnormal foveal contour, outer retinal tubulation, intraretinal fluid, subretinal hyper-reflective material, pigment epithelial  detachment, outer retinal atrophy, no SRF, retinal drusen (Stable improvement in IRF overlying central SRHM/CNV).   Left Eye Quality was good. Central Foveal Thickness: 262. Progression has been stable. Findings include no IRF, retinal drusen , outer retinal atrophy, abnormal foveal contour, pigment epithelial detachment, subretinal hyper-reflective material, intraretinal hyper-reflective material, choroidal neovascular membrane, no SRF (Stable improvement  in central [facility] and SRF).   Notes *Images captured and stored on drive  Diagnosis / Impression:  OD: exu ARMD - Stable improvement in IRF overlying central SRHM/CNV OS: exu ARMD -- Stable improvement in central [facility] and SRF  Clinical management:  See below  Abbreviations: NFP - Normal foveal profile. CME - cystoid macular edema. PED - pigment epithelial detachment. IRF - intraretinal fluid. SRF - subretinal fluid. EZ - ellipsoid zone. ERM - epiretinal membrane. ORA - outer retinal atrophy. ORT - outer retinal tubulation. SRHM - subretinal hyper-reflective material      Intravitreal Injection, Pharmacologic Agent - OD - Right Eye       Time Out 09/07/2021. 2:45 PM. Confirmed correct patient, procedure, site, and patient consented.   Anesthesia Topical anesthesia was used. Anesthetic medications included Lidocaine 2%, Proparacaine 0.5%.   Procedure Preparation included 5% betadine to ocular surface, eyelid speculum. A (32g) needle was used.   Injection: 2 mg aflibercept 2 MG/0.05ML   Route: Intravitreal, Site: Right Eye   NDC: L6038910, Lot: [id number], Expiration date: 07/17/2022, Waste: 0.05 mL   Post-op Post injection exam found visual acuity of at least counting fingers. The patient tolerated the procedure well. There were no complications. The patient received written and verbal post procedure care education. Post injection medications were not given.      Intravitreal Injection, Pharmacologic Agent - OS - Left Eye        Time Out 09/07/2021. 2:46 PM. Confirmed correct patient, procedure, site, and patient consented.   Anesthesia Topical anesthesia was used. Anesthetic medications included Lidocaine 2%, Proparacaine 0.5%.   Procedure Preparation included 5% betadine to ocular surface, eyelid speculum. A (32g) needle was used.   Injection: 2 mg aflibercept 2 MG/0.05ML   Route: Intravitreal, Site: Left Eye   NDC: W6696518, Lot: [id number], Expiration date: 02/14/2022, Waste: 0.05 mL   Post-op Post injection exam found visual acuity of at least counting fingers. The patient tolerated the procedure well. There were no complications. The patient received written and verbal post procedure care education. Post injection medications were not given.            ASSESSMENT/PLAN:   ICD-10-CM   1. Exudative age-related macular degeneration of both eyes with active choroidal neovascularization (HCC)  H35.3231 Intravitreal Injection, Pharmacologic Agent - OD - Right Eye    Intravitreal Injection, Pharmacologic Agent - OS - Left Eye    aflibercept (EYLEA) SOLN 2 mg    aflibercept (EYLEA) SOLN 2 mg    2. Retinal edema  H35.81 OCT, Retina - OU - Both Eyes    3. Essential hypertension  I10     4. Hypertensive retinopathy of both eyes  H35.033     5. Pseudophakia of both eyes  Z96.1     6. Left posterior capsular opacification  H26.492      1,2. Exudative age related macular degeneration, both eyes  - interval conversion to exu ARMD OS on exam and OCT on 09.12.22  - previous pt of Dr. Caroline More at [facility] in [place], [place] (last injection was IVE on 02.26.20, and was on a 7-8 injection interval) -- pt living in [place] temporarily with family  - S/P IVE #1 OD (07.08.20), #2 (08.19.20), # 3 (10.14.20), #4 (02.15.21), #4 (04.28.21), #5 (09.17.21), #6 (09.12.22), #7 (10.10.22)             - S/P IVE OS #1 (09.12.22), #2 (10.10.22)  - OCT shows stable improvement in IRF overlying central  SRHM/CNV; OS: Stable improvement in central SRHM and SRF  - BCVA 20/40 OD, 20/25 OS  - recommend IVE OU (OD #8 and OS #3) today, 11.21.22  - pt wishes to proceed  - RBA of procedure discussed, questions answered  - informed consent obtained  - Eylea informed consent form signed and scanned on 09.12.22 (OU)  - Eylea4U benefits investigation started on April 25, 2019 -- approved for 2021  - see procedure note  - f/u in 4 wks -- DFE/OCT/possible injection  3,4. Hypertensive retinopathy OU  - discussed importance of tight BP control  - monitor  5,6. Pseudophakia OU  - s/p CE/IOL OU  - s/p YAG cap OD  - beautiful surgeries, doing well  - OS with mild noncentral PCO  - monitor  Ophthalmic Meds Ordered this visit:  Meds ordered this encounter  Medications   aflibercept (EYLEA) SOLN 2 mg   aflibercept (EYLEA) SOLN 2 mg     Return in about 4 weeks (around 10/05/2021) for f/u exu ARMD OU, DFE, OCT.  There are no Patient Instructions on file for this visit.  This document serves as a record of services personally performed by Karie Chimera, MD, PhD. It was created on their behalf by Annalee Genta, COMT. The creation of this record is the provider's dictation and/or activities during the visit.  Electronically signed by: Annalee Genta, COMT 09/07/21 4:07 PM  Karie Chimera, M.D., Ph.D. Diseases & Surgery of the Retina and Vitreous [facility] 07/27/2021   I have reviewed the above documentation for accuracy and completeness, and I agree with the above. Karie Chimera, M.D., Ph.D. 09/07/21 4:07 PM  Abbreviations: M myopia (nearsighted); A astigmatism; H hyperopia (farsighted); P presbyopia; Mrx spectacle prescription;  CTL contact lenses; OD right eye; OS left eye; OU both eyes  XT exotropia; ET esotropia; PEK punctate epithelial keratitis; PEE punctate epithelial erosions; DES dry eye syndrome; MGD meibomian gland dysfunction; ATs artificial tears; PFAT's  preservative free artificial tears; NSC nuclear sclerotic cataract; PSC posterior subcapsular cataract; ERM epi-retinal membrane; PVD posterior vitreous detachment; RD retinal detachment; DM diabetes mellitus; DR diabetic retinopathy; NPDR non-proliferative diabetic retinopathy; PDR proliferative diabetic retinopathy; CSME clinically significant macular edema; DME diabetic macular edema; dbh dot blot hemorrhages; CWS cotton wool spot; POAG primary open angle glaucoma; C/D cup-to-disc ratio; HVF humphrey visual field; GVF goldmann visual field; OCT optical coherence tomography; IOP intraocular pressure; BRVO Branch retinal vein occlusion; CRVO central retinal vein occlusion; CRAO central retinal artery occlusion; BRAO branch retinal artery occlusion; RT retinal tear; SB scleral buckle; PPV pars plana vitrectomy; VH Vitreous hemorrhage; PRP panretinal laser photocoagulation; IVK intravitreal kenalog; VMT vitreomacular traction; MH Macular hole;  NVD neovascularization of the disc; NVE neovascularization elsewhere; AREDS age related eye disease study; ARMD age related macular degeneration; POAG primary open angle glaucoma; EBMD epithelial/anterior basement membrane dystrophy; ACIOL anterior chamber intraocular lens; IOL intraocular lens; PCIOL posterior chamber intraocular lens; Phaco/IOL phacoemulsification with intraocular lens placement; PRK photorefractive keratectomy; LASIK laser assisted in situ keratomileusis; HTN hypertension; DM diabetes mellitus; COPD chronic obstructive pulmonary disease

## 2021-09-07 ENCOUNTER — Encounter (INDEPENDENT_AMBULATORY_CARE_PROVIDER_SITE_OTHER): Payer: Self-pay | Admitting: Ophthalmology

## 2021-09-07 ENCOUNTER — Ambulatory Visit (INDEPENDENT_AMBULATORY_CARE_PROVIDER_SITE_OTHER): Payer: Medicare PPO | Admitting: Ophthalmology

## 2021-09-07 ENCOUNTER — Other Ambulatory Visit: Payer: Self-pay

## 2021-09-07 DIAGNOSIS — H353231 Exudative age-related macular degeneration, bilateral, with active choroidal neovascularization: Secondary | ICD-10-CM | POA: Diagnosis not present

## 2021-09-07 DIAGNOSIS — Z961 Presence of intraocular lens: Secondary | ICD-10-CM | POA: Diagnosis not present

## 2021-09-07 DIAGNOSIS — I10 Essential (primary) hypertension: Secondary | ICD-10-CM

## 2021-09-07 DIAGNOSIS — H35033 Hypertensive retinopathy, bilateral: Secondary | ICD-10-CM

## 2021-09-07 DIAGNOSIS — H3581 Retinal edema: Secondary | ICD-10-CM

## 2021-09-07 DIAGNOSIS — H26492 Other secondary cataract, left eye: Secondary | ICD-10-CM

## 2021-09-07 MED ORDER — AFLIBERCEPT 2MG/0.05ML IZ SOLN FOR KALEIDOSCOPE
2.0000 mg | INTRAVITREAL | Status: AC | PRN
  Administered 2021-09-07: 2 mg via INTRAVITREAL

## 2021-09-07 MED ORDER — AFLIBERCEPT 2MG/0.05ML IZ SOLN FOR KALEIDOSCOPE
2.0000 mg | INTRAVITREAL | Status: AC | PRN
Start: 2021-09-07 — End: 2021-09-07
  Administered 2021-09-07: 2 mg via INTRAVITREAL

## 2021-10-02 NOTE — Progress Notes (Shared)
[facility] - Clinic Note  10/05/2021     CHIEF COMPLAINT Patient presents for No chief complaint on file.    HISTORY OF PRESENT ILLNESS: Alexis Cortez is a 87 y.o. female who presents to the clinic today for:     Referring physician: April Manson, NP [phone],  [place] [address]  HISTORICAL INFORMATION:  Selected notes from the MEDICAL RECORD NUMBER Referred by [facility], [place], [place] for concern of exu ARMD LEE: 02.06.20 (Shil K. Patel) [BCVA: OD: 20/30+2 OS: 20/25 Ocular Hx-PCO OS, exu ARMD OD, non-exu ARMD OS, s/p YAG cap OD, s/p IVE OD 02.26.20  PMH-HTN, depression, anxiety   CURRENT MEDICATIONS: No current outpatient medications on file. (Ophthalmic Drugs)   No current facility-administered medications for this visit. (Ophthalmic Drugs)   Current Outpatient Medications (Other)  Medication Sig   albuterol (VENTOLIN HFA) 108 (90 Base) MCG/ACT inhaler Inhale 2 puffs into the lungs every 6 (six) hours as needed for wheezing or shortness of breath.   amLODipine (NORVASC) 5 MG tablet Take 1 tablet (5 mg total) by mouth daily.   benzonatate (TESSALON) 100 MG capsule Take 100 mg by mouth 3 (three) times daily.   busPIRone (BUSPAR) 7.5 MG tablet Take 7.5 mg by mouth 2 (two) times daily.    CVS PURELAX 17 GM/SCOOP powder Take 17 g by mouth daily as needed for moderate constipation.    docusate sodium (COLACE) 100 MG capsule Take 100 mg by mouth daily as needed for mild constipation.    esomeprazole (NEXIUM) 40 MG capsule Take by mouth.   levothyroxine (SYNTHROID) 75 MCG tablet Take 75 mcg by mouth daily.   lisinopril (ZESTRIL) 10 MG tablet Take 10 mg by mouth daily.    mirtazapine (REMERON) 15 MG tablet Take 15 mg by mouth at bedtime.    Misc. Devices MISC Knee high compression stockings 18-23mmHg pressure to be worn during daytime hours and off at night Dx: peripheral edema   Multiple Vitamin tablet Take 1 tablet by mouth  daily.    pregabalin (LYRICA) 50 MG capsule Take 50 mg by mouth daily.    traZODone (DESYREL) 100 MG tablet Take 100 mg by mouth at bedtime.    No current facility-administered medications for this visit. (Other)   REVIEW OF SYSTEMS:   ALLERGIES No Known Allergies  PAST MEDICAL HISTORY Past Medical History:  Diagnosis Date   Hypertension    Hypertensive retinopathy    OU   Macular degeneration    Wet OD, Dry OS   Past Surgical History:  Procedure Laterality Date   CATARACT EXTRACTION Bilateral    EYE SURGERY     YAG LASER APPLICATION Right    FAMILY HISTORY No family history on file.  SOCIAL HISTORY Social History   Tobacco Use   Smoking status: Never   Smokeless tobacco: Never  Vaping Use   Vaping Use: Never used  Substance Use Topics   Alcohol use: Yes    Alcohol/week: 2.0 standard drinks    Types: 2 Standard drinks or equivalent per week   Drug use: Never       OPHTHALMIC EXAM:  Not recorded    IMAGING AND PROCEDURES  Imaging and Procedures for @TODAY @          ASSESSMENT/PLAN: No diagnosis found.  1. Exudative age related macular degeneration, both eyes  - interval conversion to exu ARMD OS on exam and OCT on 09.12.22  - previous  pt of Dr. Caroline More at [facility] in [place], [place] (last injection was IVE on 02.26.20, and was on a 7-8 injection interval) -- pt living in [place] temporarily with family  - S/P IVE #1 OD (07.08.20), #2 (08.19.20), # 3 (10.14.20), #4 (02.15.21), #4 (04.28.21), #5 (09.17.21), #6 (09.12.22), #7 (10.10.22), # 8 (11.21.22)             - S/P IVE OS #1 (09.12.22), #2 (10.10.22), #3 (11.21.22)  - OCT shows stable improvement in IRF overlying central SRHM/CNV; OS: Stable improvement in central SRHM and SRF  - BCVA 20/40 OD, 20/25 OS  - recommend IVE OU (OD #9 and OS #4) today, 12.19.22  - pt wishes to proceed  - RBA of procedure discussed, questions answered  - informed consent obtained  - Eylea informed  consent form signed and scanned on 09.12.22 (OU)  - Eylea4U benefits investigation started on April 25, 2019 -- approved for 2021  - see procedure note  - f/u in 4 wks -- DFE/OCT/possible injection  2,3. Hypertensive retinopathy OU  - discussed importance of tight BP control  - monitor  4,5. Pseudophakia OU  - s/p CE/IOL OU  - s/p YAG cap OD  - beautiful surgeries, doing well  - OS with mild noncentral PCO  - monitor  Ophthalmic Meds Ordered this visit:  No orders of the defined types were placed in this encounter.    No follow-ups on file.  There are no Patient Instructions on file for this visit.  This document serves as a record of services personally performed by Karie Chimera, MD, PhD. It was created on their behalf by Annalee Genta, COMT. The creation of this record is the provider's dictation and/or activities during the visit.  Electronically signed by: Annalee Genta, COMT 10/02/21 10:36 AM  Karie Chimera, M.D., Ph.D. Diseases & Surgery of the Retina and Vitreous [facility] 07/27/2021     Abbreviations: M myopia (nearsighted); A astigmatism; H hyperopia (farsighted); P presbyopia; Mrx spectacle prescription;  CTL contact lenses; OD right eye; OS left eye; OU both eyes  XT exotropia; ET esotropia; PEK punctate epithelial keratitis; PEE punctate epithelial erosions; DES dry eye syndrome; MGD meibomian gland dysfunction; ATs artificial tears; PFAT's preservative free artificial tears; NSC nuclear sclerotic cataract; PSC posterior subcapsular cataract; ERM epi-retinal membrane; PVD posterior vitreous detachment; RD retinal detachment; DM diabetes mellitus; DR diabetic retinopathy; NPDR non-proliferative diabetic retinopathy; PDR proliferative diabetic retinopathy; CSME clinically significant macular edema; DME diabetic macular edema; dbh dot blot hemorrhages; CWS cotton wool spot; POAG primary open angle glaucoma; C/D cup-to-disc ratio; HVF humphrey  visual field; GVF goldmann visual field; OCT optical coherence tomography; IOP intraocular pressure; BRVO Branch retinal vein occlusion; CRVO central retinal vein occlusion; CRAO central retinal artery occlusion; BRAO branch retinal artery occlusion; RT retinal tear; SB scleral buckle; PPV pars plana vitrectomy; VH Vitreous hemorrhage; PRP panretinal laser photocoagulation; IVK intravitreal kenalog; VMT vitreomacular traction; MH Macular hole;  NVD neovascularization of the disc; NVE neovascularization elsewhere; AREDS age related eye disease study; ARMD age related macular degeneration; POAG primary open angle glaucoma; EBMD epithelial/anterior basement membrane dystrophy; ACIOL anterior chamber intraocular lens; IOL intraocular lens; PCIOL posterior chamber intraocular lens; Phaco/IOL phacoemulsification with intraocular lens placement; PRK photorefractive keratectomy; LASIK laser assisted in situ keratomileusis; HTN hypertension; DM diabetes mellitus; COPD chronic obstructive pulmonary disease

## 2021-10-05 ENCOUNTER — Encounter (INDEPENDENT_AMBULATORY_CARE_PROVIDER_SITE_OTHER): Payer: Medicare PPO | Admitting: Ophthalmology

## 2021-10-05 DIAGNOSIS — H35033 Hypertensive retinopathy, bilateral: Secondary | ICD-10-CM

## 2021-10-05 DIAGNOSIS — I10 Essential (primary) hypertension: Secondary | ICD-10-CM

## 2021-10-05 DIAGNOSIS — H353231 Exudative age-related macular degeneration, bilateral, with active choroidal neovascularization: Secondary | ICD-10-CM

## 2021-10-05 DIAGNOSIS — Z961 Presence of intraocular lens: Secondary | ICD-10-CM

## 2021-10-05 DIAGNOSIS — H26492 Other secondary cataract, left eye: Secondary | ICD-10-CM

## 2021-11-26 NOTE — Progress Notes (Signed)
[facility] Note  11/30/2021     CHIEF COMPLAINT Patient presents for Retina Follow Up    HISTORY OF PRESENT ILLNESS: Alexis Cortez is a 88 y.o. female who presents to the clinic today for:   HPI     Retina Follow Up   Patient presents with  Wet AMD.  In both eyes.  Severity is moderate.  Duration of 12 weeks.  Since onset it is stable.  I, the attending physician,  performed the HPI with the patient and updated documentation appropriately.        Comments   Pt here for 12 wk ret eval for exu ARMD OU. Pt states vision is about the same, no changes. Pt delayed 8 wks for 4 wk ret f/u.       Last edited by Bernarda Caffey, MD on 11/30/2021 11:25 PM.     Referring physician: Jettie Booze, NP [address] [place],  [place] [address]  HISTORICAL INFORMATION:  Selected notes from the MEDICAL RECORD NUMBER Referred by [place], [place] for concern of exu ARMD LEE: 02.06.20 (Shil K. Patel) [BCVA: OD: 20/30+2 OS: 20/25 Ocular Hx-PCO OS, exu ARMD OD, non-exu ARMD OS, s/p YAG cap OD, s/p IVE OD 02.26.20  PMH-HTN, depression, anxiety   CURRENT MEDICATIONS: No current outpatient medications on file. (Ophthalmic Drugs)   No current facility-administered medications for this visit. (Ophthalmic Drugs)   Current Outpatient Medications (Other)  Medication Sig   albuterol (VENTOLIN HFA) 108 (90 Base) MCG/ACT inhaler Inhale 2 puffs into the lungs every 6 (six) hours as needed for wheezing or shortness of breath.   amLODipine (NORVASC) 5 MG tablet Take 1 tablet (5 mg total) by mouth daily.   benzonatate (TESSALON) 100 MG capsule Take 100 mg by mouth 3 (three) times daily.   busPIRone (BUSPAR) 7.5 MG tablet Take 7.5 mg by mouth 2 (two) times daily.    [employer] PURELAX 17 GM/SCOOP powder Take 17 g by mouth daily as needed for moderate constipation.    docusate sodium (COLACE) 100 MG capsule Take 100 mg by mouth daily as needed for mild  constipation.    esomeprazole (NEXIUM) 40 MG capsule Take by mouth.   levothyroxine (SYNTHROID) 75 MCG tablet Take 75 mcg by mouth daily.   lisinopril (ZESTRIL) 10 MG tablet Take 10 mg by mouth daily.    mirtazapine (REMERON) 15 MG tablet Take 15 mg by mouth at bedtime.    Misc. Devices MISC Knee high compression stockings 18-53mmHg pressure to be worn during daytime hours and off at night Dx: peripheral edema   Multiple Vitamin tablet Take 1 tablet by mouth daily.    pregabalin (LYRICA) 50 MG capsule Take 50 mg by mouth daily.    traZODone (DESYREL) 100 MG tablet Take 100 mg by mouth at bedtime.    No current facility-administered medications for this visit. (Other)   REVIEW OF SYSTEMS: ROS   Positive for: Eyes Negative for: Constitutional, Gastrointestinal, Neurological, Skin, Genitourinary, Musculoskeletal, HENT, Endocrine, Cardiovascular, Respiratory, Psychiatric, Allergic/Imm, Heme/Lymph Last edited by Kingsley Spittle, COT on 11/30/2021  1:20 PM.      ALLERGIES No Known Allergies  PAST MEDICAL HISTORY Past Medical History:  Diagnosis Date   Hypertension    Hypertensive retinopathy    OU   Macular degeneration    Wet OD, Dry OS   Past Surgical History:  Procedure Laterality Date   CATARACT EXTRACTION Bilateral  EYE SURGERY     YAG LASER APPLICATION Right    FAMILY HISTORY History reviewed. No pertinent family history.  SOCIAL HISTORY Social History   Tobacco Use   Smoking status: Never   Smokeless tobacco: Never  Vaping Use   Vaping Use: Never used  Substance Use Topics   Alcohol use: Yes    Alcohol/week: 2.0 standard drinks    Types: 2 Standard drinks or equivalent per week   Drug use: Never       OPHTHALMIC EXAM:  Base Eye Exam     Visual Acuity (Snellen - Linear)       Right Left   Dist [place] 20/40 -1 20/40 -2   Dist ph [place] NI NI         Tonometry (Tonopen, 1:29 PM)       Right Left   Pressure 14 12         Pupils       Dark  Light Shape React APD   Right 3 2 Round Brisk None   Left 3 2 Round Brisk None         Visual Fields (Counting fingers)       Left Right    Full Full         Extraocular Movement       Right Left    Full, Ortho Full, Ortho         Neuro/Psych     Oriented x3: Yes   Mood/Affect: Normal         Dilation     Both eyes: 1.0% Mydriacyl, 2.5% Phenylephrine @ 1:30 PM           Slit Lamp and Fundus Exam     Slit Lamp Exam       Right Left   Lids/Lashes Dermatochalasis - upper lid, mild Ptosis, mild Meibomian gland dysfunction Dermatochalasis - upper lid, mild Ptosis, mild Meibomian gland dysfunction   Conjunctiva/Sclera White and quiet mild temporal Pinguecula   Cornea Mild Arcus, trace Punctate epithelial erosions, tear film debris Mild Arcus, trace Punctate epithelial erosions   Anterior Chamber Deep and quiet Deep and quiet   Iris Round and dilated Round and dilated   Lens Posterior chamber intraocular lens Posterior chamber intraocular lens, 1+Posterior capsular opacification superiorly   Anterior Vitreous Vitreous syneresis, Posterior vitreous detachment Vitreous syneresis         Fundus Exam       Right Left   Disc Mild Pallor, Sharp rim, Peripapillary atrophy Mild Pallor, Sharp rim, Peripapillary atrophy   C/D Ratio 0.5 0.5   Macula Blunted foveal reflex, central PED/CNVM with early fibrosis, Drusen, RPE mottling, clumping and atrophy, cystic changes--slightly increased Blunted foveal reflex, CNV with central edema / SRF -- slightly increased, Drusen, RPE mottling and clumping, early atrophy, no heme   Vessels Vascular attenuation Vascular attenuation, Tortuous   Periphery Attached, No heme  Attached, No heme            Refraction     Manifest Refraction       Sphere Cylinder Axis Dist VA   Right       Left -0.75 +0.25 055 20/40-2           IMAGING AND PROCEDURES  Imaging and Procedures for $RemoveBefore'@TODAY'[email or web address]$ @  OCT, Retina - OU - Both Eyes        Right Eye Quality was good. Central Foveal Thickness: 394. Progression has worsened. Findings include abnormal foveal contour, outer retinal tubulation, intraretinal  fluid, subretinal hyper-reflective material, pigment epithelial detachment, outer retinal atrophy, no SRF, retinal drusen (Mild interval increase in IRF overlying central SRHM/CNV).   Left Eye Quality was good. Central Foveal Thickness: 303. Progression has worsened. Findings include no IRF, retinal drusen , outer retinal atrophy, abnormal foveal contour, pigment epithelial detachment, subretinal hyper-reflective material, intraretinal hyper-reflective material, choroidal neovascular membrane, no SRF, subretinal fluid (Interval increase in [facility] and SRF overlying central PED).   Notes *Images captured and stored on drive  Diagnosis / Impression:  OD: exu ARMD - Mild interval increase in IRF overlying central SRHM/CNV OS: exu ARMD -- Interval increase in [facility] and SRF overlying central PED  Clinical management:  See below  Abbreviations: NFP - Normal foveal profile. CME - cystoid macular edema. PED - pigment epithelial detachment. IRF - intraretinal fluid. SRF - subretinal fluid. EZ - ellipsoid zone. ERM - epiretinal membrane. ORA - outer retinal atrophy. ORT - outer retinal tubulation. SRHM - subretinal hyper-reflective material      Intravitreal Injection, Pharmacologic Agent - OD - Right Eye       Time Out 11/30/2021. 2:21 PM. Confirmed correct patient, procedure, site, and patient consented.   Anesthesia Topical anesthesia was used. Anesthetic medications included Lidocaine 2%, Proparacaine 0.5%.   Procedure Preparation included 5% betadine to ocular surface, eyelid speculum. A (32g) needle was used.   Injection: 2 mg aflibercept 2 MG/0.05ML   Route: Intravitreal, Site: Right Eye   NDC: M7179715, Lot: [id number], Expiration date: 10/17/2022, Waste: 0.05 mL   Post-op Post injection exam found visual  acuity of at least counting fingers. The patient tolerated the procedure well. There were no complications. The patient received written and verbal post procedure care education. Post injection medications were not given.      Intravitreal Injection, Pharmacologic Agent - OS - Left Eye       Time Out 11/30/2021. 2:21 PM. Confirmed correct patient, procedure, site, and patient consented.   Anesthesia Topical anesthesia was used. Anesthetic medications included Lidocaine 2%, Proparacaine 0.5%.   Procedure Preparation included 5% betadine to ocular surface, eyelid speculum. A (32g) needle was used.   Injection: 2 mg aflibercept 2 MG/0.05ML   Route: Intravitreal, Site: Left Eye   NDC: M7179715, Lot: [id number], Expiration date: 02/14/2022, Waste: 0.05 mL   Post-op Post injection exam found visual acuity of at least counting fingers. The patient tolerated the procedure well. There were no complications. The patient received written and verbal post procedure care education. Post injection medications were not given.            ASSESSMENT/PLAN:   ICD-10-CM   1. Exudative age-related macular degeneration of both eyes with active choroidal neovascularization (HCC)  H35.3231 OCT, Retina - OU - Both Eyes    Intravitreal Injection, Pharmacologic Agent - OD - Right Eye    Intravitreal Injection, Pharmacologic Agent - OS - Left Eye    aflibercept (EYLEA) SOLN 2 mg    aflibercept (EYLEA) SOLN 2 mg    2. Essential hypertension  I10     3. Hypertensive retinopathy of both eyes  H35.033     4. Pseudophakia of both eyes  Z96.1     5. Left posterior capsular opacification  H26.492       1. Exudative age related macular degeneration, both eyes  - delayed f/u -- 12 wks instead of 4  - interval conversion to exu ARMD OS on exam and OCT on 09.12.22  - previous pt of Dr. Georges Lynch at [facility] in [place], [place] (last injection was IVE on 02.26.20, and was on a 7-8 injection  interval) -- pt living in [place] temporarily with family  - S/P IVE #1 OD (07.08.20), #2 (08.19.20), # 3 (10.14.20), #4 (02.15.21), #4 (04.28.21), #5 (09.17.21), #6 (09.12.22), #7 (10.10.22), #8 (11.21.22)             - S/P IVE OS #1 (09.12.22), #2 (10.10.22), #3 (11.21.23)  - OCT shows OD: Mild interval increase in IRF overlying central SRHM/CNV; OS: Interval increase in [facility] and SRF overlying central PED  - BCVA 20/40 OU - OS decreased  - recommend IVE OU (OD #9 and OS #4) today, 02.13.23  - pt wishes to proceed  - RBA of procedure discussed, questions answered  - informed consent obtained  - Eylea informed consent form signed and scanned on 09.12.22 (OU)  - Eylea4U benefits investigation started on April 25, 2019 -- approved for 2021  - see procedure note  - f/u in 4-6 wks -- DFE/OCT/possible injection  2,3. Hypertensive retinopathy OU  - discussed importance of tight BP control  - monitor  4,5. Pseudophakia OU  - s/p CE/IOL OU  - s/p YAG cap OD  - beautiful surgeries, doing well  - OS with mild noncentral PCO  - monitor  Ophthalmic Meds Ordered this visit:  Meds ordered this encounter  Medications   aflibercept (EYLEA) SOLN 2 mg   aflibercept (EYLEA) SOLN 2 mg     Return for f/u 4-6 weeks, exu ARMD OU, DFE, OCT.  There are no Patient Instructions on file for this visit.  This document serves as a record of services personally performed by Gardiner Sleeper, MD, PhD. It was created on their behalf by Roselee Nova, COMT. The creation of this record is the provider's dictation and/or activities during the visit.  Electronically signed by: Roselee Nova, COMT 11/30/21 11:30 PM  Gardiner Sleeper, M.D., Ph.D. Diseases & Surgery of the Retina and [place]  I have reviewed the above documentation for accuracy and completeness, and I agree with the above. Gardiner Sleeper, M.D., Ph.D. 11/30/21 11:30 PM   Abbreviations: M myopia (nearsighted);  A astigmatism; H hyperopia (farsighted); P presbyopia; Mrx spectacle prescription;  CTL contact lenses; OD right eye; OS left eye; OU both eyes  XT exotropia; ET esotropia; PEK punctate epithelial keratitis; PEE punctate epithelial erosions; DES dry eye syndrome; MGD meibomian gland dysfunction; ATs artificial tears; PFAT's preservative free artificial tears; [place] nuclear sclerotic cataract; PSC posterior subcapsular cataract; ERM epi-retinal membrane; PVD posterior vitreous detachment; RD retinal detachment; DM diabetes mellitus; DR diabetic retinopathy; NPDR non-proliferative diabetic retinopathy; PDR proliferative diabetic retinopathy; CSME clinically significant macular edema; DME diabetic macular edema; dbh dot blot hemorrhages; CWS cotton wool spot; POAG primary open angle glaucoma; C/D cup-to-disc ratio; HVF humphrey visual field; GVF goldmann visual field; OCT optical coherence tomography; IOP intraocular pressure; BRVO Branch retinal vein occlusion; CRVO central retinal vein occlusion; CRAO central retinal artery occlusion; BRAO branch retinal artery occlusion; RT retinal tear; SB scleral buckle; PPV pars plana vitrectomy; VH Vitreous hemorrhage; PRP panretinal laser photocoagulation; IVK intravitreal kenalog; VMT vitreomacular traction; MH Macular hole;  NVD neovascularization of the disc; NVE neovascularization elsewhere; AREDS age related eye disease study; ARMD age related macular degeneration; POAG primary open angle glaucoma; EBMD epithelial/anterior basement membrane dystrophy; ACIOL anterior chamber intraocular lens; IOL intraocular lens; PCIOL posterior chamber intraocular lens; Phaco/IOL phacoemulsification with intraocular lens placement; PRK photorefractive keratectomy; LASIK  laser assisted in situ keratomileusis; HTN hypertension; DM diabetes mellitus; COPD chronic obstructive pulmonary disease

## 2021-11-30 ENCOUNTER — Other Ambulatory Visit: Payer: Self-pay

## 2021-11-30 ENCOUNTER — Encounter (INDEPENDENT_AMBULATORY_CARE_PROVIDER_SITE_OTHER): Payer: Self-pay | Admitting: Ophthalmology

## 2021-11-30 ENCOUNTER — Ambulatory Visit (INDEPENDENT_AMBULATORY_CARE_PROVIDER_SITE_OTHER): Payer: Medicare PPO | Admitting: Ophthalmology

## 2021-11-30 DIAGNOSIS — H353231 Exudative age-related macular degeneration, bilateral, with active choroidal neovascularization: Secondary | ICD-10-CM | POA: Diagnosis not present

## 2021-11-30 DIAGNOSIS — Z961 Presence of intraocular lens: Secondary | ICD-10-CM | POA: Diagnosis not present

## 2021-11-30 DIAGNOSIS — I10 Essential (primary) hypertension: Secondary | ICD-10-CM

## 2021-11-30 DIAGNOSIS — H35033 Hypertensive retinopathy, bilateral: Secondary | ICD-10-CM

## 2021-11-30 DIAGNOSIS — H26492 Other secondary cataract, left eye: Secondary | ICD-10-CM

## 2021-11-30 MED ORDER — AFLIBERCEPT 2MG/0.05ML IZ SOLN FOR KALEIDOSCOPE
2.0000 mg | INTRAVITREAL | Status: AC | PRN
  Administered 2021-11-30: 2 mg via INTRAVITREAL

## 2022-01-11 NOTE — Progress Notes (Signed)
?[facility] Note ? ?01/12/2022 ? ?  ? ?CHIEF COMPLAINT ?Patient presents for Retina Follow Up ? ?HISTORY OF PRESENT ILLNESS: ?Alexis Cortez is a 88 y.o. female who presents to the clinic today for:  ? ?HPI   ? ? Retina Follow Up   ?Patient presents with  Wet AMD.  In both eyes.  This started 6 weeks ago.  I, the attending physician,  performed the HPI with the patient and updated documentation appropriately. ? ?  ?  ? ? Comments   ?Patient here for 6 weeks retina follow up for exu ARMD OU. Patient states vision doing fine. No eye pain. Weak today.  ? ?  ?  ?Last edited by Rennis Chris, MD on 01/13/2022  1:24 AM.  ?  ? ? ?Referring physician: ?April Manson, NP ?[phone] ?[place],  [place] [address] ? ?HISTORICAL INFORMATION:  ?Selected notes from the MEDICAL RECORD NUMBER ?Referred by [facility], [place], [place] for concern of exu ARMD ?LEE: 02.06.20 (Shil K. Patel) [BCVA: OD: 20/30+2 OS: 20/25 ?Ocular Hx-PCO OS, exu ARMD OD, non-exu ARMD OS, s/p YAG cap OD, s/p IVE OD 02.26.20  ?PMH-HTN, depression, anxiety  ? ?CURRENT MEDICATIONS: ?No current outpatient medications on file. (Ophthalmic Drugs)  ? ?No current facility-administered medications for this visit. (Ophthalmic Drugs)  ? ?Current Outpatient Medications (Other)  ?Medication Sig  ? albuterol (VENTOLIN HFA) 108 (90 Base) MCG/ACT inhaler Inhale 2 puffs into the lungs every 6 (six) hours as needed for wheezing or shortness of breath.  ? amLODipine (NORVASC) 5 MG tablet Take 1 tablet (5 mg total) by mouth daily.  ? benzonatate (TESSALON) 100 MG capsule Take 100 mg by mouth 3 (three) times daily.  ? busPIRone (BUSPAR) 7.5 MG tablet Take 7.5 mg by mouth 2 (two) times daily.   ? [employer]S PURELAX 17 GM/SCOOP powder Take 17 g by mouth daily as needed for moderate constipation.   ? docusate sodium (COLACE) 100 MG capsule Take 100 mg by mouth daily as needed for mild constipation.   ? esomeprazole (NEXIUM) 40 MG capsule Take by  mouth.  ? levothyroxine (SYNTHROID) 75 MCG tablet Take 75 mcg by mouth daily.  ? lisinopril (ZESTRIL) 10 MG tablet Take 10 mg by mouth daily.   ? mirtazapine (REMERON) 15 MG tablet Take 15 mg by mouth at bedtime.   ? Misc. Devices MISC Knee high compression stockings 18-63mmHg pressure to be worn during daytime hours and off at night Dx: peripheral edema  ? Multiple Vitamin tablet Take 1 tablet by mouth daily.   ? pregabalin (LYRICA) 50 MG capsule Take 50 mg by mouth daily.   ? traZODone (DESYREL) 100 MG tablet Take 100 mg by mouth at bedtime.   ? ?No current facility-administered medications for this visit. (Other)  ? ?REVIEW OF SYSTEMS: ?ROS   ?Positive for: Eyes ?Negative for: Constitutional, Gastrointestinal, Neurological, Skin, Genitourinary, Musculoskeletal, HENT, Endocrine, Cardiovascular, Respiratory, Psychiatric, Allergic/Imm, Heme/Lymph ?Last edited by Laddie Aquas, COA on 01/12/2022  2:15 PM.  ?  ? ?ALLERGIES ?No Known Allergies ? ?PAST MEDICAL HISTORY ?Past Medical History:  ?Diagnosis Date  ? Hypertension   ? Hypertensive retinopathy   ? OU  ? Macular degeneration   ? Wet OD, Dry OS  ? ?Past Surgical History:  ?Procedure Laterality Date  ? CATARACT EXTRACTION Bilateral   ? EYE SURGERY    ? YAG LASER APPLICATION Right   ? ?FAMILY HISTORY ?History reviewed.  No pertinent family history. ? ?SOCIAL HISTORY ?Social History  ? ?Tobacco Use  ? Smoking status: Never  ? Smokeless tobacco: Never  ?Vaping Use  ? Vaping Use: Never used  ?Substance Use Topics  ? Alcohol use: Yes  ?  Alcohol/week: 2.0 standard drinks  ?  Types: 2 Standard drinks or equivalent per week  ? Drug use: Never  ?  ? ?  ?OPHTHALMIC EXAM: ? ?Base Eye Exam   ? ? Visual Acuity (Snellen - Linear)   ? ?   Right Left  ? Dist [place] 20/50 +1 20/40 -2  ? Dist ph [place] 20/40 -2 NI  ? ?  ?  ? ? Tonometry (Tonopen, 2:12 PM)   ? ?   Right Left  ? Pressure 10 10  ? ?  ?  ? ? Pupils   ? ?   Dark Light Shape React APD  ? Right 3 2 Round Brisk None  ? Left 3 2  Round Brisk None  ? ?  ?  ? ? Visual Fields (Counting fingers)   ? ?   Left Right  ?  Full Full  ? ?  ?  ? ? Extraocular Movement   ? ?   Right Left  ?  Full, Ortho Full, Ortho  ? ?  ?  ? ? Neuro/Psych   ? ? Oriented x3: Yes  ? Mood/Affect: Normal  ? ?  ?  ? ? Dilation   ? ? Both eyes: 1.0% Mydriacyl, 2.5% Phenylephrine @ 2:12 PM  ? ?  ?  ? ?  ? ?Slit Lamp and Fundus Exam   ? ? Slit Lamp Exam   ? ?   Right Left  ? Lids/Lashes Dermatochalasis - upper lid, mild Ptosis, mild Meibomian gland dysfunction Dermatochalasis - upper lid, mild Ptosis, mild Meibomian gland dysfunction  ? Conjunctiva/Sclera White and quiet mild temporal Pinguecula  ? Cornea Mild Arcus, trace Punctate epithelial erosions, tear film debris Mild Arcus, trace Punctate epithelial erosions  ? Anterior Chamber Deep and quiet Deep and quiet  ? Iris Round and dilated Round and dilated  ? Lens Posterior chamber intraocular lens Posterior chamber intraocular lens, 1+Posterior capsular opacification superiorly  ? Anterior Vitreous Vitreous syneresis, Posterior vitreous detachment Vitreous syneresis  ? ?  ?  ? ? Fundus Exam   ? ?   Right Left  ? Disc Mild Pallor, Sharp rim, Peripapillary atrophy Mild Pallor, Sharp rim, Peripapillary atrophy  ? C/D Ratio 0.5 0.5  ? Macula Blunted foveal reflex, central PED/CNVM with early fibrosis, Drusen, RPE mottling, clumping and atrophy, cystic changes--improved Blunted foveal reflex, CNV with central edema / SRF -- improved, Drusen, RPE mottling and clumping, early atrophy, no heme  ? Vessels Vascular attenuation Vascular attenuation, Tortuous  ? Periphery Attached, No heme  Attached, No heme   ? ?  ?  ? ?  ? ?IMAGING AND PROCEDURES  ?Imaging and Procedures for @TODAY @ ? ?OCT, Retina - OU - Both Eyes   ? ?   ?Right Eye ?Quality was good. Central Foveal Thickness: 380. Progression has improved. Findings include abnormal foveal contour, outer retinal tubulation, intraretinal fluid, subretinal hyper-reflective material,  pigment epithelial detachment, outer retinal atrophy, no SRF, retinal drusen (Interval improvement in IRF overlying central SRHM/CNV).  ? ?Left Eye ?Quality was good. Central Foveal Thickness: 254. Progression has improved. Findings include no IRF, retinal drusen , outer retinal atrophy, abnormal foveal contour, pigment epithelial detachment, subretinal hyper-reflective material, intraretinal hyper-reflective material, choroidal neovascular membrane,  no SRF, subretinal fluid ([facility] and SRF overlying central PED).  ? ?Notes ?*Images captured and stored on drive ? ?Diagnosis / Impression:  ?OD: exu ARMD - Interval improvement in IRF overlying central SRHM/CNV ?OS: exu ARMD - [facility] and SRF overlying central PED ? ?Clinical management:  ?See below ? ?Abbreviations: NFP - Normal foveal profile. CME - cystoid macular edema. PED - pigment epithelial detachment. IRF - intraretinal fluid. SRF - subretinal fluid. EZ - ellipsoid zone. ERM - epiretinal membrane. ORA - outer retinal atrophy. ORT - outer retinal tubulation. SRHM - subretinal hyper-reflective material ? ? ?  ? ?Intravitreal Injection, Pharmacologic Agent - OD - Right Eye   ? ?   ?Time Out ?01/12/2022. 2:51 PM. Confirmed correct patient, procedure, site, and patient consented.  ? ?Anesthesia ?Topical anesthesia was used. Anesthetic medications included Lidocaine 2%, Proparacaine 0.5%.  ? ?Procedure ?Preparation included 5% betadine to ocular surface, eyelid speculum. A (32g) needle was used.  ? ?Injection: ?2 mg aflibercept 2 MG/0.05ML ?  Route: Intravitreal, Site: Right Eye ?  NDC: [id number], Lot: [id number], Expiration date: 11/17/2022, Waste: 0.05 mL  ? ?Post-op ?Post injection exam found visual acuity of at least counting fingers. The patient tolerated the procedure well. There were no complications. The patient received written and verbal post procedure care education. Post injection medications were not given.   ? ?  ? ?Intravitreal Injection, Pharmacologic Agent - OS - Left Eye   ? ?   ?Time Out ?01/12/2022. 2:51 PM. Confirmed correct patient, procedure, site, and patient consented.  ? ?Anesthesia ?Topical anesthes

## 2022-01-12 ENCOUNTER — Ambulatory Visit (INDEPENDENT_AMBULATORY_CARE_PROVIDER_SITE_OTHER): Payer: Medicare PPO | Admitting: Ophthalmology

## 2022-01-12 ENCOUNTER — Other Ambulatory Visit: Payer: Self-pay

## 2022-01-12 ENCOUNTER — Encounter (INDEPENDENT_AMBULATORY_CARE_PROVIDER_SITE_OTHER): Payer: Self-pay | Admitting: Ophthalmology

## 2022-01-12 DIAGNOSIS — H35033 Hypertensive retinopathy, bilateral: Secondary | ICD-10-CM

## 2022-01-12 DIAGNOSIS — H26492 Other secondary cataract, left eye: Secondary | ICD-10-CM

## 2022-01-12 DIAGNOSIS — I10 Essential (primary) hypertension: Secondary | ICD-10-CM | POA: Diagnosis not present

## 2022-01-12 DIAGNOSIS — H353231 Exudative age-related macular degeneration, bilateral, with active choroidal neovascularization: Secondary | ICD-10-CM

## 2022-01-12 DIAGNOSIS — Z961 Presence of intraocular lens: Secondary | ICD-10-CM

## 2022-01-13 ENCOUNTER — Encounter (INDEPENDENT_AMBULATORY_CARE_PROVIDER_SITE_OTHER): Payer: Self-pay | Admitting: Ophthalmology

## 2022-01-13 MED ORDER — AFLIBERCEPT 2MG/0.05ML IZ SOLN FOR KALEIDOSCOPE
2.0000 mg | INTRAVITREAL | Status: AC | PRN
  Administered 2022-01-13: 2 mg via INTRAVITREAL

## 2022-01-15 ENCOUNTER — Other Ambulatory Visit: Payer: Self-pay

## 2022-01-15 ENCOUNTER — Inpatient Hospital Stay (HOSPITAL_COMMUNITY)
Admission: EM | Admit: 2022-01-15 | Discharge: 2022-01-29 | DRG: 329 | Disposition: A | Discharge status: Hospice | Payer: Medicare PPO | Attending: Surgery | Admitting: Surgery

## 2022-01-15 ENCOUNTER — Emergency Department (HOSPITAL_COMMUNITY): Payer: Medicare PPO

## 2022-01-15 ENCOUNTER — Encounter (HOSPITAL_COMMUNITY): Payer: Self-pay

## 2022-01-15 DIAGNOSIS — R6521 Severe sepsis with septic shock: Secondary | ICD-10-CM | POA: Diagnosis present

## 2022-01-15 DIAGNOSIS — Y929 Unspecified place or not applicable: Secondary | ICD-10-CM | POA: Diagnosis not present

## 2022-01-15 DIAGNOSIS — N179 Acute kidney failure, unspecified: Secondary | ICD-10-CM | POA: Diagnosis not present

## 2022-01-15 DIAGNOSIS — Z7189 Other specified counseling: Secondary | ICD-10-CM | POA: Diagnosis not present

## 2022-01-15 DIAGNOSIS — K921 Melena: Secondary | ICD-10-CM | POA: Diagnosis not present

## 2022-01-15 DIAGNOSIS — T8131XA Disruption of external operation (surgical) wound, not elsewhere classified, initial encounter: Secondary | ICD-10-CM | POA: Diagnosis present

## 2022-01-15 DIAGNOSIS — K275 Chronic or unspecified peptic ulcer, site unspecified, with perforation: Secondary | ICD-10-CM | POA: Diagnosis present

## 2022-01-15 DIAGNOSIS — E876 Hypokalemia: Secondary | ICD-10-CM | POA: Diagnosis present

## 2022-01-15 DIAGNOSIS — I10 Essential (primary) hypertension: Secondary | ICD-10-CM | POA: Diagnosis not present

## 2022-01-15 DIAGNOSIS — Z79890 Hormone replacement therapy (postmenopausal): Secondary | ICD-10-CM

## 2022-01-15 DIAGNOSIS — E039 Hypothyroidism, unspecified: Secondary | ICD-10-CM | POA: Diagnosis present

## 2022-01-15 DIAGNOSIS — Z515 Encounter for palliative care: Secondary | ICD-10-CM | POA: Diagnosis not present

## 2022-01-15 DIAGNOSIS — K659 Peritonitis, unspecified: Secondary | ICD-10-CM | POA: Diagnosis not present

## 2022-01-15 DIAGNOSIS — Z20822 Contact with and (suspected) exposure to covid-19: Secondary | ICD-10-CM | POA: Diagnosis present

## 2022-01-15 DIAGNOSIS — A419 Sepsis, unspecified organism: Secondary | ICD-10-CM | POA: Diagnosis not present

## 2022-01-15 DIAGNOSIS — R636 Underweight: Secondary | ICD-10-CM | POA: Diagnosis present

## 2022-01-15 DIAGNOSIS — K649 Unspecified hemorrhoids: Secondary | ICD-10-CM | POA: Diagnosis present

## 2022-01-15 DIAGNOSIS — K668 Other specified disorders of peritoneum: Secondary | ICD-10-CM | POA: Diagnosis present

## 2022-01-15 DIAGNOSIS — I50810 Right heart failure, unspecified: Secondary | ICD-10-CM | POA: Diagnosis not present

## 2022-01-15 DIAGNOSIS — R4189 Other symptoms and signs involving cognitive functions and awareness: Secondary | ICD-10-CM | POA: Diagnosis not present

## 2022-01-15 DIAGNOSIS — Z9841 Cataract extraction status, right eye: Secondary | ICD-10-CM

## 2022-01-15 DIAGNOSIS — E871 Hypo-osmolality and hyponatremia: Secondary | ICD-10-CM | POA: Diagnosis present

## 2022-01-15 DIAGNOSIS — R627 Adult failure to thrive: Secondary | ICD-10-CM | POA: Diagnosis present

## 2022-01-15 DIAGNOSIS — Y839 Surgical procedure, unspecified as the cause of abnormal reaction of the patient, or of later complication, without mention of misadventure at the time of the procedure: Secondary | ICD-10-CM | POA: Diagnosis present

## 2022-01-15 DIAGNOSIS — Z66 Do not resuscitate: Secondary | ICD-10-CM | POA: Diagnosis present

## 2022-01-15 DIAGNOSIS — D696 Thrombocytopenia, unspecified: Secondary | ICD-10-CM | POA: Diagnosis not present

## 2022-01-15 DIAGNOSIS — Z6827 Body mass index (BMI) 27.0-27.9, adult: Secondary | ICD-10-CM

## 2022-01-15 DIAGNOSIS — R7881 Bacteremia: Secondary | ICD-10-CM | POA: Diagnosis not present

## 2022-01-15 DIAGNOSIS — K631 Perforation of intestine (nontraumatic): Secondary | ICD-10-CM | POA: Diagnosis not present

## 2022-01-15 DIAGNOSIS — M7989 Other specified soft tissue disorders: Secondary | ICD-10-CM | POA: Diagnosis not present

## 2022-01-15 DIAGNOSIS — I110 Hypertensive heart disease with heart failure: Secondary | ICD-10-CM | POA: Diagnosis present

## 2022-01-15 DIAGNOSIS — F0393 Unspecified dementia, unspecified severity, with mood disturbance: Secondary | ICD-10-CM | POA: Diagnosis present

## 2022-01-15 DIAGNOSIS — K5909 Other constipation: Secondary | ICD-10-CM | POA: Diagnosis present

## 2022-01-15 DIAGNOSIS — D6959 Other secondary thrombocytopenia: Secondary | ICD-10-CM | POA: Diagnosis not present

## 2022-01-15 DIAGNOSIS — J95821 Acute postprocedural respiratory failure: Secondary | ICD-10-CM | POA: Diagnosis not present

## 2022-01-15 DIAGNOSIS — R931 Abnormal findings on diagnostic imaging of heart and coronary circulation: Secondary | ICD-10-CM | POA: Diagnosis not present

## 2022-01-15 DIAGNOSIS — K650 Generalized (acute) peritonitis: Secondary | ICD-10-CM | POA: Diagnosis present

## 2022-01-15 DIAGNOSIS — R5381 Other malaise: Secondary | ICD-10-CM

## 2022-01-15 DIAGNOSIS — R296 Repeated falls: Secondary | ICD-10-CM | POA: Diagnosis present

## 2022-01-15 DIAGNOSIS — D649 Anemia, unspecified: Secondary | ICD-10-CM | POA: Diagnosis present

## 2022-01-15 DIAGNOSIS — J9600 Acute respiratory failure, unspecified whether with hypoxia or hypercapnia: Secondary | ICD-10-CM | POA: Diagnosis not present

## 2022-01-15 DIAGNOSIS — E860 Dehydration: Secondary | ICD-10-CM | POA: Diagnosis present

## 2022-01-15 DIAGNOSIS — K658 Other peritonitis: Secondary | ICD-10-CM | POA: Diagnosis present

## 2022-01-15 DIAGNOSIS — N170 Acute kidney failure with tubular necrosis: Secondary | ICD-10-CM | POA: Diagnosis present

## 2022-01-15 DIAGNOSIS — E8809 Other disorders of plasma-protein metabolism, not elsewhere classified: Secondary | ICD-10-CM | POA: Diagnosis not present

## 2022-01-15 DIAGNOSIS — Z87891 Personal history of nicotine dependence: Secondary | ICD-10-CM

## 2022-01-15 DIAGNOSIS — I4891 Unspecified atrial fibrillation: Secondary | ICD-10-CM | POA: Diagnosis present

## 2022-01-15 DIAGNOSIS — I5082 Biventricular heart failure: Secondary | ICD-10-CM | POA: Diagnosis present

## 2022-01-15 DIAGNOSIS — K5720 Diverticulitis of large intestine with perforation and abscess without bleeding: Secondary | ICD-10-CM | POA: Diagnosis present

## 2022-01-15 DIAGNOSIS — H3530 Unspecified macular degeneration: Secondary | ICD-10-CM | POA: Diagnosis present

## 2022-01-15 DIAGNOSIS — F0394 Unspecified dementia, unspecified severity, with anxiety: Secondary | ICD-10-CM | POA: Diagnosis present

## 2022-01-15 DIAGNOSIS — Z79899 Other long term (current) drug therapy: Secondary | ICD-10-CM

## 2022-01-15 DIAGNOSIS — E162 Hypoglycemia, unspecified: Secondary | ICD-10-CM | POA: Diagnosis present

## 2022-01-15 DIAGNOSIS — J9601 Acute respiratory failure with hypoxia: Secondary | ICD-10-CM | POA: Diagnosis present

## 2022-01-15 DIAGNOSIS — Z9842 Cataract extraction status, left eye: Secondary | ICD-10-CM

## 2022-01-15 DIAGNOSIS — R1084 Generalized abdominal pain: Secondary | ICD-10-CM

## 2022-01-15 DIAGNOSIS — K5900 Constipation, unspecified: Secondary | ICD-10-CM

## 2022-01-15 LAB — COMPREHENSIVE METABOLIC PANEL
ALT: 13 U/L (ref 0–44)
AST: 28 U/L (ref 15–41)
Albumin: 3.4 g/dL — ABNORMAL LOW (ref 3.5–5.0)
Alkaline Phosphatase: 62 U/L (ref 38–126)
Anion gap: 16 — ABNORMAL HIGH (ref 5–15)
BUN: 33 mg/dL — ABNORMAL HIGH (ref 8–23)
CO2: 15 mmol/L — ABNORMAL LOW (ref 22–32)
Calcium: 8.6 mg/dL — ABNORMAL LOW (ref 8.9–10.3)
Chloride: 106 mmol/L (ref 98–111)
Creatinine, Ser: 1.77 mg/dL — ABNORMAL HIGH (ref 0.44–1.00)
GFR, Estimated: 27 mL/min — ABNORMAL LOW (ref >60)
Glucose, Bld: 177 mg/dL — ABNORMAL HIGH (ref 70–99)
Potassium: 4.3 mmol/L (ref 3.5–5.1)
Sodium: 137 mmol/L (ref 135–145)
Total Bilirubin: 1 mg/dL (ref 0.3–1.2)
Total Protein: 6.2 g/dL — ABNORMAL LOW (ref 6.5–8.1)

## 2022-01-15 LAB — CBC WITH DIFFERENTIAL/PLATELET
Abs Immature Granulocytes: 0.02 10*3/uL (ref 0.00–0.07)
Basophils Absolute: 0 10*3/uL (ref 0.0–0.1)
Basophils Relative: 0 %
Eosinophils Absolute: 0.1 10*3/uL (ref 0.0–0.5)
Eosinophils Relative: 1 %
HCT: 44.7 % (ref 36.0–46.0)
Hemoglobin: 14.2 g/dL (ref 12.0–15.0)
Immature Granulocytes: 0 %
Lymphocytes Relative: 16 %
Lymphs Abs: 1.3 10*3/uL (ref 0.7–4.0)
MCH: 30 pg (ref 26.0–34.0)
MCHC: 31.8 g/dL (ref 30.0–36.0)
MCV: 94.5 fL (ref 80.0–100.0)
Monocytes Absolute: 0.3 10*3/uL (ref 0.1–1.0)
Monocytes Relative: 4 %
Neutro Abs: 6.6 10*3/uL (ref 1.7–7.7)
Neutrophils Relative %: 79 %
Platelets: 172 10*3/uL (ref 150–400)
RBC: 4.73 MIL/uL (ref 3.87–5.11)
RDW: 13.4 % (ref 11.5–15.5)
WBC: 8.4 10*3/uL (ref 4.0–10.5)
nRBC: 0 % (ref 0.0–0.2)

## 2022-01-15 LAB — LACTIC ACID, PLASMA: Lactic Acid, Venous: 3.3 mmol/L (ref 0.5–1.9)

## 2022-01-15 LAB — LIPASE, BLOOD: Lipase: 37 U/L (ref 11–51)

## 2022-01-15 MED ORDER — HEPARIN SODIUM (PORCINE) 5000 UNIT/ML IJ SOLN
5000.0000 [IU] | Freq: Three times a day (TID) | INTRAMUSCULAR | Status: DC
  Administered 2022-01-15 – 2022-01-25 (×29): 5000 [IU] via SUBCUTANEOUS
  Filled 2022-01-15 (×30): qty 1

## 2022-01-15 MED ORDER — ONDANSETRON HCL 4 MG/2ML IJ SOLN
4.0000 mg | Freq: Four times a day (QID) | INTRAMUSCULAR | Status: DC | PRN
  Administered 2022-01-15: 4 mg via INTRAVENOUS
  Filled 2022-01-15: qty 2

## 2022-01-15 MED ORDER — MORPHINE SULFATE (PF) 2 MG/ML IV SOLN
2.0000 mg | Freq: Once | INTRAVENOUS | Status: AC
  Administered 2022-01-15: 2 mg via INTRAVENOUS
  Filled 2022-01-15: qty 1

## 2022-01-15 MED ORDER — SODIUM CHLORIDE 0.9 % IV SOLN: INTRAVENOUS | Status: DC

## 2022-01-15 MED ORDER — HYDROMORPHONE HCL 1 MG/ML IJ SOLN
0.5000 mg | INTRAMUSCULAR | Status: DC | PRN
  Administered 2022-01-15 – 2022-01-21 (×4): 0.5 mg via INTRAVENOUS
  Filled 2022-01-15: qty 0.5
  Filled 2022-01-15: qty 1
  Filled 2022-01-15 (×2): qty 0.5

## 2022-01-15 MED ORDER — ONDANSETRON 4 MG PO TBDP
4.0000 mg | ORAL_TABLET | Freq: Four times a day (QID) | ORAL | Status: DC | PRN
  Filled 2022-01-15: qty 1

## 2022-01-15 MED ORDER — PANTOPRAZOLE SODIUM 40 MG IV SOLR
40.0000 mg | Freq: Every day | INTRAVENOUS | Status: DC
  Administered 2022-01-15 – 2022-01-17 (×3): 40 mg via INTRAVENOUS
  Filled 2022-01-15 (×3): qty 10

## 2022-01-15 MED ORDER — ACETAMINOPHEN 325 MG PO TABS
650.0000 mg | ORAL_TABLET | Freq: Four times a day (QID) | ORAL | Status: DC | PRN
  Administered 2022-01-15: 650 mg via ORAL
  Filled 2022-01-15: qty 2

## 2022-01-15 MED ORDER — SODIUM CHLORIDE 0.9 % IV BOLUS
500.0000 mL | Freq: Once | INTRAVENOUS | Status: AC
  Administered 2022-01-15: 500 mL via INTRAVENOUS

## 2022-01-15 MED ORDER — BISACODYL 10 MG RE SUPP
10.0000 mg | Freq: Once | RECTAL | Status: AC
  Administered 2022-01-15: 10 mg via RECTAL
  Filled 2022-01-15: qty 1

## 2022-01-15 MED ORDER — ONDANSETRON HCL 4 MG/2ML IJ SOLN
4.0000 mg | Freq: Once | INTRAMUSCULAR | Status: AC
  Administered 2022-01-15: 4 mg via INTRAVENOUS
  Filled 2022-01-15: qty 2

## 2022-01-15 MED ORDER — SODIUM CHLORIDE 0.9 % IV BOLUS
1000.0000 mL | Freq: Once | INTRAVENOUS | Status: AC
  Administered 2022-01-15: 1000 mL via INTRAVENOUS

## 2022-01-15 MED ORDER — PIPERACILLIN-TAZOBACTAM 3.375 G IVPB
3.3750 g | Freq: Two times a day (BID) | INTRAVENOUS | Status: DC
  Administered 2022-01-16 (×2): 3.375 g via INTRAVENOUS
  Filled 2022-01-15 (×3): qty 50

## 2022-01-15 MED ORDER — PIPERACILLIN-TAZOBACTAM 3.375 G IVPB 30 MIN
3.3750 g | Freq: Once | INTRAVENOUS | Status: AC
Start: 2022-01-15 — End: 2022-01-15
  Administered 2022-01-15: 3.375 g via INTRAVENOUS
  Filled 2022-01-15: qty 50

## 2022-01-15 MED ORDER — ACETAMINOPHEN 650 MG RE SUPP: 650.0000 mg | Freq: Four times a day (QID) | RECTAL | Status: DC | PRN

## 2022-01-15 NOTE — ED Notes (Signed)
Son Alexis Cortez [phone] wants an update immediately and the nurse to know that her pain needs to be managed closely bc she's in a lot of pain ?

## 2022-01-15 NOTE — ED Notes (Signed)
Pt is from [facility] but was at her sister's house, where she was picked up today ?

## 2022-01-15 NOTE — ED Notes (Signed)
Received patient in no acute distress breathing spontaneously to room air. Patient denies complaints at this time . Continuing  monitoring . Pending admission bed.  ?  ?

## 2022-01-15 NOTE — ED Triage Notes (Signed)
Pt BIB [facility] from home c/o abdominal pain. Pt was sitting on the couch when she had a sudden onset of left lower abdominal pain. Pt has a hx of this and constipation. Pt's LBM was yesterday evening. Pt endorses some nausea and rates the pain 8/10.  ?

## 2022-01-15 NOTE — ED Provider Notes (Signed)
?[facility] ?Provider Note ? ? ?CSN: [id number] ?Arrival date & time: 01/15/22  1614 ? ?  ? ?History ? ?Chief Complaint  ?Patient presents with  ? Abdominal Pain  ? ? ?Alexis Cortez is a 88 y.o. female. ? ?Pt is a 88 yo female with a pmhx significant for htn, and macular degeneration.  She is coming from home with abdominal pain.  Pt said it started suddenly while sitting on the couch.  She does not feel that she is constipated.  She does feel nauseous.  She said the pain is an 8/10.  No fevers.  She was feeling fine this am. ? ? ?  ? ?Home Medications ?Prior to Admission medications   ?Medication Sig Start Date End Date Taking? Authorizing Provider  ?albuterol (VENTOLIN HFA) 108 (90 Base) MCG/ACT inhaler Inhale 2 puffs into the lungs every 6 (six) hours as needed for wheezing or shortness of breath. 08/31/19   [provider]  ?amLODipine (NORVASC) 5 MG tablet Take 1 tablet (5 mg total) by mouth daily. 10/10/19   Rai, Vernelle Emerald, MD  ?benzonatate (TESSALON) 100 MG capsule Take 100 mg by mouth 3 (three) times daily. 10/01/19   [provider]  ?busPIRone (BUSPAR) 7.5 MG tablet Take 7.5 mg by mouth 2 (two) times daily.  04/25/19   [provider]  ?[employer] PURELAX 17 GM/SCOOP powder Take 17 g by mouth daily as needed for moderate constipation.  04/09/19   [provider]  ?docusate sodium (COLACE) 100 MG capsule Take 100 mg by mouth daily as needed for mild constipation.     [provider]  ?esomeprazole (NEXIUM) 40 MG capsule Take by mouth. 11/13/19   [provider]  ?levothyroxine (SYNTHROID) 75 MCG tablet Take 75 mcg by mouth daily. 09/27/19   [provider]  ?lisinopril (ZESTRIL) 10 MG tablet Take 10 mg by mouth daily.     [provider]  ?mirtazapine (REMERON) 15 MG tablet Take 15 mg by mouth at bedtime.     [provider]  ?Misc. Devices MISC Knee high compression stockings 18-27mmHg pressure to  be worn during daytime hours and off at night Dx: peripheral edema 04/09/19   [provider]  ?Multiple Vitamin tablet Take 1 tablet by mouth daily.     [provider]  ?pregabalin (LYRICA) 50 MG capsule Take 50 mg by mouth daily.     [provider]  ?traZODone (DESYREL) 100 MG tablet Take 100 mg by mouth at bedtime.     [provider]  ?   ? ?Allergies    ?Patient has no known allergies.   ? ?Review of Systems   ?Review of Systems  ?Gastrointestinal:  Positive for abdominal pain and nausea.  ?All other systems reviewed and are negative. ? ?Physical Exam ?Updated Vital Signs ?BP 133/63   Pulse 100   Temp 98.3 ?F (36.8 ?C) (Oral)   Resp (!) 29   Ht 5\' 2"  (1.575 m)   Wt 68 kg   SpO2 92%   BMI 27.44 kg/m?  ?Physical Exam ?Vitals and nursing note reviewed.  ?Constitutional:   ?   Appearance: She is well-developed.  ?HENT:  ?   Head: Normocephalic and atraumatic.  ?   Mouth/Throat:  ?   Mouth: Mucous membranes are moist.  ?   Pharynx: Oropharynx is clear.  ?Eyes:  ?   Extraocular Movements: Extraocular movements intact.  ?   Pupils: Pupils are equal, round,  and reactive to light.  ?Cardiovascular:  ?   Rate and Rhythm: Normal rate and regular rhythm.  ?   Heart sounds: Normal heart sounds.  ?Pulmonary:  ?   Effort: Pulmonary effort is normal.  ?   Breath sounds: Normal breath sounds.  ?Abdominal:  ?   General: Abdomen is flat. Bowel sounds are normal.  ?   Palpations: Abdomen is soft.  ?   Tenderness: There is generalized abdominal tenderness.  ?Skin: ?   General: Skin is warm.  ?   Capillary Refill: Capillary refill takes less than 2 seconds.  ?Neurological:  ?   General: No focal deficit present.  ?   Mental Status: She is alert and oriented to person, place, and time.  ?Psychiatric:     ?   Mood and Affect: Mood normal.     ?   Behavior: Behavior normal.  ? ? ?ED Results / Procedures / Treatments   ?Labs ?(all labs ordered are listed, but only abnormal results are  displayed) ?Labs Reviewed  ?COMPREHENSIVE METABOLIC PANEL - Abnormal; Notable for the following components:  ?    Result Value  ? CO2 15 (*)   ? Glucose, Bld 177 (*)   ? BUN 33 (*)   ? Creatinine, Ser 1.77 (*)   ? Calcium 8.6 (*)   ? Total Protein 6.2 (*)   ? Albumin 3.4 (*)   ? GFR, Estimated 27 (*)   ? Anion gap 16 (*)   ? All other components within normal limits  ?LACTIC ACID, PLASMA - Abnormal; Notable for the following components:  ? Lactic Acid, Venous 3.3 (*)   ? All other components within normal limits  ?CULTURE, BLOOD (ROUTINE X 2)  ?CULTURE, BLOOD (ROUTINE X 2)  ?CBC WITH DIFFERENTIAL/PLATELET  ?LIPASE, BLOOD  ?URINALYSIS, ROUTINE W REFLEX MICROSCOPIC  ?LACTIC ACID, PLASMA  ?BASIC METABOLIC PANEL  ?CBC  ?LACTIC ACID, PLASMA  ? ? ?EKG ?EKG Interpretation ? ?Date/Time:  Friday January 15 2022 16:19:39 EDT ?Ventricular Rate:  81 ?PR Interval:  166 ?QRS Duration: 88 ?QT Interval:  373 ?QTC Calculation: 433 ?R Axis:   26 ?Text Interpretation: Sinus rhythm Probable inferior infarct, old Consider anterior infarct No significant change since last tracing Confirmed by Isla Pence [phone]) on 01/15/2022 5:41:13 PM ? ?Radiology ?CT ABDOMEN PELVIS WO CONTRAST ? ?Result Date: 01/15/2022 ?CLINICAL DATA:  Left lower quadrant abdominal pain. EXAM: CT ABDOMEN AND PELVIS WITHOUT CONTRAST TECHNIQUE: Multidetector CT imaging of the abdomen and pelvis was performed following the standard protocol without IV contrast. RADIATION DOSE REDUCTION: This exam was performed according to the departmental dose-optimization program which includes automated exposure control, adjustment of the mA and/or kV according to patient size and/or use of iterative reconstruction technique. COMPARISON:  AP chest 10/05/2019 FINDINGS: Lower chest: Small right and possible trace left pleural effusions. Moderate right and mild left associated curvilinear ground-glass posterior lung base densities. Mild cardiomegaly. Mild pericardial fluid.  Unfortunately this study is mildly to moderately limited by patient motion artifact. Lack of intra-articular fluid further limits evaluation of the abdominal and pelvic organ parenchyma. The following findings are made within this limitation. Hepatobiliary: Smooth liver contours. No gross liver lesion is seen. The gallbladder is grossly unremarkable. Pancreas: There is moderate atrophy and fatty infiltration of the pancreas within normal limits for patient age. Spleen: Normal in size without focal abnormality. Adrenals/Urinary Tract: Normal adrenals. No renal stone or hydronephrosis. There is a cyst that is medial to the inferior pole of the  right kidney that appears to represent a parapelvic cyst measuring up to 2.3 cm. Within limitations of streak artifact from bilateral total hip arthroplasty hardware no gross abnormality is seen within the urinary bladder. Stomach/Bowel: Moderate stool is seen within the rectum. There is moderate sigmoid diverticulosis. High-grade stool throughout all segments of the colon. There is air within the lumen around high-grade stool within the proximal sigmoid colon (axial images 42 through 54) and moderate to high-grade air also seen surrounding the colon in this region suspicious for pneumatosis and microperforation. There is also more distant ventral peritoneal free air. The terminal ileum is unremarkable. The appendix is not confidently identified, however no definite inflammatory changes are seen around the cecum to indicate secondary signs of acute appendicitis. Mild-to-moderate sliding hiatal hernia. Mild thickening of the wall of the second and third portions of the duodenum may be secondary to inflammatory change versus underdistention. Vascular/Lymphatic: High-grade atherosclerotic calcifications. No abdominal aortic aneurysm. No abdominopelvic lymphadenopathy. Reproductive: Not well evaluated due to lack of IV contrast, motion artifact and metallic streak artifact. Other:  No free fluid is seen. There is moderate pneumoperitoneum seen ventral to the right upper quadrant left upper quadrant and midline posterior abdomen. There are scattered gas bubbles anterior to the ileum in the superior right he

## 2022-01-15 NOTE — Progress Notes (Signed)
Pharmacy Antibiotic Note ? ?Alexis Cortez is a 88 y.o. female admitted on 01/15/2022 presenting with abdominal pain and pneumoperitoneum.  Pharmacy has been consulted for zosyn dosing. ? ?Plan: ?Zosyn 3.375g IV q 12 hours ?Monitor renal function, clinical progression, surgical plans and LOT ? ?Height: 5\' 2"  (157.5 cm) ?Weight: 68 kg (150 lb) ?IBW/kg (Calculated) : 50.1 ? ?Temp (24hrs), Avg:98.3 ?F (36.8 ?C), Min:98.3 ?F (36.8 ?C), Max:98.3 ?F (36.8 ?C) ? ?Recent Labs  ?Lab 01/15/22 ?1704 01/15/22 ?1930  ?WBC 8.4  --   ?CREATININE 1.77*  --   ?LATICACIDVEN  --  3.3*  ?  ?Estimated Creatinine Clearance: 19.9 mL/min (A) (by C-G formula based on SCr of 1.77 mg/dL (H)).   ? ?No Known Allergies ? ?01/17/22, PharmD ?Clinical Pharmacist ?ED Pharmacist Phone # [phone] ?01/15/2022 9:27 PM ? ? ?

## 2022-01-15 NOTE — H&P (Signed)
Alexis Cortez is an 88 y.o. female.   ?Chief Complaint: ab pain ?HPI: 47 yof who lives in assisted living. Per her son Alexis Cortez she needs help getting dressed.  She uses a walker. For past 3 weeks she has not been eating. She has struggled with constipation her whole life. She has been disimpacted  a couple times recently.  She also had some loose stool placed on an antidiarrheal at facility as well.  She had ab pain today.  This is present. Nothing making it better. No fever. No emesis. Last bm yesterday. She is really nonverbal when I see her.  Her son Alexis Cortez is her medical poa and he is present. I eventually also discussed this with her sister Alexis Cortez via telephone.  She underwent evaluation and was found to have normal vitals, normal wbc, cr 1.77.  she had ct scan that shows some pneumoperitoneum, diverticuli, air around sigmoid colon with large stool burden. I was asked to see her.   ? ?Past Medical History:  ?Diagnosis Date  ? Hypertension   ? Hypertensive retinopathy   ? OU  ? Macular degeneration   ? Wet OD, Dry OS  ? ? ?Past Surgical History:  ?Procedure Laterality Date  ? CATARACT EXTRACTION Bilateral   ? EYE SURGERY    ? YAG LASER APPLICATION Right   ?Prior elap for possible appendicitis remotely (neg for appy) ? ?History reviewed. No pertinent family history. ?Social History:  reports that she has never smoked. She has never used smokeless tobacco. She reports current alcohol use of about 2.0 standard drinks per week. She reports that she does not use drugs. ? ?Allergies: No Known Allergies ? ?No current facility-administered medications on file prior to encounter.  ? ?Current Outpatient Medications on File Prior to Encounter  ?Medication Sig Dispense Refill  ? albuterol (VENTOLIN HFA) 108 (90 Base) MCG/ACT inhaler Inhale 2 puffs into the lungs every 6 (six) hours as needed for wheezing or shortness of breath.    ? amLODipine (NORVASC) 5 MG tablet Take 1 tablet (5 mg total) by mouth daily.    ? benzonatate  (TESSALON) 100 MG capsule Take 100 mg by mouth 3 (three) times daily.    ? busPIRone (BUSPAR) 7.5 MG tablet Take 7.5 mg by mouth 2 (two) times daily.     ? [employer] PURELAX 17 GM/SCOOP powder Take 17 g by mouth daily as needed for moderate constipation.     ? docusate sodium (COLACE) 100 MG capsule Take 100 mg by mouth daily as needed for mild constipation.     ? esomeprazole (NEXIUM) 40 MG capsule Take by mouth.    ? levothyroxine (SYNTHROID) 75 MCG tablet Take 75 mcg by mouth daily.    ? lisinopril (ZESTRIL) 10 MG tablet Take 10 mg by mouth daily.     ? mirtazapine (REMERON) 15 MG tablet Take 15 mg by mouth at bedtime.     ? Misc. Devices MISC Knee high compression stockings 18-75mmHg pressure to be worn during daytime hours and off at night Dx: peripheral edema    ? Multiple Vitamin tablet Take 1 tablet by mouth daily.     ? pregabalin (LYRICA) 50 MG capsule Take 50 mg by mouth daily.     ? traZODone (DESYREL) 100 MG tablet Take 100 mg by mouth at bedtime.     ? ? ? ? ?Results for orders placed or performed during the hospital encounter of 01/15/22 (from the past 48 hour(s))  ?CBC with Differential  Status: None  ? Collection Time: 01/15/22  5:04 PM  ?Result Value Ref Range  ? WBC 8.4 4.0 - 10.5 K/uL  ? RBC 4.73 3.87 - 5.11 MIL/uL  ? Hemoglobin 14.2 12.0 - 15.0 g/dL  ? HCT 44.7 36.0 - 46.0 %  ? MCV 94.5 80.0 - 100.0 fL  ? MCH 30.0 26.0 - 34.0 pg  ? MCHC 31.8 30.0 - 36.0 g/dL  ? RDW 13.4 11.5 - 15.5 %  ? Platelets 172 150 - 400 K/uL  ? nRBC 0.0 0.0 - 0.2 %  ? Neutrophils Relative % 79 %  ? Neutro Abs 6.6 1.7 - 7.7 K/uL  ? Lymphocytes Relative 16 %  ? Lymphs Abs 1.3 0.7 - 4.0 K/uL  ? Monocytes Relative 4 %  ? Monocytes Absolute 0.3 0.1 - 1.0 K/uL  ? Eosinophils Relative 1 %  ? Eosinophils Absolute 0.1 0.0 - 0.5 K/uL  ? Basophils Relative 0 %  ? Basophils Absolute 0.0 0.0 - 0.1 K/uL  ? Immature Granulocytes 0 %  ? Abs Immature Granulocytes 0.02 0.00 - 0.07 K/uL  ?  Comment: Performed at [facility], [place] [address] [place], [place] [address]  ?Comprehensive metabolic panel     Status: Abnormal  ? Collection Time: 01/15/22  5:04 PM  ?Result Value Ref Range  ? Sodium 137 135 - 145 mmol/L  ? Potassium 4.3 3.5 - 5.1 mmol/L  ? Chloride 106 98 - 111 mmol/L  ? CO2 15 (L) 22 - 32 mmol/L  ? Glucose, Bld 177 (H) 70 - 99 mg/dL  ?  Comment: Glucose reference range applies only to samples taken after fasting for at least 8 hours.  ? BUN 33 (H) 8 - 23 mg/dL  ? Creatinine, Ser 1.77 (H) 0.44 - 1.00 mg/dL  ? Calcium 8.6 (L) 8.9 - 10.3 mg/dL  ? Total Protein 6.2 (L) 6.5 - 8.1 g/dL  ? Albumin 3.4 (L) 3.5 - 5.0 g/dL  ? AST 28 15 - 41 U/L  ? ALT 13 0 - 44 U/L  ? Alkaline Phosphatase 62 38 - 126 U/L  ? Total Bilirubin 1.0 0.3 - 1.2 mg/dL  ? GFR, Estimated 27 (L) >60 mL/min  ?  Comment: (NOTE) ?Calculated using the CKD-EPI Creatinine Equation (2021) ?  ? Anion gap 16 (H) 5 - 15  ?  Comment: Performed at [facility], [place] [address] [place], [place] [address]  ?Lipase, blood     Status: None  ? Collection Time: 01/15/22  5:04 PM  ?Result Value Ref Range  ? Lipase 37 11 - 51 U/L  ?  Comment: Performed at [facility], [place] [address] [place], [place] [address]  ?Lactic acid, plasma     Status: Abnormal  ? Collection Time: 01/15/22  7:30 PM  ?Result Value Ref Range  ? Lactic Acid, Venous 3.3 (HH) 0.5 - 1.9 mmol/L  ?  Comment: CRITICAL RESULT CALLED TO, READ BACK BY AND VERIFIED WITH: ?C.COBB,RN @2014  01/15/2022 [email or web address] ?Performed at [facility], [place] [address], [place], [place] [address] ?  ? ?CT ABDOMEN PELVIS WO CONTRAST ? ?Result Date: 01/15/2022 ?CLINICAL DATA:  Left lower quadrant abdominal pain. EXAM: CT ABDOMEN AND PELVIS WITHOUT CONTRAST TECHNIQUE: Multidetector CT imaging of the abdomen and pelvis was performed following the standard protocol without IV contrast. RADIATION DOSE REDUCTION: This exam was performed according to the departmental dose-optimization program which includes automated exposure control, adjustment  of the mA and/or kV  according to patient size and/or use of iterative reconstruction technique. COMPARISON:  AP chest 10/05/2019 FINDINGS: Lower chest: Small right and possible trace left pleural effusions. Moderate right and mild left associated curvilinear ground-glass posterior lung base densities. Mild cardiomegaly. Mild pericardial fluid. Unfortunately this study is mildly to moderately limited by patient motion artifact. Lack of intra-articular fluid further limits evaluation of the abdominal and pelvic organ parenchyma. The following findings are made within this limitation. Hepatobiliary: Smooth liver contours. No gross liver lesion is seen. The gallbladder is grossly unremarkable. Pancreas: There is moderate atrophy and fatty infiltration of the pancreas within normal limits for patient age. Spleen: Normal in size without focal abnormality. Adrenals/Urinary Tract: Normal adrenals. No renal stone or hydronephrosis. There is a cyst that is medial to the inferior pole of the right kidney that appears to represent a parapelvic cyst measuring up to 2.3 cm. Within limitations of streak artifact from bilateral total hip arthroplasty hardware no gross abnormality is seen within the urinary bladder. Stomach/Bowel: Moderate stool is seen within the rectum. There is moderate sigmoid diverticulosis. High-grade stool throughout all segments of the colon. There is air within the lumen around high-grade stool within the proximal sigmoid colon (axial images 42 through 54) and moderate to high-grade air also seen surrounding the colon in this region suspicious for pneumatosis and microperforation. There is also more distant ventral peritoneal free air. The terminal ileum is unremarkable. The appendix is not confidently identified, however no definite inflammatory changes are seen around the cecum to indicate secondary signs of acute appendicitis. Mild-to-moderate sliding hiatal hernia. Mild thickening of the wall of the  second and third portions of the duodenum may be secondary to inflammatory change versus underdistention. Vascular/Lymphatic: High-grade atherosclerotic calcifications. No abdominal aortic aneurysm. No abdom

## 2022-01-16 ENCOUNTER — Encounter (HOSPITAL_COMMUNITY): Admission: EM | Disposition: A | Discharge status: Hospice | Payer: Self-pay | Source: Home / Self Care

## 2022-01-16 ENCOUNTER — Inpatient Hospital Stay (HOSPITAL_COMMUNITY): Payer: Medicare PPO | Admitting: Anesthesiology

## 2022-01-16 ENCOUNTER — Inpatient Hospital Stay (HOSPITAL_COMMUNITY): Payer: Medicare PPO

## 2022-01-16 ENCOUNTER — Encounter (HOSPITAL_COMMUNITY): Payer: Self-pay

## 2022-01-16 ENCOUNTER — Other Ambulatory Visit: Payer: Self-pay

## 2022-01-16 DIAGNOSIS — R6521 Severe sepsis with septic shock: Secondary | ICD-10-CM | POA: Diagnosis not present

## 2022-01-16 DIAGNOSIS — N179 Acute kidney failure, unspecified: Secondary | ICD-10-CM

## 2022-01-16 DIAGNOSIS — J9600 Acute respiratory failure, unspecified whether with hypoxia or hypercapnia: Secondary | ICD-10-CM | POA: Diagnosis not present

## 2022-01-16 DIAGNOSIS — K668 Other specified disorders of peritoneum: Secondary | ICD-10-CM

## 2022-01-16 DIAGNOSIS — K659 Peritonitis, unspecified: Secondary | ICD-10-CM

## 2022-01-16 DIAGNOSIS — K631 Perforation of intestine (nontraumatic): Secondary | ICD-10-CM

## 2022-01-16 DIAGNOSIS — K275 Chronic or unspecified peptic ulcer, site unspecified, with perforation: Secondary | ICD-10-CM | POA: Diagnosis present

## 2022-01-16 DIAGNOSIS — A419 Sepsis, unspecified organism: Secondary | ICD-10-CM

## 2022-01-16 DIAGNOSIS — I10 Essential (primary) hypertension: Secondary | ICD-10-CM

## 2022-01-16 HISTORY — PX: COLECTOMY: SHX59

## 2022-01-16 HISTORY — PX: COLOSTOMY: SHX63

## 2022-01-16 LAB — CBC
HCT: 45 % (ref 36.0–46.0)
Hemoglobin: 14.3 g/dL (ref 12.0–15.0)
MCH: 29.8 pg (ref 26.0–34.0)
MCHC: 31.8 g/dL (ref 30.0–36.0)
MCV: 93.8 fL (ref 80.0–100.0)
Platelets: 222 10*3/uL (ref 150–400)
RBC: 4.8 MIL/uL (ref 3.87–5.11)
RDW: 13.6 % (ref 11.5–15.5)
WBC: 2.5 10*3/uL — ABNORMAL LOW (ref 4.0–10.5)
nRBC: 0 % (ref 0.0–0.2)

## 2022-01-16 LAB — BASIC METABOLIC PANEL
Anion gap: 9 (ref 5–15)
BUN: 32 mg/dL — ABNORMAL HIGH (ref 8–23)
CO2: 12 mmol/L — ABNORMAL LOW (ref 22–32)
Calcium: 6.9 mg/dL — ABNORMAL LOW (ref 8.9–10.3)
Chloride: 118 mmol/L — ABNORMAL HIGH (ref 98–111)
Creatinine, Ser: 1.82 mg/dL — ABNORMAL HIGH (ref 0.44–1.00)
GFR, Estimated: 26 mL/min — ABNORMAL LOW (ref >60)
Glucose, Bld: 180 mg/dL — ABNORMAL HIGH (ref 70–99)
Potassium: 3.5 mmol/L (ref 3.5–5.1)
Sodium: 139 mmol/L (ref 135–145)

## 2022-01-16 LAB — POCT I-STAT 7, (LYTES, BLD GAS, ICA,H+H)
Acid-base deficit: 14 mmol/L — ABNORMAL HIGH (ref 0.0–2.0)
Bicarbonate: 12.8 mmol/L — ABNORMAL LOW (ref 20.0–28.0)
Calcium, Ion: 1.15 mmol/L (ref 1.15–1.40)
HCT: 42 % (ref 36.0–46.0)
Hemoglobin: 14.3 g/dL (ref 12.0–15.0)
O2 Saturation: 98 %
Patient temperature: 97.3
Potassium: 3.8 mmol/L (ref 3.5–5.1)
Sodium: 141 mmol/L (ref 135–145)
TCO2: 14 mmol/L — ABNORMAL LOW (ref 22–32)
pCO2 arterial: 30 mmHg — ABNORMAL LOW (ref 32–48)
pH, Arterial: 7.234 — ABNORMAL LOW (ref 7.35–7.45)
pO2, Arterial: 112 mmHg — ABNORMAL HIGH (ref 83–108)

## 2022-01-16 LAB — ECHOCARDIOGRAM LIMITED
Height: 62 in
Weight: 2400 oz

## 2022-01-16 LAB — GLUCOSE, CAPILLARY
Glucose-Capillary: 135 mg/dL — ABNORMAL HIGH (ref 70–99)
Glucose-Capillary: 172 mg/dL — ABNORMAL HIGH (ref 70–99)
Glucose-Capillary: 184 mg/dL — ABNORMAL HIGH (ref 70–99)
Glucose-Capillary: 94 mg/dL (ref 70–99)

## 2022-01-16 LAB — LACTIC ACID, PLASMA: Lactic Acid, Venous: 4.1 mmol/L (ref 0.5–1.9)

## 2022-01-16 LAB — MAGNESIUM: Magnesium: 1.8 mg/dL (ref 1.7–2.4)

## 2022-01-16 LAB — PHOSPHORUS: Phosphorus: 3 mg/dL (ref 2.5–4.6)

## 2022-01-16 LAB — RESP PANEL BY RT-PCR (FLU A&B, COVID) ARPGX2
Influenza A by PCR: NEGATIVE
Influenza B by PCR: NEGATIVE
SARS Coronavirus 2 by RT PCR: NEGATIVE

## 2022-01-16 LAB — MRSA NEXT GEN BY PCR, NASAL: MRSA by PCR Next Gen: NOT DETECTED

## 2022-01-16 SURGERY — COLECTOMY, TOTAL
Anesthesia: General | Site: Abdomen

## 2022-01-16 MED ORDER — ACETAMINOPHEN 10 MG/ML IV SOLN
1000.0000 mg | Freq: Four times a day (QID) | INTRAVENOUS | Status: AC
  Administered 2022-01-16 – 2022-01-17 (×4): 1000 mg via INTRAVENOUS
  Filled 2022-01-16 (×4): qty 100

## 2022-01-16 MED ORDER — PERFLUTREN LIPID MICROSPHERE
1.0000 mL | INTRAVENOUS | Status: AC | PRN
  Administered 2022-01-16: 3 mL via INTRAVENOUS
  Filled 2022-01-16: qty 10

## 2022-01-16 MED ORDER — FENTANYL CITRATE (PF) 100 MCG/2ML IJ SOLN
25.0000 ug | INTRAMUSCULAR | Status: DC | PRN
  Administered 2022-01-16 – 2022-01-17 (×4): 100 ug via INTRAVENOUS
  Filled 2022-01-16 (×4): qty 2

## 2022-01-16 MED ORDER — PROPOFOL 10 MG/ML IV BOLUS
INTRAVENOUS | Status: DC | PRN
  Administered 2022-01-16: 110 mg via INTRAVENOUS

## 2022-01-16 MED ORDER — SUCCINYLCHOLINE CHLORIDE 200 MG/10ML IV SOSY
PREFILLED_SYRINGE | INTRAVENOUS | Status: DC | PRN
  Administered 2022-01-16: 100 mg via INTRAVENOUS

## 2022-01-16 MED ORDER — VASOPRESSIN 20 UNIT/ML IV SOLN
INTRAVENOUS | Status: AC
  Filled 2022-01-16: qty 1

## 2022-01-16 MED ORDER — FENTANYL CITRATE PF 50 MCG/ML IJ SOSY: 25.0000 ug | PREFILLED_SYRINGE | INTRAMUSCULAR | Status: DC | PRN

## 2022-01-16 MED ORDER — CHLORHEXIDINE GLUCONATE CLOTH 2 % EX PADS
6.0000 | MEDICATED_PAD | Freq: Every day | CUTANEOUS | Status: DC
  Administered 2022-01-16 – 2022-01-27 (×12): 6 via TOPICAL

## 2022-01-16 MED ORDER — ORAL CARE MOUTH RINSE
15.0000 mL | OROMUCOSAL | Status: DC
  Administered 2022-01-16 – 2022-01-18 (×21): 15 mL via OROMUCOSAL

## 2022-01-16 MED ORDER — LIDOCAINE HCL (CARDIAC) PF 100 MG/5ML IV SOSY
PREFILLED_SYRINGE | INTRAVENOUS | Status: DC | PRN
  Administered 2022-01-16: 40 mg via INTRATRACHEAL

## 2022-01-16 MED ORDER — ONDANSETRON 4 MG PO TBDP
4.0000 mg | ORAL_TABLET | Freq: Four times a day (QID) | ORAL | Status: DC | PRN
  Filled 2022-01-16: qty 1

## 2022-01-16 MED ORDER — PROPOFOL 1000 MG/100ML IV EMUL
INTRAVENOUS | Status: AC
  Filled 2022-01-16: qty 100

## 2022-01-16 MED ORDER — STERILE WATER FOR INJECTION IV SOLN
INTRAMUSCULAR | Status: DC
  Filled 2022-01-16 (×3): qty 1000

## 2022-01-16 MED ORDER — 0.9 % SODIUM CHLORIDE (POUR BTL) OPTIME
TOPICAL | Status: DC | PRN
  Administered 2022-01-16: 3000 mL

## 2022-01-16 MED ORDER — LACTATED RINGERS IV SOLN: INTRAVENOUS | Status: DC | PRN

## 2022-01-16 MED ORDER — NOREPINEPHRINE 4 MG/250ML-% IV SOLN
INTRAVENOUS | Status: AC
  Administered 2022-01-16: 7 ug/min via INTRAVENOUS
  Filled 2022-01-16: qty 250

## 2022-01-16 MED ORDER — SODIUM CHLORIDE 0.9 % IV SOLN: INTRAVENOUS | Status: DC

## 2022-01-16 MED ORDER — EPHEDRINE SULFATE (PRESSORS) 50 MG/ML IJ SOLN
INTRAMUSCULAR | Status: DC | PRN
  Administered 2022-01-16: 10 mg via INTRAVENOUS

## 2022-01-16 MED ORDER — CHLORHEXIDINE GLUCONATE 0.12% ORAL RINSE (MEDLINE KIT)
15.0000 mL | Freq: Two times a day (BID) | OROMUCOSAL | Status: DC
  Administered 2022-01-16 – 2022-01-27 (×20): 15 mL via OROMUCOSAL

## 2022-01-16 MED ORDER — NOREPINEPHRINE 4 MG/250ML-% IV SOLN: 0.0000 ug/min | INTRAVENOUS | Status: DC

## 2022-01-16 MED ORDER — NOREPINEPHRINE 4 MG/250ML-% IV SOLN
2.0000 ug/min | INTRAVENOUS | Status: DC
  Administered 2022-01-16: 6 ug/min via INTRAVENOUS
  Filled 2022-01-16 (×2): qty 250

## 2022-01-16 MED ORDER — POLYETHYLENE GLYCOL 3350 17 G PO PACK: 17.0000 g | PACK | Freq: Every day | ORAL | Status: DC

## 2022-01-16 MED ORDER — DOCUSATE SODIUM 50 MG/5ML PO LIQD
100.0000 mg | Freq: Two times a day (BID) | ORAL | Status: DC
  Filled 2022-01-16: qty 10

## 2022-01-16 MED ORDER — SODIUM CHLORIDE 0.9 % IV SOLN: 250.0000 mL | INTRAVENOUS | Status: DC

## 2022-01-16 MED ORDER — PROPOFOL 1000 MG/100ML IV EMUL: 0.0000 ug/kg/min | INTRAVENOUS | Status: DC

## 2022-01-16 MED ORDER — PHENYLEPHRINE HCL (PRESSORS) 10 MG/ML IV SOLN
INTRAVENOUS | Status: DC | PRN
  Administered 2022-01-16 (×2): 80 ug via INTRAVENOUS
  Administered 2022-01-16: 120 ug via INTRAVENOUS

## 2022-01-16 MED ORDER — FENTANYL CITRATE (PF) 250 MCG/5ML IJ SOLN
INTRAMUSCULAR | Status: DC | PRN
  Administered 2022-01-16 (×5): 50 ug via INTRAVENOUS

## 2022-01-16 MED ORDER — PIPERACILLIN-TAZOBACTAM IN DEX 2-0.25 GM/50ML IV SOLN
2.2500 g | Freq: Four times a day (QID) | INTRAVENOUS | Status: DC
  Administered 2022-01-16 – 2022-01-17 (×3): 2.25 g via INTRAVENOUS
  Filled 2022-01-16 (×4): qty 50

## 2022-01-16 MED ORDER — PHENYLEPHRINE HCL-NACL 20-0.9 MG/250ML-% IV SOLN
INTRAVENOUS | Status: DC | PRN
  Administered 2022-01-16: 40 ug/min via INTRAVENOUS

## 2022-01-16 MED ORDER — ROCURONIUM BROMIDE 100 MG/10ML IV SOLN
INTRAVENOUS | Status: DC | PRN
  Administered 2022-01-16: 30 mg via INTRAVENOUS
  Administered 2022-01-16: 50 mg via INTRAVENOUS

## 2022-01-16 MED ORDER — ONDANSETRON HCL 4 MG/2ML IJ SOLN: 4.0000 mg | Freq: Four times a day (QID) | INTRAMUSCULAR | Status: DC | PRN

## 2022-01-16 MED ORDER — PHENYLEPHRINE HCL-NACL 20-0.9 MG/250ML-% IV SOLN
25.0000 ug/min | INTRAVENOUS | Status: DC
  Administered 2022-01-16 (×2): 200 ug/min via INTRAVENOUS
  Administered 2022-01-16: 170 ug/min via INTRAVENOUS
  Administered 2022-01-16: 190 ug/min via INTRAVENOUS
  Administered 2022-01-16: 150 ug/min via INTRAVENOUS
  Administered 2022-01-16: 120 ug/min via INTRAVENOUS
  Administered 2022-01-16: 100 ug/min via INTRAVENOUS
  Filled 2022-01-16: qty 500
  Filled 2022-01-16 (×6): qty 250

## 2022-01-16 MED ORDER — HEPARIN SODIUM (PORCINE) 5000 UNIT/ML IJ SOLN
5000.0000 [IU] | Freq: Three times a day (TID) | INTRAMUSCULAR | Status: DC
  Administered 2022-01-16: 5000 [IU] via SUBCUTANEOUS

## 2022-01-16 MED ORDER — FENTANYL CITRATE (PF) 250 MCG/5ML IJ SOLN
INTRAMUSCULAR | Status: AC
  Filled 2022-01-16: qty 5

## 2022-01-16 MED ORDER — MIDAZOLAM HCL 2 MG/2ML IJ SOLN
INTRAMUSCULAR | Status: AC
  Filled 2022-01-16: qty 2

## 2022-01-16 SURGICAL SUPPLY — 68 items
APL PRP STRL LF DISP 70% ISPRP (MISCELLANEOUS) ×2
BAG COUNTER SPONGE SURGICOUNT (BAG) ×3 IMPLANT
BAG SPNG CNTER NS LX DISP (BAG) ×2
BIOPATCH WHT 1IN DISK W/4.0 H (GAUZE/BANDAGES/DRESSINGS) ×1 IMPLANT
BLADE CLIPPER SURG (BLADE) IMPLANT
BNDG GAUZE ELAST 4 BULKY (GAUZE/BANDAGES/DRESSINGS) ×2 IMPLANT
CANISTER SUCT 3000ML PPV (MISCELLANEOUS) ×3 IMPLANT
CHLORAPREP W/TINT 26 (MISCELLANEOUS) ×3 IMPLANT
COVER MAYO STAND STRL (DRAPES) ×6 IMPLANT
COVER SURGICAL LIGHT HANDLE (MISCELLANEOUS) ×3 IMPLANT
DRAIN CHANNEL 19F RND (DRAIN) ×2 IMPLANT
DRAPE HALF SHEET 40X57 (DRAPES) ×6 IMPLANT
DRAPE LAPAROSCOPIC ABDOMINAL (DRAPES) ×3 IMPLANT
DRAPE UTILITY XL STRL (DRAPES) ×3 IMPLANT
DRAPE WARM FLUID 44X44 (DRAPES) ×3 IMPLANT
DRSG OPSITE POSTOP 4X10 (GAUZE/BANDAGES/DRESSINGS) IMPLANT
DRSG OPSITE POSTOP 4X8 (GAUZE/BANDAGES/DRESSINGS) IMPLANT
DRSG TEGADERM 4X4.75 (GAUZE/BANDAGES/DRESSINGS) ×1 IMPLANT
ELECT BLADE 6.5 EXT (BLADE) ×3 IMPLANT
ELECT CAUTERY BLADE 6.4 (BLADE) ×6 IMPLANT
ELECT REM PT RETURN 9FT ADLT (ELECTROSURGICAL) ×3
ELECTRODE REM PT RTRN 9FT ADLT (ELECTROSURGICAL) ×2 IMPLANT
EVACUATOR SILICONE 100CC (DRAIN) ×1 IMPLANT
GAUZE SPONGE 4X4 12PLY STRL (GAUZE/BANDAGES/DRESSINGS) ×1 IMPLANT
GLOVE SURG ENC MOIS LTX SZ7 (GLOVE) ×12 IMPLANT
GLOVE SURG UNDER POLY LF SZ7 (GLOVE) ×1 IMPLANT
GLOVE SURG UNDER POLY LF SZ7.5 (GLOVE) ×6 IMPLANT
GOWN STRL REUS W/ TWL LRG LVL3 (GOWN DISPOSABLE) ×12 IMPLANT
GOWN STRL REUS W/TWL LRG LVL3 (GOWN DISPOSABLE) ×18
HANDLE SUCTION POOLE (INSTRUMENTS) ×2 IMPLANT
KIT BASIN OR (CUSTOM PROCEDURE TRAY) ×3 IMPLANT
KIT OSTOMY DRAINABLE 2.75 STR (WOUND CARE) ×1 IMPLANT
KIT SIGMOIDOSCOPE (SET/KITS/TRAYS/PACK) IMPLANT
KIT TURNOVER KIT B (KITS) ×3 IMPLANT
LEGGING LITHOTOMY PAIR STRL (DRAPES) IMPLANT
LIGASURE IMPACT 36 18CM CVD LR (INSTRUMENTS) ×3 IMPLANT
NS IRRIG 1000ML POUR BTL (IV SOLUTION) ×5 IMPLANT
PACK GENERAL/GYN (CUSTOM PROCEDURE TRAY) ×3 IMPLANT
PAD ABD 8X10 STRL (GAUZE/BANDAGES/DRESSINGS) ×2 IMPLANT
PAD ARMBOARD 7.5X6 YLW CONV (MISCELLANEOUS) ×6 IMPLANT
PENCIL SMOKE EVACUATOR (MISCELLANEOUS) ×3 IMPLANT
RELOAD LINEAR CUT PROX 55 BLUE (ENDOMECHANICALS) ×6 IMPLANT
RELOAD STAPLE 55 3.8 BLU REG (ENDOMECHANICALS) IMPLANT
SPONGE T-LAP 18X18 ~~LOC~~+RFID (SPONGE) IMPLANT
STAPLER PROXIMATE 55 BLUE (STAPLE) ×1 IMPLANT
STAPLER VISISTAT 35W (STAPLE) ×2 IMPLANT
SUCTION POOLE HANDLE (INSTRUMENTS) ×3
SURGILUBE 2OZ TUBE FLIPTOP (MISCELLANEOUS) IMPLANT
SUT ETHILON 2 0 FS 18 (SUTURE) ×1 IMPLANT
SUT PDS AB 1 TP1 96 (SUTURE) ×6 IMPLANT
SUT PROLENE 2 0 CT2 30 (SUTURE) IMPLANT
SUT PROLENE 2 0 KS (SUTURE) IMPLANT
SUT SILK 2 0 SH CR/8 (SUTURE) ×3 IMPLANT
SUT SILK 2 0 TIES 10X30 (SUTURE) ×3 IMPLANT
SUT SILK 3 0 SH CR/8 (SUTURE) ×3 IMPLANT
SUT SILK 3 0 TIES 10X30 (SUTURE) ×3 IMPLANT
SUT VIC AB 3-0 SH 18 (SUTURE) ×3 IMPLANT
SUT VIC AB 3-0 SH 27 (SUTURE)
SUT VIC AB 3-0 SH 27X BRD (SUTURE) IMPLANT
SUT VIC AB 4-0 SH 27 (SUTURE)
SUT VIC AB 4-0 SH 27XANBCTRL (SUTURE) IMPLANT
SYR BULB IRRIG 60ML STRL (SYRINGE) ×3 IMPLANT
TOWEL GREEN STERILE (TOWEL DISPOSABLE) ×6 IMPLANT
TRAY FOLEY MTR SLVR 14FR STAT (SET/KITS/TRAYS/PACK) IMPLANT
TRAY FOLEY MTR SLVR 16FR STAT (SET/KITS/TRAYS/PACK) ×1 IMPLANT
TUBE CONNECTING 12X1/4 (SUCTIONS) ×3 IMPLANT
UNDERPAD 30X36 HEAVY ABSORB (UNDERPADS AND DIAPERS) IMPLANT
YANKAUER SUCT BULB TIP NO VENT (SUCTIONS) ×3 IMPLANT

## 2022-01-16 NOTE — Op Note (Addendum)
Preoperative diagnosis: Perforated sigmoid colon with peritonitis ?Postoperative diagnosis: Stercoral ulcer with perforation ?Procedure: ?1.  Sigmoid colectomy ?2.  End colostomy ?Surgeon: Dr. Harden Mo ?Anesthesia: General ?Estimated blood loss: Minimal ?Specimens: Sigmoid colon with stitch marking proximal ?Drains: 34 [place] Blake to the pelvis ?Complications: None ?Sponge count was correct at completion ?Disposition to recovery stable condition ? ?Indications: This is an 87 year old female assisted-living resident who presents with abdominal pain.  She has a CT scan that shows what appears to be a microperforation and some free air.  We initially had discussed just trying antibiotics for this due to her age and after calculating her risk.  I reexamined her and discussed with her and her son that we needed to go to the operating room. ? ?Procedure: After informed consent was obtained the patient was taken to the operating room.  She was given antibiotics.  SCDs were in place.  She was placed under general anesthesia without complication.  She was prepped and draped in the standard sterile surgical fashion a surgical timeout was then performed. ? ?She had a Foley catheter placed.  She had a nasogastric but I never could really confirm this to be in right position.  I then made a midline incision.  She had some adhesions from prior surgery.  I entered into her abdomen and there was a fair amount of murky fluid as well as some stool near a portion of her sigmoid colon that had perforated from what appeared to be a stercoral ulcer.  I examined the remainder of her abdomen.  The remainder of her colon was healthy all the way to her cecum.  Her small bowel was also healthy.  I then elected to divide the primary proximal rectum with a GIA stapler.  I then used the LigaSure device to divide the mesentery taking care to avoid the ureter.  I then chose a portion on the distal left colon to divide this where it was  healthy as well.  Most of her colon had a fair amount of stool present in it.  This was consistent with her chronic constipation.  I then remove the sigmoid colon passed off the table.  I irrigated copiously.  I reexamined everything and it all looked healthy.  I then made a circular incision in the skin and took this down to the rectus fascia.  I made a cruciate incision and entered into the peritoneum.  After spreading the muscle.  I dilated this tract and brought the left colon through it.  This appeared to be healthy.  I then closed the midline incision with #1 looped PDS.  The stoma was then matured with 3-0 Vicryl and was viable.  A dressing was placed.  The drain was secured with a 2-0 nylon suture.  She tolerated this pretty well and was transferred to the ICU intubated. ? ?Upon entering the abdomen (organ space), I encountered feculent peritonitis. ? ?CASE DATA: ? ?Type of patient?: DOW CASE (Surgical Hospitalist [facility]) ? ?Status of Case? EMERGENT Add On ? ?Infection Present At Time Of Surgery (PATOS)?  FECULENT PERITONITIS ? ? ? ? ? ? ? ? ?

## 2022-01-16 NOTE — Consult Note (Signed)
? ?NAME:  Alexis Cortez, MRN:  [id number], DOB:  Mar 14, 1931, LOS: 1 ?ADMISSION DATE:  01/15/2022, CONSULTATION DATE:  4/1 ?REFERRING MD:  Donne Hazel, CHIEF COMPLAINT:  Post op resp failure  ? ?History of Present Illness:  ?Patient is intubated. Therefore history has been obtained from chart review.  ? ?Alexis Cortez, is a 88 y.o. female, who presented to the [facility] ED via EMS with a chief complaint of lower abdominal pain ? ?They have a pertinent past medical history of dementia, hypothyroidism, HTN, resident of assisted living.  ? ?ED course was notable for CT abdomen and pelvis with moderate pneumoperitoneum, mild sigmoid diverticulosis.  General surgery was consulted and admitted the patient.  They were started on Zosyn.  Overnight the patient became tachycardic and hypotensive.  The patient was taken to the OR.  Patient was found to have a Stercoral ulcer with perforation and underwent a sigmoid colectomy and end colostomy.  She was left intubated postoperatively. ? ?PCCM was consulted for ICU and ventilator management. ? ?Pertinent  Medical History  ?dementia, hypothyroidism, HTN, resident of assisted living.  ? ?Significant Hospital Events: ?Including procedures, antibiotic start and stop dates in addition to other pertinent events   ?3/31 presented with Abd pain, CT abdomen and pelvis with moderate pneumoperitoneum, mild sigmoid diverticulosis, GS consult ?4/1 OR with GS, sigmoid colectomy and end colostomy, intubated post op, pccm consult ? ?Interim History / Subjective:  ?See above ? ?Intubated/sedated ?  ?Prop 10, neo 150 ? ?Unable to obtain subjective evaluation due to patient status ? ?Objective   ?Blood pressure (!) 89/65, pulse 98, temperature 98.3 ?F (36.8 ?C), temperature source Oral, resp. rate 18, height 5\' 2"  (1.575 m), weight 68 kg, SpO2 100 %. ?   ?Vent Mode: PRVC ?FiO2 (%):  [60 %-100 %] 60 % ?Set Rate:  [18 bmp] 18 bmp ?Vt Set:  [400 mL] 400 mL ?PEEP:  [5 cmH20] 5 cmH20 ?Plateau Pressure:  [14  cmH20] 14 cmH20  ? ?Intake/Output Summary (Last 24 hours) at 01/16/2022 0434 ?Last data filed at 01/16/2022 0400 ?Gross per 24 hour  ?Intake 1850 ml  ?Output 440 ml  ?Net 1410 ml  ? ?Filed Weights  ? 01/15/22 1617  ?Weight: 68 kg  ? ? ?Examination: ?General: In bed, NAD, appears comfortable, frail appearing ?HEENT: MM pink/moist, anicteric, atraumatic ?Neuro: sedated,  PERRL 54mm ?CV: S1S2, NSR, no m/r/g appreciated ?PULM:  clear in the upper lobes, clear in the lower lobes, trachea midline, chest expansion symmetric ?GI: Fresh colostomy with small mount of blood around stoma, bowel sounds hypoactive, midline dressing clean dry and intact. ?Extremities: cool/dry, no pretibial edema, capillary refill less than 3 seconds  ?Skin: Postoperative midline incision c/d/I/, JP drain, no other rashes or lesions noted ? ?Labs ?CXR: pending ?Lactate 3.3. ?UA pending ?CBC: WBC 8.4>2.5 ?CMP: Creat 1.77, BUN 33, Albumin 3.4 ?Lipase WNL ?CT abdomen: moderate pneumoperitoneum. There is mild sigmoid diverticulosis with air tracking circumferentially around the proximal sigmoid colon wall concerning for wall micro rupture in this region. There appears to be extra colonic free air around an approximate 7 cm length of the sigmoid colon- per radiology  ? ?Resolved Hospital Problem list   ? ? ?Assessment & Plan:  ?Stercoral ulcer with perforation, s/p sigmoid colectomy and end colostomy on 4/1 ?-management per general surgery ?-WOC consulted ?-Continue Zosyn per pharmacy  ?-Follow up with GS if ok to use OGT for meds, currently NPO. ? ?Septic shock, secondary to stercoral ulcer with perforation, could be  medication component post-operatively. No leukocytosis or fever. WBC 8.4>2.5 post op. 1.8L fluid resuscitation.  ?Lactic acidosis, secondary to above ?-Goal MAP 65 or greater. On peripheral neo gtt per Dr. Donne Hazel. Titrate to goal. ?-Zosyn as above ?-Follow up blood cultures ?-Strict I&O ?-Trend lactate ?-Obtain ECHO ?-Trend fever/WBC curve.   ? ?Post operative acute respiratory failure ?s/p sigmoid colectomy and end colostomy on 4/1 ?-LTVV strategy with tidal volumes of 4-8 cc/kg ideal body weight ?-Goal plateau pressures less than 30 and driving pressures less than 15 ?-Wean PEEP/FiO2 for SpO2 92-98% ?-VAP bundle ?-Daily SAT and SBT. Hopefully will be able to extubate in AM. ?-PAD bundle with Propofol gtt and fentanyl push ?-RASS goal 0 to -1 ?-Obtain CXR and ABG ? ?AKI ?Creat 1.77, BUN 33. Baseline 0.7-0.73. Suspect secondary to sepsis ?-Ensure renal perfusion. Goal MAP 65 or greater. ?-Hydration with NS at 160ml/hr ?-Avoid neprotoxic drugs as possible. ?-Strict I&O's ?-Follow up AM creatinine ?-Continue foley ? ?HX HTN ?Currently hypotensive ?-holding all home antihypertensives at this time ? ?Hypothyroidism ?-Resume home synthroid when able to give meds per tube ? ?Dementia and frailty ?-[place] conversation in AM ? ?Best Practice (right click and "Reselect all SmartList Selections" daily)  ? ?Diet/type: NPO ?DVT prophylaxis: prophylactic heparin  ?GI prophylaxis: PPI ?Lines: N/A ?Foley:  Yes, and it is still needed ?Code Status:  full code ?Last date of multidisciplinary goals of care discussion [Pending] ? ?Labs   ?CBC: ?Recent Labs  ?Lab 01/15/22 ?1704  ?WBC 8.4  ?NEUTROABS 6.6  ?HGB 14.2  ?HCT 44.7  ?MCV 94.5  ?PLT 172  ? ? ?Basic Metabolic Panel: ?Recent Labs  ?Lab 01/15/22 ?1704  ?NA 137  ?K 4.3  ?CL 106  ?CO2 15*  ?GLUCOSE 177*  ?BUN 33*  ?CREATININE 1.77*  ?CALCIUM 8.6*  ? ?GFR: ?Estimated Creatinine Clearance: 19.9 mL/min (A) (by C-G formula based on SCr of 1.77 mg/dL (H)). ?Recent Labs  ?Lab 01/15/22 ?1704 01/15/22 ?1930  ?WBC 8.4  --   ?LATICACIDVEN  --  3.3*  ? ? ?Liver Function Tests: ?Recent Labs  ?Lab 01/15/22 ?1704  ?AST 28  ?ALT 13  ?ALKPHOS 62  ?BILITOT 1.0  ?PROT 6.2*  ?ALBUMIN 3.4*  ? ?Recent Labs  ?Lab 01/15/22 ?1704  ?LIPASE 37  ? ?No results for input(s): AMMONIA in the last 168 hours. ? ?ABG ?No results found for: PHART, PCO2ART,  PO2ART, HCO3, TCO2, ACIDBASEDEF, O2SAT  ? ?Coagulation Profile: ?No results for input(s): INR, PROTIME in the last 168 hours. ? ?Cardiac Enzymes: ?No results for input(s): CKTOTAL, CKMB, CKMBINDEX, TROPONINI in the last 168 hours. ? ?HbA1C: ?No results found for: HGBA1C ? ?CBG: ?No results for input(s): GLUCAP in the last 168 hours. ? ?Review of Systems:   ?Unable to obatain ROS due to patient status ? ?Past Medical History:  ?She,  has a past medical history of Hypertension, Hypertensive retinopathy, and Macular degeneration.  ? ?Surgical History:  ? ?Past Surgical History:  ?Procedure Laterality Date  ? CATARACT EXTRACTION Bilateral   ? EYE SURGERY    ? YAG LASER APPLICATION Right   ?  ? ?Social History:  ? reports that she has never smoked. She has never used smokeless tobacco. She reports current alcohol use of about 2.0 standard drinks per week. She reports that she does not use drugs.  ? ?Family History:  ?Her family history is not on file.  ? ?Allergies ?No Known Allergies  ? ?Home Medications  ?Prior to Admission medications   ?Medication Sig  Start Date End Date Taking? Authorizing Provider  ?acetaminophen (TYLENOL) 500 MG tablet Take 500 mg by mouth every 6 (six) hours as needed for mild pain, fever or headache.   Yes [provider]  ?aluminum-magnesium hydroxide-simethicone (MAALOX) [id number] MG/5ML SUSP Take 30 mLs by mouth every 6 (six) hours as needed (heartburn/indigestion).   Yes [provider]  ?bisacodyl (DULCOLAX) 10 MG suppository Place 10 mg rectally See admin instructions. 1 suppository every 3 days as needed for constipation   Yes [provider]  ?busPIRone (BUSPAR) 7.5 MG tablet Take 7.5 mg by mouth 2 (two) times daily.  04/25/19  Yes [provider]  ?guaiFENesin (ROBITUSSIN) 100 MG/5ML liquid Take 10 mLs by mouth every 6 (six) hours as needed for cough or to loosen phlegm.   Yes [provider]  ?levothyroxine (SYNTHROID) 75 MCG tablet Take 75  mcg by mouth daily. 09/27/19  Yes [provider]  ?lisinopril (ZESTRIL) 10 MG tablet Take 10 mg by mouth daily.    Yes [provider]  ?loperamide (IMODIUM) 2 MG capsule Take 2 mg b

## 2022-01-16 NOTE — Transfer of Care (Signed)
Immediate Anesthesia Transfer of Care Note ? ?Patient: Alexis Cortez ? ?Procedure(s) Performed: SIGMOID COLECTOMY, COLOSTOMY (Abdomen) ? ?Patient Location: ICU ? ?Anesthesia Type:General ? ?Level of Consciousness: Patient remains intubated per anesthesia plan ? ?Airway & Oxygen Therapy: Patient remains intubated per anesthesia plan and Patient placed on Ventilator (see vital sign flow sheet for setting) ? ?Post-op Assessment: Report given to RN and Post -op Vital signs reviewed and stable ? ?Post vital signs: Reviewed and stable ? ?Last Vitals:  ?Vitals Value Taken Time  ?BP    ?Temp    ?Pulse    ?Resp    ?SpO2    ? ? ?Last Pain:  ?Vitals:  ? 01/15/22 1936  ?TempSrc:   ?PainSc: 8   ?   ? ?  ? ?Complications: No notable events documented. ?

## 2022-01-16 NOTE — Anesthesia Procedure Notes (Signed)
Procedure Name: Intubation ?Date/Time: 01/16/2022 2:49 AM ?Performed by: Clovis Cao, CRNA ?Pre-anesthesia Checklist: Patient identified, Emergency Drugs available, Suction available and Patient being monitored ?Patient Re-evaluated:Patient Re-evaluated prior to induction ?Oxygen Delivery Method: Circle system utilized ?Preoxygenation: Pre-oxygenation with 100% oxygen ?Induction Type: IV induction, Rapid sequence and Cricoid Pressure applied ?Laryngoscope Size: Sabra Heck and 2 ?Grade View: Grade III ?Tube type: Oral ?Tube size: 8.0 mm ?Number of attempts: 2 (DL x 1 by CRNA- grade 3/4 view with esophageal intubation recognized with two 200 ml breaths. Cridoid pressure maintained. DL: x 1 by CRNA- grade 3 view and successful intubation. Oropharynx clear. teeth intact.) ?Airway Equipment and Method: Stylet and Oral airway ?Placement Confirmation: ETT inserted through vocal cords under direct vision, positive ETCO2 and breath sounds checked- equal and bilateral ?Secured at: 22 cm ?Tube secured with: Tape ?Dental Injury: Teeth and Oropharynx as per pre-operative assessment  ? ? ? ? ?

## 2022-01-16 NOTE — Progress Notes (Signed)
? ?NAME:  Alexis Cortez, MRN:  [id number], DOB:  10-18-1931, LOS: 1 ?ADMISSION DATE:  01/15/2022, CONSULTATION DATE:  4/1 ?REFERRING MD:  Donne Hazel, CHIEF COMPLAINT:  Post op resp failure  ? ?History of Present Illness:  ?Patient is intubated. Therefore history has been obtained from chart review.  ? ?Alexis Cortez, is a 88 y.o. 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  ? ?ED course was notable for CT abdomen and pelvis with moderate pneumoperitoneum, mild sigmoid diverticulosis.  General surgery was consulted and admitted the patient.  She was started on Zosyn.  Overnight the patient became tachycardic and hypotensive.  The patient was taken to the OR.  Patient was found to have a Stercoral ulcer with perforation and underwent a sigmoid colectomy and end colostomy.  She remained intubated postoperatively. ? ?PCCM was consulted for ICU and ventilator management. ? ? ? ?Pertinent  Medical History  ?dementia, hypothyroidism, HTN, resident of assisted living.  ? ?Significant Hospital Events: ?Including procedures, antibiotic start and stop dates in addition to other pertinent events   ?3/31 presented with Abd pain, CT abdomen and pelvis with moderate pneumoperitoneum, mild sigmoid diverticulosis, GS consult ?4/1 sigmoid colectomy and end colostomy, remained  intubated post op  ? ? ?Scheduled Meds: ? chlorhexidine gluconate (MEDLINE KIT)  15 mL Mouth Rinse BID  ? Chlorhexidine Gluconate Cloth  6 each Topical Q0600  ? docusate  100 mg Per Tube BID  ? heparin  5,000 Units Subcutaneous Q8H  ? mouth rinse  15 mL Mouth Rinse 10 times per day  ? pantoprazole (PROTONIX) IV  40 mg Intravenous QHS  ? polyethylene glycol  17 g Per Tube Daily  ? ?Continuous Infusions: ? sodium chloride Stopped (01/16/22 0914)  ? sodium chloride    ?  acetaminophen Stopped (01/16/22 0517)  ? norepinephrine (LEVOPHED) Adult infusion    ? phenylephrine (NEO-SYNEPHRINE) Adult infusion 120 mcg/min (01/16/22 1326)  ? piperacillin-tazobactam (ZOSYN)  IV 12.5 mL/hr at 01/16/22 1000  ? propofol (DIPRIVAN) infusion Stopped (01/16/22 0543)  ?  sodium bicarbonate (isotonic) infusion in sterile water 75 mL/hr at 01/16/22 1000  ? ?PRN Meds:.acetaminophen **OR** acetaminophen, fentaNYL (SUBLIMAZE) injection, fentaNYL (SUBLIMAZE) injection, HYDROmorphone (DILAUDID) injection, ondansetron **OR** ondansetron (ZOFRAN) IV, ondansetron **OR** ondansetron (ZOFRAN) IV, perflutren lipid microspheres (DEFINITY) IV suspension  ?Interim History / Subjective:  ?Sedated on vent breathing 4 breaths above  ? ? ?Objective   ?Blood pressure (!) 101/52, pulse (!) 145, temperature (!) 97.3 ?F (36.3 ?C), temperature source Axillary, resp. rate 18, height $RemoveBe'5\' 2"'WTayxVBXh$  (1.575 m), weight 68 kg, SpO2 97 %. ?   ?Vent Mode: PRVC ?FiO2 (%):  [60 %-100 %] 60 % ?Set Rate:  [18 bmp] 18 bmp ?Vt Set:  [400 mL] 400 mL ?PEEP:  [5 cmH20] 5 cmH20 ?Plateau Pressure:  [14 cmH20] 14 cmH20  ? ?Intake/Output Summary (Last 24 hours) at 01/16/2022 0607 ?Last data filed at 01/16/2022 0500 ?Gross per 24 hour  ?Intake 1908.67 ml  ?Output 540 ml  ?Net 1368.67 ml  ? ?Filed Weights  ? 01/15/22 1617  ?Weight: 68 kg  ? ? ?Examination: ?Tmax 98.3 ?Pt  sedated on vent / breathing 4 above back up rate  ?No jvd ?Oropharynx  ET/og in place  ?Neck supple ?Lungs with a  min exp > insp rhonchi bilaterally/ secretions minimal  ?  RRR no s3 or or significant  murmur ?Abd post op abd with very lilmited excursion   ?Ext warm with no edema   ?Neuro sedated   ?  ? ? ?I personally reviewed images and agree with radiology impression as follows:  ?CXR:   portable 5 am 4/1  ?Poor  aeration both bases atx vs effusions R > L ? ?Resolved Hospital Problem list   ? ? ?Assessment & Plan:  ?Stercoral ulcer with perforation, s/p sigmoid colectomy and end colostomy  on 4/1 ?-management per general surgery ?-WOC consulted ?-Continue Zosyn per pharmacy  ?-Follow up with GS if ok to use OGT for meds, currently NPO. ? ?Septic shock, secondary to stercoral ulcer with perforation, high risk abd 3rd spacing ?Lactic acidosis, secondary to above ?- Zosyn 3/31 >>> ?>>> Goal MAP 65 or greater. On peripheral neo gtt per Dr. Donne Hazel. Titrate to goal. ?>>> Follow up blood cultures ? >>> Trend lactate ? >>>  ECHO  4/1 >>> ?  ? ?Post operative acute respiratory failure ?s/p sigmoid colectomy and end colostomy on 4/1 ?-LTVV strategy with tidal volumes of 4-8 cc/kg ideal body weight ?-Goal plateau pressures less than 30 and driving pressures less than 15 ?-Wean PEEP/FiO2 for SpO2 92-98% ?-VAP bundle ?-Daily SAT and SBT  ?-PAD bundle with Propofol gtt and fentanyl push ?-RASS goal 0 to -1 ?  ? ?AKI on ACEi prior to admit  ?Creat 1.77, BUN 33. Baseline 0.7-0.73. Suspect secondary to sepsis om setting of ACEi still on board\ ?Lab Results  ?Component Value Date  ? CREATININE 1.82 (H) 01/16/2022  ? CREATININE 1.77 (H) 01/15/2022  ? CREATININE 0.73 10/10/2019  ?-Ensure renal perfusion. Goal MAP 65 or greater. ?-Hydration with NS at 171ml/hr ?-Avoid neprotoxic drugs as possible. ?-Strict I&O's ?-Follow up AM creatinine ?-Continue foley ? ?HX HTN on ACEi prior to admit ?Currently hypotensive ?-holding all home antihypertensives at this time noting acei effects are still lingering  and prevent the kidney from maintaining GFR in the setting of compromised RBF back they lead to dilation of both the afferent and the efferent [place] arteries  ? ?Hypothyroidism ?-Resume home synthroid when able to give meds per tube ? ?Dementia and frailty ?-[place] conversation with son at bedside.  She had been physically frail with tendency to fall for about a year so limited to walking with walker PTA ?Best Practice (right click and "Reselect all SmartList Selections" daily)  ? ?Diet/type: NPO ?DVT prophylaxis: prophylactic  heparin  ?GI prophylaxis: PPI ?Lines: N/A ?Foley:  Yes, and it is still needed ?Code Status:  full code ?Last date of multidisciplinary goals of care discussion [Pending] ? ?Labs   ?CBC: ?Recent Labs  ?Lab 01/15/22 ?1704 01/16/22 ?8786 01/16/22 ?7672  ?WBC 8.4 2.5*  --   ?NEUTROABS 6.6  --   --   ?HGB 14.2 14.3 14.3  ?HCT 44.7 45.0 42.0  ?MCV 94.5 93.8  --   ?PLT 172 222  --   ? ? ?Basic Metabolic Panel: ?Recent Labs  ?Lab 01/15/22 ?1704 01/16/22 ?0947 01/16/22 ?0962  ?NA 137 139 141  ?K 4.3 3.5 3.8  ?CL 106 118*  --   ?CO2 15* 12*  --   ?GLUCOSE 177* 180*  --   ?BUN 33* 32*  --   ?CREATININE 1.77* 1.82*  --   ?CALCIUM 8.6* 6.9*  --   ? ?GFR: ?Estimated Creatinine Clearance: 19.3 mL/min (A) (by C-G formula based on SCr of 1.82 mg/dL (H)). ?Recent Labs  ?Lab 01/15/22 ?1704  01/15/22 ?1930 01/16/22 ?6725  ?WBC 8.4  --  2.5*  ?LATICACIDVEN  --  3.3* 4.1*  ? ? ?Liver Function Tests: ?Recent Labs  ?Lab 01/15/22 ?1704  ?AST 28  ?ALT 13  ?ALKPHOS 62  ?BILITOT 1.0  ?PROT 6.2*  ?ALBUMIN 3.4*  ? ?Recent Labs  ?Lab 01/15/22 ?1704  ?LIPASE 37  ? ?No results for input(s): AMMONIA in the last 168 hours. ? ?ABG ?   ?Component Value Date/Time  ? PHART 7.234 (L) 01/16/2022 0449  ? PCO2ART 30.0 (L) 01/16/2022 0449  ? PO2ART 112 (H) 01/16/2022 0449  ? HCO3 12.8 (L) 01/16/2022 0449  ? TCO2 14 (L) 01/16/2022 0449  ? ACIDBASEDEF 14.0 (H) 01/16/2022 0449  ? O2SAT 98 01/16/2022 0449  ?  ? ?Coagulation Profile: ?No results for input(s): INR, PROTIME in the last 168 hours. ? ?Cardiac Enzymes: ?No results for input(s): CKTOTAL, CKMB, CKMBINDEX, TROPONINI in the last 168 hours. ? ?HbA1C: ?No results found for: HGBA1C ? ?CBG: ?No results for input(s): GLUCAP in the last 168 hours. ?  ? ?The patient is critically ill with multiple organ systems failure and requires high complexity decision making for assessment and support, frequent evaluation and titration of therapies, application of advanced monitoring technologies and extensive interpretation  of multiple databases. Critical Care Time devoted to patient care services described in this note is 60 min  minutes. ? ? ?Christinia Gully, MD ?Pulmonary and Critical Care Medicine ?[place] ?C

## 2022-01-16 NOTE — Progress Notes (Signed)
?  Echocardiogram ?2D Echocardiogram has been performed. ? ?Alexis Cortez ?01/16/2022, 1:20 PM ?

## 2022-01-16 NOTE — ED Notes (Signed)
Patient seen by Dr Dwain Sarna for decreasing blood pressure and increasing heart rate . NS bolus as verbally ordered by MD Dwain Sarna . Told patient will be going to the OR for immediate surgery. Patients son Trey Paula made aware.  ?

## 2022-01-16 NOTE — Progress Notes (Signed)
Patient ID: Alexis Cortez, female   DOB: 27-Feb-1931, 88 y.o.   MRN: [id number] ?Patient with more abdominal pain, bp lower, heart rate up also. I think at this point only choice is to go to OR. I discussed this with her and she actually is more awake and does state she is agreeable to go to OR for sigmoid colectomy and colostomy.  I discussed with her son Trey Paula over the phone and he agrees. Will proceed asap ?

## 2022-01-16 NOTE — Progress Notes (Signed)
eLink Physician-Brief Progress Note ?Patient Name: ASHA GRUMBINE ?DOB: [birth year] ?MRN: [id number] ? ? ?Date of Service ? 01/16/2022  ?HPI/Events of Note ? ABG 7.23/30/112 on TV 400, rate 18, PEEP 5.  Lactate 4.1, crea 1.82, serum HCO3 12.   ?eICU Interventions ? Continue pressors to keep MAP >65. ?Change IVFs to HCO3 in sterile water @ 75cc/hr.   ? ? ? ?Intervention Category ?Intermediate Interventions: Diagnostic test evaluation ? ?Larinda Buttery ?01/16/2022, 5:20 AM ?

## 2022-01-16 NOTE — Progress Notes (Signed)
Radiology called about abdominal xray results. Per report NG tube is looped in the distal esophagus with the tip directed cranially but not included on the film. Dr Dwain Sarna aware that NG tube was probably malpositioned. NG clamped, and staff aware NOT to use ?

## 2022-01-16 NOTE — Progress Notes (Signed)
eLink Physician-Brief Progress Note ?Patient Name: Alexis Cortez ?DOB: 1931-06-11 ?MRN: [id number] ? ? ?Date of Service ? 01/16/2022  ?HPI/Events of Note ? Notified of CXR report saying that NGT is malpositioned, with tip remaining in the esophagus. Pt not receiveing tube feeds at this time.  ?Will have feeding tube removed for now.   ?eICU Interventions ? Pt also increasingly agitated, pulling at at lines.  ?Attempts to reorient her have been unsuccessful.  ?Placed order for bilateral wrist restraints.  ?Will continue to monitor closely. ?Plan to discontinue restraints when no longer indicated.    ? ? ? ?  ? ?[place] ?01/16/2022, 8:41 PM ?

## 2022-01-16 NOTE — Anesthesia Preprocedure Evaluation (Signed)
Anesthesia Evaluation  ?Patient identified by MRN, date of birth, ID band ?Patient confused ? ? ? ?Reviewed: ?Allergy & Precautions, H&P , NPO status , Patient's Chart, lab work & pertinent test results ? ?Airway ?Mallampati: III ? ? ?Neck ROM: limited ? ? ? Dental ?  ?Pulmonary ?neg pulmonary ROS,  ?  ?breath sounds clear to auscultation ? ? ? ? ? ? Cardiovascular ?hypertension,  ?Rhythm:regular Rate:Normal ? ? ?  ?Neuro/Psych ?PSYCHIATRIC DISORDERS Dementia   ? GI/Hepatic ?PUD, Free air in abdomen ?  ?Endo/Other  ? ? Renal/GU ?ARFRenal diseaseCr 1.77  ? ?  ?Musculoskeletal ? ? Abdominal ?  ?Peds ? Hematology ?  ?Anesthesia Other Findings ? ? Reproductive/Obstetrics ? ?  ? ? ? ? ? ? ? ? ? ? ? ? ? ?  ?  ? ? ? ? ? ? ? ? ?Anesthesia Physical ?Anesthesia Plan ? ?ASA: 3 and emergent ? ?Anesthesia Plan: General  ? ?Post-op Pain Management:   ? ?Induction: Intravenous ? ?PONV Risk Score and Plan: 3 and Ondansetron, Dexamethasone and Treatment may vary due to age or medical condition ? ?Airway Management Planned: Oral ETT ? ?Additional Equipment: Arterial line ? ?Intra-op Plan:  ? ?Post-operative Plan: Post-operative intubation/ventilation ? ?Informed Consent: I have reviewed the patients History and Physical, chart, labs and discussed the procedure including the risks, benefits and alternatives for the proposed anesthesia with the patient or authorized representative who has indicated his/her understanding and acceptance.  ? ? ? ?Dental advisory given ? ?Plan Discussed with: CRNA, Anesthesiologist and Surgeon ? ?Anesthesia Plan Comments:   ? ? ? ? ? ? ?Anesthesia Quick Evaluation ? ?

## 2022-01-16 NOTE — Procedures (Signed)
Central Venous Catheter Insertion Procedure Note ? ?[place]  ?[id number]  ?1931/06/04 ? ?Date:01/16/22  ?Time:11:17 PM  ? ?Provider Performing:Zenaida Tesar R Marcelline Temkin  ? ?Procedure: Insertion of Non-tunneled Central Venous Catheter(36556) without US guidance ? ?Indication(s) ?Medication administration ? ?Consent ?Risks of the procedure as well as the alternatives and risks of each were explained to the patient and/or caregiver.  Consent for the procedure was obtained and is signed in the bedside chart ? ?Anesthesia ?Topical only with 1% lidocaine  ? ?Timeout ?Verified patient identification, verified procedure, site/side was marked, verified correct patient position, special equipment/implants available, medications/allergies/relevant history reviewed, required imaging and test results available. ? ?Sterile Technique ?Maximal sterile technique including full sterile barrier drape, hand hygiene, sterile gown, sterile gloves, mask, hair covering, sterile ultrasound probe cover (if used). ? ?Procedure Description ?Area of catheter insertion was cleaned with chlorhexidine and draped in sterile fashion.  A central venous catheter - triple lumen was placed into the left subclavian vein on first attempt. Nonpulsatile blood flow and easy flushing noted in all ports.  The catheter was sutured in place and sterile dressing applied. ? ?Complications/Tolerance ?None; patient tolerated the procedure well. ?Chest X-ray is ordered to verify placement for internal jugular or subclavian cannulation.   Chest x-ray is not ordered for femoral cannulation. ? ?EBL ?Minimal ? ?Specimen(s) ?None ? ?

## 2022-01-16 NOTE — Progress Notes (Signed)
An USGPIV (ultrasound guided PIV) has been placed for short-term vasopressor infusion. A correctly placed ivWatch must be used when administering Vasopressors. Should this treatment be needed beyond 72 hours, central line access should be obtained.  It will be the responsibility of the bedside nurse to follow best practice to prevent extravasations.   ?

## 2022-01-16 NOTE — Progress Notes (Signed)
Pt arrived from OR intubated and sedated with 1 pt belonging bag. Pt had 2 black socks, 1 black pair of pants and 1 red/pink-ish top. No personal valuables noted in bag. ?

## 2022-01-17 ENCOUNTER — Inpatient Hospital Stay (HOSPITAL_COMMUNITY): Payer: Medicare PPO

## 2022-01-17 DIAGNOSIS — K668 Other specified disorders of peritoneum: Secondary | ICD-10-CM | POA: Diagnosis not present

## 2022-01-17 DIAGNOSIS — N179 Acute kidney failure, unspecified: Secondary | ICD-10-CM | POA: Diagnosis not present

## 2022-01-17 DIAGNOSIS — A419 Sepsis, unspecified organism: Secondary | ICD-10-CM | POA: Diagnosis not present

## 2022-01-17 DIAGNOSIS — J9600 Acute respiratory failure, unspecified whether with hypoxia or hypercapnia: Secondary | ICD-10-CM | POA: Diagnosis not present

## 2022-01-17 LAB — BLOOD CULTURE ID PANEL (REFLEXED) - BCID2

## 2022-01-17 LAB — POCT I-STAT 7, (LYTES, BLD GAS, ICA,H+H)
Acid-base deficit: 3 mmol/L — ABNORMAL HIGH (ref 0.0–2.0)
Bicarbonate: 19 mmol/L — ABNORMAL LOW (ref 20.0–28.0)
Calcium, Ion: 0.93 mmol/L — ABNORMAL LOW (ref 1.15–1.40)
HCT: 40 % (ref 36.0–46.0)
Hemoglobin: 13.6 g/dL (ref 12.0–15.0)
O2 Saturation: 99 %
Patient temperature: 98.6
Potassium: 4 mmol/L (ref 3.5–5.1)
Sodium: 134 mmol/L — ABNORMAL LOW (ref 135–145)
TCO2: 20 mmol/L — ABNORMAL LOW (ref 22–32)
pCO2 arterial: 25.9 mmHg — ABNORMAL LOW (ref 32–48)
pH, Arterial: 7.473 — ABNORMAL HIGH (ref 7.35–7.45)
pO2, Arterial: 121 mmHg — ABNORMAL HIGH (ref 83–108)

## 2022-01-17 LAB — CBC
HCT: 42.3 % (ref 36.0–46.0)
Hemoglobin: 14.2 g/dL (ref 12.0–15.0)
MCH: 30 pg (ref 26.0–34.0)
MCHC: 33.6 g/dL (ref 30.0–36.0)
MCV: 89.4 fL (ref 80.0–100.0)
Platelets: 196 10*3/uL (ref 150–400)
RBC: 4.73 MIL/uL (ref 3.87–5.11)
RDW: 13.8 % (ref 11.5–15.5)
WBC: 11.7 10*3/uL — ABNORMAL HIGH (ref 4.0–10.5)
nRBC: 0 % (ref 0.0–0.2)

## 2022-01-17 LAB — BASIC METABOLIC PANEL
Anion gap: 13 (ref 5–15)
BUN: 36 mg/dL — ABNORMAL HIGH (ref 8–23)
CO2: 15 mmol/L — ABNORMAL LOW (ref 22–32)
Calcium: 6 mg/dL — CL (ref 8.9–10.3)
Chloride: 107 mmol/L (ref 98–111)
Creatinine, Ser: 2.18 mg/dL — ABNORMAL HIGH (ref 0.44–1.00)
GFR, Estimated: 21 mL/min — ABNORMAL LOW (ref >60)
Glucose, Bld: 140 mg/dL — ABNORMAL HIGH (ref 70–99)
Potassium: 4.1 mmol/L (ref 3.5–5.1)
Sodium: 135 mmol/L (ref 135–145)

## 2022-01-17 LAB — GLUCOSE, CAPILLARY
Glucose-Capillary: 106 mg/dL — ABNORMAL HIGH (ref 70–99)
Glucose-Capillary: 121 mg/dL — ABNORMAL HIGH (ref 70–99)
Glucose-Capillary: 114 mg/dL — ABNORMAL HIGH (ref 70–99)
Glucose-Capillary: 91 mg/dL (ref 70–99)
Glucose-Capillary: 84 mg/dL (ref 70–99)

## 2022-01-17 LAB — TRIGLYCERIDES: Triglycerides: 51 mg/dL (ref <150)

## 2022-01-17 MED ORDER — FENTANYL 2500MCG IN NS 250ML (10MCG/ML) PREMIX INFUSION
0.0000 ug/h | INTRAVENOUS | Status: DC
  Administered 2022-01-17: 25 ug/h via INTRAVENOUS
  Filled 2022-01-17: qty 250

## 2022-01-17 MED ORDER — NOREPINEPHRINE 4 MG/250ML-% IV SOLN
0.0000 ug/min | INTRAVENOUS | Status: DC
  Administered 2022-01-17: 22 ug/min via INTRAVENOUS
  Administered 2022-01-17: 14 ug/min via INTRAVENOUS
  Administered 2022-01-17: 21 ug/min via INTRAVENOUS
  Administered 2022-01-17: 28 ug/min via INTRAVENOUS
  Administered 2022-01-17: 10 ug/min via INTRAVENOUS
  Administered 2022-01-17: 24 ug/min via INTRAVENOUS
  Administered 2022-01-17: 20 ug/min via INTRAVENOUS
  Administered 2022-01-17: 26 ug/min via INTRAVENOUS
  Administered 2022-01-18: 14 ug/min via INTRAVENOUS
  Administered 2022-01-18: 13 ug/min via INTRAVENOUS
  Filled 2022-01-17 (×10): qty 250

## 2022-01-17 MED ORDER — PIPERACILLIN-TAZOBACTAM IN DEX 2-0.25 GM/50ML IV SOLN
2.2500 g | Freq: Three times a day (TID) | INTRAVENOUS | Status: DC
  Administered 2022-01-17 – 2022-01-19 (×6): 2.25 g via INTRAVENOUS
  Filled 2022-01-17 (×7): qty 50

## 2022-01-17 MED ORDER — CALCIUM GLUCONATE-NACL 2-0.675 GM/100ML-% IV SOLN
2.0000 g | Freq: Once | INTRAVENOUS | Status: AC
  Administered 2022-01-17: 2000 mg via INTRAVENOUS
  Filled 2022-01-17: qty 100

## 2022-01-17 MED ORDER — ACETAMINOPHEN 10 MG/ML IV SOLN
1000.0000 mg | Freq: Four times a day (QID) | INTRAVENOUS | Status: AC
  Administered 2022-01-17 – 2022-01-18 (×4): 1000 mg via INTRAVENOUS
  Filled 2022-01-17 (×5): qty 100

## 2022-01-17 MED ORDER — LACTATED RINGERS IV SOLN: INTRAVENOUS | Status: DC

## 2022-01-17 NOTE — Progress Notes (Signed)
? ?NAME:  Alexis Cortez, MRN:  [id number], DOB:  Feb 21, 1931, LOS: 2 ?ADMISSION DATE:  01/15/2022, CONSULTATION DATE:  4/1 ?REFERRING MD:  Donne Hazel, CHIEF COMPLAINT:  Post op resp failure  ? ?History of Present Illness:  ?Patient is intubated. Therefore history has been obtained from chart review.  ? ?Alexis Cortez, is a 88 y.o. 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  ? ?ED course was notable for CT abdomen and pelvis with moderate pneumoperitoneum, mild sigmoid diverticulosis.  General surgery was consulted and admitted the patient.  She was started on Zosyn.  Overnight the patient became tachycardic and hypotensive.  The patient was taken to the OR.  Patient was found to have a Stercoral ulcer with perforation and underwent a sigmoid colectomy and end colostomy.  She remained intubated postoperatively. ? ?PCCM was consulted for ICU and ventilator management. ? ? ?Pertinent  Medical History  ?dementia, hypothyroidism, HTN, resident of assisted living.  ? ?Significant Hospital Events: ?Including procedures, antibiotic start and stop dates in addition to other pertinent events   ?3/31 presented with Abd pain, CT abdomen and pelvis with moderate pneumoperitoneum, mild sigmoid diverticulosis, GS consult ?4/1 sigmoid colectomy and end colostomy, remained  intubated post op  ?4/1 L Subclavian ? ? ?Scheduled Meds: ? chlorhexidine gluconate (MEDLINE KIT)  15 mL Mouth Rinse BID  ? Chlorhexidine Gluconate Cloth  6 each Topical Q0600  ? heparin  5,000 Units Subcutaneous Q8H  ? mouth rinse  15 mL Mouth Rinse 10 times per day  ? pantoprazole (PROTONIX) IV  40 mg Intravenous QHS  ? ?Continuous Infusions: ? sodium chloride 10 mL/hr at 01/17/22 1300  ? sodium chloride    ? acetaminophen Stopped (01/17/22 1227)  ? fentaNYL infusion  INTRAVENOUS 50 mcg/hr (01/17/22 1300)  ? norepinephrine (LEVOPHED) Adult infusion 22 mcg/min (01/17/22 1300)  ? phenylephrine (NEO-SYNEPHRINE) Adult infusion Stopped (01/17/22 0054)  ? piperacillin-tazobactam (ZOSYN)  IV 2.25 g (01/17/22 1415)  ? propofol (DIPRIVAN) infusion Stopped (01/16/22 0543)  ?  sodium bicarbonate (isotonic) infusion in sterile water 75 mL/hr at 01/17/22 1413  ? ?PRN Meds:.acetaminophen **OR** acetaminophen, fentaNYL (SUBLIMAZE) injection, fentaNYL (SUBLIMAZE) injection, HYDROmorphone (DILAUDID) injection, ondansetron **OR** ondansetron (ZOFRAN) IV, ondansetron **OR** ondansetron (ZOFRAN) IV  ? ? ?Interim History / Subjective:  ?More alert this am / now on HC03 drip ? ? ?Objective   ?Blood pressure 94/75, pulse 98, temperature 98.6 ?F (37 ?C), temperature source Oral, resp. rate 18, height _0  (1.575 m), weight 68 kg, SpO2 100 %. ?CVP:  [6 mmHg-10 mmHg] 10 mmHg  ?Vent Mode: PSV;CPAP ?FiO2 (%):  [40 %-50 %] 40 % ?Set Rate:  [18 bmp] 18 bmp ?Vt Set:  [400 mL] 400 mL ?PEEP:  [5 cmH20] 5 cmH20 ?Pressure Support:  [5 cmH20] 5 cmH20 ?Plateau Pressure:  [10 cmH20-20 cmH20] 14 cmH20  ? ?Intake/Output Summary (Last 24 hours) at 01/17/2022 1434 ?Last data filed at 01/17/2022 1300 ?Gross per 24 hour  ?Intake 5443.91 ml  ?Output 960 ml  ?Net 4483.91 ml  ? ?Filed Weights  ? 01/15/22 1617  ?Weight: 68 kg  ? ?CVP:  [6 mmHg-10 mmHg] 10 mmHg  ? ?Examination: ?Tmax:  100.4  ?Pt  sedated on vent / breathing just a few breaths above back up rate  ?No jvd ?Oropharynx  ET in place ?Neck supple ?  Lungs with a  scattered exp > insp rhonchi bilaterally/ secretions min ?RRR no s3 or or significant  murmur ?Abd soft though with limited excursion /  could not pass 0G to stomach so d/c'd  ?Ext warm with no edema   ?Neuro not f/c yet but appears more alert   ? ? ?I personally reviewed images and agree with radiology impression as follows:  ?CXR:   portable 4/2 ?Ok aeration bilaterally  ? ? ?Resolved Hospital Problem list    ? ? ?Assessment & Plan:  ?Stercoral ulcer with perforation, s/p sigmoid colectomy and end colostomy on 4/1 ?-management per general surgery ?-WOC consulted ?-Continue Zosyn per pharmacy  ?  ? ?Septic shock, secondary to stercoral ulcer with perforation, high risk abd 3rd spacing ?Lactic acidosis, secondary to above ?- Zosyn 3/31 >>> ?>>> Goal MAP 65 or greater.  ?>>> MRSA PCR neg / RVP neg >>> Follow up blood cultures  ? >>>  ECHO  4/1 :  TDS but EF mod decreased, RV severely decreased  ?  ? ?Post operative acute respiratory failure ?s/p sigmoid colectomy and end colostomy on 4/1 ?-LTVV strategy with tidal volumes of 4-8 cc/kg ideal body weight ?-Goal plateau pressures less than 30 and driving pressures less than 15 ?-Wean PEEP/FiO2 for SpO2 92-98% ?-VAP bundle ?-Daily SAT and SBT  ?-PAD bundle with Propofol gtt and fentanyl push ?>> letting her wake up today and note she is actually over ventilating so try on PSV and extubate?  ?  ? ?AKI on ACEi prior to admit  ?Creat 1.77, BUN 33. Baseline 0.7-0.73. Suspect secondary to sepsis om setting of ACEi still on board\ ?Lab Results  ?Component Value Date  ? CREATININE 2.18 (H) 01/17/2022  ? CREATININE 1.82 (H) 01/16/2022  ? CREATININE 1.77 (H) 01/15/2022  ?>>> bicarb drip at 75 cc/ h/ euvolemic by cvp today  ?>>> abg's c/w over ventilation, not primary metabalic acidosis ? ?HX HTN on ACEi prior to admit/ still on levophed as of am 4/2  ?-holding all home antihypertensives at this time noting acei effects are still lingering  and prevent the kidney from maintaining GFR in the setting of compromised RBF back they lead to dilation of both the afferent and the efferent Rental arteries  ? ?Hypothyroidism ?-Resume home synthroid when able to give meds per tube ? ?Dementia and frailty ?-Chamita conversation with son at bedside.  She had been physically frail with tendency to fall for about a year so limited to walking with walker PTA and may wish to limit an escalation of care   ? ? ?Best Practice (right click and "Reselect all SmartList Selections" daily)  ? ?Diet/type: NPO ?DVT prophylaxis: prophylactic heparin  ?GI prophylaxis: PPI ?Lines: N/A ?Foley:  Yes, and it is still needed ?Code Status:  full code ?Last date of multidisciplinary goals of care discussion [Pending] ? ?Labs   ?CBC: ?Recent Labs  ?Lab 01/15/22 ?1704 01/16/22 ?1657 01/16/22 ?9038 01/17/22 ?3338 01/17/22 ?1132  ?WBC 8.4 2.5*  --  11.7*  --   ?NEUTROABS 6.6  --   --   --   --   ?HGB 14.2 14.3 14.3 14.2 13.6  ?HCT 44.7 45.0 42.0 42.3 40.0  ?MCV 94.5 93.8  --  89.4  --   ?PLT 172 222  --  196  --   ? ? ?Basic Metabolic Panel: ?Recent Labs  ?Lab 01/15/22 ?1704 01/16/22 ?3291 01/16/22 ?9166 01/17/22 ?0600 01/17/22 ?1132  ?NA 137 139 141 135 134*  ?K 4.3  3.5 3.8 4.1 4.0  ?CL 106 118*  --  107  --   ?CO2 15* 12*  --  15*  --   ?GLUCOSE 177* 180*  --  140*  --   ?BUN 33* 32*  --  36*  --   ?CREATININE 1.77* 1.82*  --  2.18*  --   ?CALCIUM 8.6* 6.9*  --  6.0*  --   ?MG  --  1.8  --   --   --   ?PHOS  --  3.0  --   --   --   ? ?GFR: ?Estimated Creatinine Clearance: 16.1 mL/min (A) (by C-G formula based on SCr of 2.18 mg/dL (H)). ?Recent Labs  ?Lab 01/15/22 ?1704 01/15/22 ?1930 01/16/22 ?6002 01/17/22 ?0323  ?WBC 8.4  --  2.5* 11.7*  ?LATICACIDVEN  --  3.3* 4.1*  --   ? ? ?Liver Function Tests: ?Recent Labs  ?Lab 01/15/22 ?1704  ?AST 28  ?ALT 13  ?ALKPHOS 62  ?BILITOT 1.0  ?PROT 6.2*  ?ALBUMIN 3.4*  ? ?Recent Labs  ?Lab 01/15/22 ?1704  ?LIPASE 37  ? ?No results for input(s): AMMONIA in the last 168 hours. ? ?ABG ?   ?Component Value Date/Time  ? PHART 7.473 (H) 01/17/2022 1132  ? PCO2ART 25.9 (L) 01/17/2022 1132  ? PO2ART 121 (H) 01/17/2022 1132  ? HCO3 19.0 (L) 01/17/2022 1132  ? TCO2 20 (L) 01/17/2022 1132  ? ACIDBASEDEF 3.0 (H) 01/17/2022 1132  ? O2SAT 99 01/17/2022 1132  ?  ? ?Coagulation Profile: ?No results for input(s): INR, PROTIME in the last 168 hours. ? ?Cardiac Enzymes: ?No results for input(s): CKTOTAL, CKMB,  CKMBINDEX, TROPONINI in the last 168 hours. ? ?HbA1C: ?No results found for: HGBA1C ? ?CBG: ?Recent Labs  ?Lab 01/16/22 ?2055 01/16/22 ?2347 01/16/22 ?2351 01/17/22 ?9847 01/17/22 ?1128  ?GLUCAP 106* <10* 94 121* 1

## 2022-01-17 NOTE — Progress Notes (Signed)
? ? ?1 Day Post-Op  ?Subjective: ?Patient awake on the vent.  On fentanyl gtt for pain control.  Still hypotensive this am on neo ? ?ROS: See above, otherwise other systems negative ? ?Objective: ?Vital signs in last 24 hours: ?Temp:  [97.3 ?F (36.3 ?C)-100.4 ?F (38 ?C)] 100.4 ?F (38 ?C) (04/02 0750) ?Pulse Rate:  [100-202] 104 (04/02 0739) ?Resp:  [0-35] 18 (04/02 0739) ?BP: (86-138)/(40-84) 90/45 (04/02 0739) ?SpO2:  [83 %-100 %] 100 % (04/02 0739) ?Arterial Line BP: (73-130)/(45-78) 103/45 (04/02 0600) ?FiO2 (%):  [40 %-50 %] 40 % (04/02 0739) ?Last BM Date :  (PTA) ? ?Intake/Output from previous day: ?04/01 0701 - 04/02 0700 ?In: 5409.2 [I.V.:4873; IV Piggyback:536.2] ?Out: [id number] [Urine:270; Drains:462] ?Intake/Output this shift: ?No intake/output data recorded. ? ?PE: ?Heart: regular ?Lungs: on vent ?Abd: soft, appropriately tender, colostomy with viable stoma, no output yet.  Midline wound is clean and packed.  Packing removed and wound intact.  JP drain with evidence of previous oozing, but stopped.  JP with serosang output.  OG unable to be advanced past GE junction. ? ?Lab Results:  ?Recent Labs  ?  01/16/22 ?[id number] 01/16/22 ?[id number] 01/17/22 ?0323  ?WBC 2.5*  --  11.7*  ?HGB 14.3 14.3 14.2  ?HCT 45.0 42.0 42.3  ?PLT 222  --  196  ? ?BMET ?Recent Labs  ?  01/16/22 ?[id number] 01/16/22 ?[id number] 01/17/22 ?0323  ?NA 139 141 135  ?K 3.5 3.8 4.1  ?CL 118*  --  107  ?CO2 12*  --  15*  ?GLUCOSE 180*  --  140*  ?BUN 32*  --  36*  ?CREATININE 1.82*  --  2.18*  ?CALCIUM 6.9*  --  6.0*  ? ?PT/INR ?No results for input(s): LABPROT, INR in the last 72 hours. ?CMP  ?   ?Component Value Date/Time  ? NA 135 01/17/2022 0323  ? K 4.1 01/17/2022 0323  ? CL 107 01/17/2022 0323  ? CO2 15 (L) 01/17/2022 0323  ? GLUCOSE 140 (H) 01/17/2022 0323  ? BUN 36 (H) 01/17/2022 0323  ? CREATININE 2.18 (H) 01/17/2022 0323  ? CALCIUM 6.0 (LL) 01/17/2022 0323  ? PROT 6.2 (L) 01/15/2022 1704  ? ALBUMIN 3.4 (L) 01/15/2022 1704  ? AST 28 01/15/2022 1704  ? ALT  13 01/15/2022 1704  ? ALKPHOS 62 01/15/2022 1704  ? BILITOT 1.0 01/15/2022 1704  ? GFRNONAA 21 (L) 01/17/2022 0323  ? GFRAA >60 10/10/2019 0307  ? ?Lipase  ?   ?Component Value Date/Time  ? LIPASE 37 01/15/2022 1704  ? ? ? ? ? ?Studies/Results: ?CT ABDOMEN PELVIS WO CONTRAST ? ?Result Date: 01/15/2022 ?CLINICAL DATA:  Left lower quadrant abdominal pain. EXAM: CT ABDOMEN AND PELVIS WITHOUT CONTRAST TECHNIQUE: Multidetector CT imaging of the abdomen and pelvis was performed following the standard protocol without IV contrast. RADIATION DOSE REDUCTION: This exam was performed according to the departmental dose-optimization program which includes automated exposure control, adjustment of the mA and/or kV according to patient size and/or use of iterative reconstruction technique. COMPARISON:  AP chest 10/05/2019 FINDINGS: Lower chest: Small right and possible trace left pleural effusions. Moderate right and mild left associated curvilinear ground-glass posterior lung base densities. Mild cardiomegaly. Mild pericardial fluid. Unfortunately this study is mildly to moderately limited by patient motion artifact. Lack of intra-articular fluid further limits evaluation of the abdominal and pelvic organ parenchyma. The following findings are made within this limitation. Hepatobiliary: Smooth liver contours. No gross liver lesion is seen. The gallbladder is grossly  unremarkable. Pancreas: There is moderate atrophy and fatty infiltration of the pancreas within normal limits for patient age. Spleen: Normal in size without focal abnormality. Adrenals/Urinary Tract: Normal adrenals. No renal stone or hydronephrosis. There is a cyst that is medial to the inferior pole of the right kidney that appears to represent a parapelvic cyst measuring up to 2.3 cm. Within limitations of streak artifact from bilateral total hip arthroplasty hardware no gross abnormality is seen within the urinary bladder. Stomach/Bowel: Moderate stool is seen  within the rectum. There is moderate sigmoid diverticulosis. High-grade stool throughout all segments of the colon. There is air within the lumen around high-grade stool within the proximal sigmoid colon (axial images 42 through 54) and moderate to high-grade air also seen surrounding the colon in this region suspicious for pneumatosis and microperforation. There is also more distant ventral peritoneal free air. The terminal ileum is unremarkable. The appendix is not confidently identified, however no definite inflammatory changes are seen around the cecum to indicate secondary signs of acute appendicitis. Mild-to-moderate sliding hiatal hernia. Mild thickening of the wall of the second and third portions of the duodenum may be secondary to inflammatory change versus underdistention. Vascular/Lymphatic: High-grade atherosclerotic calcifications. No abdominal aortic aneurysm. No abdominopelvic lymphadenopathy. Reproductive: Not well evaluated due to lack of IV contrast, motion artifact and metallic streak artifact. Other: No free fluid is seen. There is moderate pneumoperitoneum seen ventral to the right upper quadrant left upper quadrant and midline posterior abdomen. There are scattered gas bubbles anterior to the ileum in the superior right hemipelvis (axial image 70). A small focus of air extends into the right inguinal canal (axial image 83). 1 there is also an apparent fat containing midline ventral abdominal hernia with orifice measuring up to 1.7 x 1.1 cm (transverse by AP, axial image 44 and sagittal image 96) which contains mild ventral herniated fat and a small amount of air. Musculoskeletal: Moderate to severe levocurvature centered at L2-3 with severe multilevel degenerative disc and endplate changes. Chronic mild-to-moderate L1 vertebral body height loss. Status post bilateral total hip arthroplasty. IMPRESSION: 1. There is moderate pneumoperitoneum. There is mild sigmoid diverticulosis with air  tracking circumferentially around the proximal sigmoid colon wall concerning for wall micro rupture in this region. There appears to be extra colonic free air around an approximate 7 cm length of the sigmoid colon and there may be tiny foci of pneumatosis in this region however that is indeterminate. 2. There is high-grade stool throughout the colon and the rectum. 3. Small right and possible trace left pleural effusions with associated mild bibasilar lung opacities. 4. Mild thickening of the wall of the second and third portions of the duodenum may be secondary to inflammatory change versus underdistention. Critical Value/emergent results were called by telephone at the time of interpretation on 01/15/2022 at 6:57 PM to provider JULIE HAVILAND , who verbally acknowledged these results. Electronically Signed   By: Yvonne Kendall M.D.   On: 01/15/2022 19:02  ? ?DG Abd 1 View ? ?Result Date: 01/17/2022 ?CLINICAL DATA:  OG tube placement EXAM: ABDOMEN - 1 VIEW COMPARISON:  None. FINDINGS: NG tube loops on itself in the distal esophagus. The tip is in the mid to distal thoracic esophagus. IMPRESSION: As above. Electronically Signed   By: Rolm Baptise M.D.   On: 01/17/2022 00:17  ? ?DG CHEST PORT 1 VIEW ? ?Result Date: 01/17/2022 ?CLINICAL DATA:  OG tube and central line placement EXAM: PORTABLE CHEST 1 VIEW COMPARISON:  None. FINDINGS:  Endotracheal tube has been retracted, now 4.5 cm from the carina. Left subclavian central line tip at at the cavoatrial junction. No pneumothorax. NG tube folded on itself twice passing as far as the distal esophagus. The tip is in the midthoracic esophagus. This likely needs be removed completely and replaced to reduce the loops in the NG tube. Bibasilar atelectasis. No effusions. Heart is normal size. No acute bony abnormality. IMPRESSION: NG tube loops twice on itself in the esophagus with the tip in the midthoracic esophagus. This likely needs to be removed completely to reduce the loops,  then replaced. Left central line tip at the cavoatrial junction.  No pneumothorax. Bibasilar atelectasis. Electronically Signed   By: Rolm Baptise M.D.   On: 01/17/2022 00:16  ? ?DG CHEST PORT 1 VIEW ? ?Result

## 2022-01-17 NOTE — Anesthesia Postprocedure Evaluation (Addendum)
Anesthesia Post Note ? ?Patient: Alexis Cortez ? ?Procedure(s) Performed: SIGMOID COLECTOMY (Abdomen) ?COLOSTOMY (Abdomen) ? ?  ? ?Patient location during evaluation: SICU ?Anesthesia Type: General ?Level of consciousness: sedated ?Pain management: pain level controlled ?Vital Signs Assessment: post-procedure vital signs reviewed and stable ?Respiratory status: patient remains intubated per anesthesia plan ?Cardiovascular status: stable ?Postop Assessment: no apparent nausea or vomiting ?Anesthetic complications: no ? ? ?No notable events documented. ? ?Last Vitals:  ?Vitals:  ? 01/17/22 1745 01/17/22 1800  ?BP:    ?Pulse: 98 97  ?Resp: 17 (!) 0  ?Temp:    ?SpO2: 100% 100%  ?  ?Last Pain:  ?Vitals:  ? 01/17/22 1550  ?TempSrc: Oral  ?PainSc:   ? ? ?  ?  ?  ?  ?  ?  ? ?Tanush Drees S ? ? ? ? ?

## 2022-01-17 NOTE — Progress Notes (Signed)
PHARMACY - PHYSICIAN COMMUNICATION ?CRITICAL VALUE ALERT - BLOOD CULTURE IDENTIFICATION (BCID) ? ?Alexis Cortez is an 88 y.o. female who presented to [facility] on 01/15/2022 with a chief complaint of abdominal pain; s/I sigmoid colectomy and end colostomy ? ?Assessment:  1 bloof culture with gram variable rods in the anaerobic bottle likely from intra-abdominal source ? ?Name of physician (or Provider) Contacted: Dr/ Rosendo Gros ? ?Current antibiotics: Zosyn ? ?Changes to prescribed antibiotics recommended:  ?Patient is on recommended antibiotics - No changes needed ? ?Results for orders placed or performed during the hospital encounter of 01/15/22  ?Blood Culture ID Panel (Reflexed) (Collected: 01/15/2022  7:38 PM)  ?Result Value Ref Range  ? Enterococcus faecalis NOT DETECTED NOT DETECTED  ? Enterococcus Faecium NOT DETECTED NOT DETECTED  ? Listeria monocytogenes NOT DETECTED NOT DETECTED  ? Staphylococcus species NOT DETECTED NOT DETECTED  ? Staphylococcus aureus (BCID) NOT DETECTED NOT DETECTED  ? Staphylococcus epidermidis NOT DETECTED NOT DETECTED  ? Staphylococcus lugdunensis NOT DETECTED NOT DETECTED  ? Streptococcus species NOT DETECTED NOT DETECTED  ? Streptococcus agalactiae NOT DETECTED NOT DETECTED  ? Streptococcus pneumoniae NOT DETECTED NOT DETECTED  ? Streptococcus pyogenes NOT DETECTED NOT DETECTED  ? A.calcoaceticus-baumannii NOT DETECTED NOT DETECTED  ? Bacteroides fragilis NOT DETECTED NOT DETECTED  ? Enterobacterales NOT DETECTED NOT DETECTED  ? Enterobacter cloacae complex NOT DETECTED NOT DETECTED  ? Escherichia coli NOT DETECTED NOT DETECTED  ? Klebsiella aerogenes NOT DETECTED NOT DETECTED  ? Klebsiella oxytoca NOT DETECTED NOT DETECTED  ? Klebsiella pneumoniae NOT DETECTED NOT DETECTED  ? Proteus species NOT DETECTED NOT DETECTED  ? Salmonella species NOT DETECTED NOT DETECTED  ? Serratia marcescens NOT DETECTED NOT DETECTED  ? Haemophilus influenzae NOT DETECTED NOT DETECTED  ? Neisseria  meningitidis NOT DETECTED NOT DETECTED  ? Pseudomonas aeruginosa NOT DETECTED NOT DETECTED  ? Stenotrophomonas maltophilia NOT DETECTED NOT DETECTED  ? Candida albicans NOT DETECTED NOT DETECTED  ? Candida auris NOT DETECTED NOT DETECTED  ? Candida glabrata NOT DETECTED NOT DETECTED  ? Candida krusei NOT DETECTED NOT DETECTED  ? Candida parapsilosis NOT DETECTED NOT DETECTED  ? Candida tropicalis NOT DETECTED NOT DETECTED  ? Cryptococcus neoformans/gattii NOT DETECTED NOT DETECTED  ? ? ?[name] ?01/17/2022  2:36 PM ? ?

## 2022-01-17 NOTE — Progress Notes (Signed)
PHARMACY NOTE:  ANTIMICROBIAL RENAL DOSAGE ADJUSTMENT ? ?Current antimicrobial regimen includes a mismatch between antimicrobial dosage and estimated renal function.  As per policy approved by the Pharmacy & Therapeutics and Medical Executive Committees, the antimicrobial dosage will be adjusted accordingly. ? ?Current antimicrobial dosage:  Zosyn 2.25 q6hr ? ?Indication: intra-abdominal infection ? ?Renal Function: ? ?Estimated Creatinine Clearance: 16.1 mL/min (A) (by C-G formula based on SCr of 2.18 mg/dL (H)). ? ?Antimicrobial dosage has been changed to:  Zosyn 2.26 gm q8hr ? ? ?Thank you for allowing pharmacy to be a part of this patient's care. ? ?Tera Mater, RPH ?01/17/2022 7:25 AM ?

## 2022-01-17 NOTE — TOC Initial Note (Signed)
Transition of Care (TOC) - Initial/Assessment Note  ? ? ?Patient Details  ?Name: Alexis Cortez ?MRN: [id number] ?Date of Birth: [birth year] ? ?Transition of Care (TOC) CM/SW Contact:    ?Deatra Robinson, LCSW ?Phone Number: ?01/17/2022, 10:08 AM ? ?Clinical Narrative:   SW spoke with pt's son, Trey Paula [phone], who confirmed pt admitted from [facility]. Son aware pt will likely need SNF at dc and is agreeable. Reviewed SNF placement process and answered questions. Pt with previous SNF stay at [facility] in 2021, son prefers a different facility if possible.  ? ?Pt currently not appropriate for SNF, remains intubated/sedated. SW will follow for PT/OT recs and assist as indicated.   ? ?Dellie Burns, MSW, LCSW ?[phone] (coverage) ? ?           ? ? ?Expected Discharge Plan: Skilled Nursing Facility ?Barriers to Discharge: Continued Medical Work up ? ? ?Patient Goals and CMS Choice ?  ?  ?  ? ?Expected Discharge Plan and Services ?Expected Discharge Plan: Skilled Nursing Facility ?  ?  ?  ?Living arrangements for the past 2 months: Assisted Living Facility ?                ?  ?  ?  ?  ?  ?  ?  ?  ?  ?  ? ?Prior Living Arrangements/Services ?Living arrangements for the past 2 months: Assisted Living Facility ?Lives with:: Facility Resident ?  ?       ?Need for Family Participation in Patient Care: Yes (Comment) ?Care giver support system in place?: Yes (comment) ?  ?Criminal Activity/Legal Involvement Pertinent to Current Situation/Hospitalization: No - Comment as needed ? ?Activities of Daily Living ?  ?  ? ?Permission Sought/Granted ?Permission sought to share information with : Magazine features editor ?Permission granted to share information with : Yes, Verbal Permission Granted ?   ?   ?   ?   ? ?Emotional Assessment ?  ?  ?  ?Orientation: : Fluctuating Orientation (Suspected and/or reported Sundowners) ?Alcohol / Substance Use: Not Applicable ?Psych Involvement: No (comment) ? ?Admission  diagnosis:  Pneumoperitoneum [K66.8] ?Generalized abdominal pain [R10.84] ?Perforated ulcer (HCC) [K27.5] ?Constipation, unspecified constipation type [K59.00] ?Patient Active Problem List  ? Diagnosis Date Noted  ? Perforated ulcer (HCC) 01/16/2022  ? Septic shock (HCC)   ? AKI (acute kidney injury) (HCC)   ? Acute respiratory failure (HCC)   ? Pneumoperitoneum 01/15/2022  ? Sinus bradycardia   ? COVID-19 virus infection 10/05/2019  ? ?PCP:  April Manson, NP ?Pharmacy:   ?[id number] - [place], [place] - [address] ?[address] ?[place] [address] ?Phone: [phone] Fax: [phone] ? ?[facility] - [place], [place] - [address] ?[address] ?[place] [address] ?Phone: [phone] Fax: [phone] ? ? ? ? ?Social Determinants of Health (SDOH) Interventions ?  ? ?Readmission Risk Interventions ?   ? View : No data to display.  ?  ?  ?  ? ? ? ?

## 2022-01-17 NOTE — Progress Notes (Addendum)
eLink Physician-Brief Progress Note ?Patient Name: Alexis Cortez ?DOB: Jan 29, 1931 ?MRN: [id number] ? ? ?Date of Service ? 01/17/2022  ?HPI/Events of Note ? Camera eval for restraints renewal done. In synchrony with vent.   ?eICU Interventions ? Bilateral, non violent soft wrist restriants ordered to prevent self injury and harm from pullineg lines/tubes.  ?  ? ? ? ?Intervention Category ?Minor Interventions: Agitation / anxiety - evaluation and management ? ?Ranee Gosselin ?01/17/2022, 8:02 PM ? ?6:46 ?Calcium 1 gm ordered for low level. ?

## 2022-01-17 NOTE — Consult Note (Signed)
WOC Nurse ostomy consult note: POD 1 ?Consult received post creation of end colostomy by Dr. Dwain Sarna. WOC Nursing to see on Monday, 4/2. ? ?WOC nursing team will follow for post operative management and education. ? ?Thank you, ?Ladona Mow, MSN, RN, GNP, CWOCN, CWON-AP, FAAN  ?Pager# [phone]  ?

## 2022-01-17 NOTE — Progress Notes (Signed)
eLink Physician-Brief Progress Note ?Patient Name: Alexis Cortez ?DOB: [birth year] ?MRN: [id number] ? ? ?Date of Service ? 01/17/2022  ?HPI/Events of Note ? Pt again becoming increasingly restless.  ?Has been off propofol, and has been sedated with fentanyl PRN.   ?eICU Interventions ? Given increasing requirement for fentanyl, will start on fentanyl drip for pain control and sedation.   ? ? ? ?  ? ?[name] M DELA CRUZ ?01/17/2022, 4:15 AM ?

## 2022-01-18 ENCOUNTER — Encounter (HOSPITAL_COMMUNITY): Payer: Self-pay | Admitting: General Surgery

## 2022-01-18 DIAGNOSIS — K275 Chronic or unspecified peptic ulcer, site unspecified, with perforation: Secondary | ICD-10-CM | POA: Diagnosis not present

## 2022-01-18 DIAGNOSIS — J9600 Acute respiratory failure, unspecified whether with hypoxia or hypercapnia: Secondary | ICD-10-CM | POA: Diagnosis not present

## 2022-01-18 DIAGNOSIS — K668 Other specified disorders of peritoneum: Secondary | ICD-10-CM | POA: Diagnosis not present

## 2022-01-18 DIAGNOSIS — N179 Acute kidney failure, unspecified: Secondary | ICD-10-CM | POA: Diagnosis not present

## 2022-01-18 LAB — GLUCOSE, CAPILLARY
Glucose-Capillary: 101 mg/dL — ABNORMAL HIGH (ref 70–99)
Glucose-Capillary: 141 mg/dL — ABNORMAL HIGH (ref 70–99)
Glucose-Capillary: 53 mg/dL — ABNORMAL LOW (ref 70–99)
Glucose-Capillary: 72 mg/dL (ref 70–99)
Glucose-Capillary: 75 mg/dL (ref 70–99)
Glucose-Capillary: 76 mg/dL (ref 70–99)
Glucose-Capillary: 76 mg/dL (ref 70–99)
Glucose-Capillary: 77 mg/dL (ref 70–99)
Glucose-Capillary: 96 mg/dL (ref 70–99)

## 2022-01-18 LAB — BASIC METABOLIC PANEL
Anion gap: 10 (ref 5–15)
BUN: 32 mg/dL — ABNORMAL HIGH (ref 8–23)
CO2: 21 mmol/L — ABNORMAL LOW (ref 22–32)
Calcium: 6.3 mg/dL — CL (ref 8.9–10.3)
Chloride: 100 mmol/L (ref 98–111)
Creatinine, Ser: 1.95 mg/dL — ABNORMAL HIGH (ref 0.44–1.00)
GFR, Estimated: 24 mL/min — ABNORMAL LOW (ref >60)
Glucose, Bld: 78 mg/dL (ref 70–99)
Potassium: 3.9 mmol/L (ref 3.5–5.1)
Sodium: 131 mmol/L — ABNORMAL LOW (ref 135–145)

## 2022-01-18 LAB — CBC
HCT: 33.3 % — ABNORMAL LOW (ref 36.0–46.0)
Hemoglobin: 11.3 g/dL — ABNORMAL LOW (ref 12.0–15.0)
MCH: 29.8 pg (ref 26.0–34.0)
MCHC: 33.9 g/dL (ref 30.0–36.0)
MCV: 87.9 fL (ref 80.0–100.0)
Platelets: 154 10*3/uL (ref 150–400)
RBC: 3.79 MIL/uL — ABNORMAL LOW (ref 3.87–5.11)
RDW: 14.1 % (ref 11.5–15.5)
WBC: 8.4 10*3/uL (ref 4.0–10.5)
nRBC: 0 % (ref 0.0–0.2)

## 2022-01-18 LAB — POCT I-STAT 7, (LYTES, BLD GAS, ICA,H+H)
Acid-base deficit: 1 mmol/L (ref 0.0–2.0)
Bicarbonate: 21.9 mmol/L (ref 20.0–28.0)
Calcium, Ion: 0.96 mmol/L — ABNORMAL LOW (ref 1.15–1.40)
HCT: 33 % — ABNORMAL LOW (ref 36.0–46.0)
Hemoglobin: 11.2 g/dL — ABNORMAL LOW (ref 12.0–15.0)
O2 Saturation: 100 %
Patient temperature: 99.3
Potassium: 3.7 mmol/L (ref 3.5–5.1)
Sodium: 130 mmol/L — ABNORMAL LOW (ref 135–145)
TCO2: 23 mmol/L (ref 22–32)
pCO2 arterial: 31.5 mmHg — ABNORMAL LOW (ref 32–48)
pH, Arterial: 7.451 — ABNORMAL HIGH (ref 7.35–7.45)
pO2, Arterial: 180 mmHg — ABNORMAL HIGH (ref 83–108)

## 2022-01-18 MED ORDER — DEXTROSE 10 % IV SOLN: INTRAVENOUS | Status: DC

## 2022-01-18 MED ORDER — DEXTROSE 50 % IV SOLN
INTRAVENOUS | Status: AC
Start: 2022-01-18 — End: 2022-01-18
  Filled 2022-01-18: qty 50

## 2022-01-18 MED ORDER — CALCIUM GLUCONATE-NACL 1-0.675 GM/50ML-% IV SOLN
1.0000 g | Freq: Once | INTRAVENOUS | Status: AC
  Administered 2022-01-18: 1000 mg via INTRAVENOUS
  Filled 2022-01-18: qty 50

## 2022-01-18 MED ORDER — ACETAMINOPHEN 10 MG/ML IV SOLN
1000.0000 mg | Freq: Four times a day (QID) | INTRAVENOUS | Status: DC
  Administered 2022-01-18 (×2): 1000 mg via INTRAVENOUS
  Filled 2022-01-18 (×4): qty 100

## 2022-01-18 MED ORDER — ORAL CARE MOUTH RINSE
15.0000 mL | Freq: Four times a day (QID) | OROMUCOSAL | Status: DC
  Administered 2022-01-18 – 2022-01-27 (×23): 15 mL via OROMUCOSAL

## 2022-01-18 MED ORDER — DEXTROSE 50 % IV SOLN
25.0000 g | INTRAVENOUS | Status: AC
  Administered 2022-01-18: 25 g via INTRAVENOUS

## 2022-01-18 NOTE — Progress Notes (Signed)
eLink Physician-Brief Progress Note ?Patient Name: Alexis Cortez ?DOB: [birth year] ?MRN: [id number] ? ? ?Date of Service ? 01/18/2022  ?HPI/Events of Note ? Hypoglycemia - Blood glucose = 53. Nursing unable to place gastric tube. Already given D50.  ?eICU Interventions ? PLan: ?D10W IV infusion at 40 mL/hour. ?Decrease LR IV infusion to 60 mL/hour.  ? ? ? ?Intervention Category ?Major Interventions: Other: ? ?Verlinda Slotnick Dennard Nip ?01/18/2022, 9:17 PM ?

## 2022-01-18 NOTE — Progress Notes (Signed)
Critical Value: Calcium 6.3 ? ?Date and time results received: 01/18/22 6:26 AM  ? ?Name of Provider Notified: elink ? ?Orders Received? Or Actions Taken? See orders  ?

## 2022-01-18 NOTE — Consult Note (Signed)
WOC Nurse ostomy follow up ?Patient receiving care in [facility] 2M10. ?Stoma type/location: LUQ colostomy ?Stomal assessment/size: deferred, no output yet, supplies had to be ordered and obtained ?Peristomal assessment: deferred ?Treatment options for stomal/peristomal skin: barrier ring ?Output: none ?Ostomy pouching: Currently has on a 2 pc flat system. I have ordered a one piece convex with barrier ring. Gigi Gin #[id number]; barrier Rogue Bussing [phone] ?Education provided: none ?Enrolled patient in [place] Secure Start Discharge program: No  ?WOC will see Tuesday for further ostomy assessment and pouch change. ?Helmut Muster, RN, MSN, CWOCN, CNS-BC, pager [phone]  ?

## 2022-01-18 NOTE — Progress Notes (Signed)
? ?NAME:  Alexis Cortez, MRN:  [id number], DOB:  27-Jun-1931, LOS: 3 ?ADMISSION DATE:  01/15/2022, CONSULTATION DATE:  4/1 ?REFERRING MD:  Donne Hazel, CHIEF COMPLAINT:  Post op resp failure  ? ?History of Present Illness:  ?Patient is intubated. Therefore history has been obtained from chart review.  ? ?Alexis Cortez, is a 88 y.o. female, never smoker/ retired band Art therapist, who presented to the [facility] ED 4pm 3/31 via EMS with a chief complaint of lower abdominal pain with a pertinent past medical history of dementia, hypothyroidism, HTN, resident of assisted living.  ? ?ED course was notable for CT abdomen and pelvis with moderate pneumoperitoneum, mild sigmoid diverticulosis.  General surgery was consulted and admitted the patient.  She was started on Zosyn.  Overnight the patient became tachycardic and hypotensive.  The patient was taken to the OR.  Patient was found to have a Stercoral ulcer with perforation and underwent a sigmoid colectomy and end colostomy.  She remained intubated postoperatively. ? ?PCCM was consulted for ICU and ventilator management. ? ? ?Pertinent  Medical History  ?dementia, hypothyroidism, HTN, resident of assisted living.  ? ?Significant Hospital Events: ?Including procedures, antibiotic start and stop dates in addition to other pertinent events   ?3/31 presented with Abd pain, CT abdomen and pelvis with moderate pneumoperitoneum, mild sigmoid diverticulosis, GS consult ?4/1 sigmoid colectomy and end colostomy, remained  intubated post op  ?4/1 L Subclavian ? ? ?Scheduled Meds: ? chlorhexidine gluconate (MEDLINE KIT)  15 mL Mouth Rinse BID  ? Chlorhexidine Gluconate Cloth  6 each Topical Q0600  ? heparin  5,000 Units Subcutaneous Q8H  ? mouth rinse  15 mL Mouth Rinse QID  ? ?Continuous Infusions: ? sodium chloride 10 mL/hr at 01/18/22 1600  ? sodium chloride    ? acetaminophen Stopped (01/18/22 1251)  ? calcium gluconate    ? lactated ringers 100 mL/hr at 01/18/22 1600  ? norepinephrine  (LEVOPHED) Adult infusion 5 mcg/min (01/18/22 1600)  ? phenylephrine (NEO-SYNEPHRINE) Adult infusion Stopped (01/17/22 0054)  ? piperacillin-tazobactam (ZOSYN)  IV Stopped (01/18/22 1514)  ? ?Interim History / Subjective:  ? ?Patient is tolerating spontaneous breathing trial ?She still remains on low-dose vasopressors to maintain map goal 65 ? ?Objective   ?Blood pressure (!) 130/52, pulse (!) 55, temperature 97.6 ?F (36.4 ?C), temperature source Oral, resp. rate 13, height 5' 2"  (1.575 m), weight 68 kg, SpO2 100 %. ?CVP:  [5 mmHg-12 mmHg] 12 mmHg  ?Vent Mode: CPAP;PSV ?FiO2 (%):  [40 %] 40 % ?Set Rate:  [14 bmp-18 bmp] 14 bmp ?Vt Set:  [400 mL] 400 mL ?PEEP:  [5 cmH20] 5 cmH20 ?Pressure Support:  [8 cmH20-10 cmH20] 8 cmH20 ?Plateau Pressure:  [8 cmH20-14 cmH20] 14 cmH20  ? ?Intake/Output Summary (Last 24 hours) at 01/18/2022 1702 ?Last data filed at 01/18/2022 1600 ?Gross per 24 hour  ?Intake 4279.92 ml  ?Output 1420 ml  ?Net 2859.92 ml  ? ?Filed Weights  ? 01/15/22 1617  ?Weight: 68 kg  ? ?  ?Physical exam: ?General: Crtitically ill-appearing female, orally intubated ?HEENT: [place]/AT, eyes anicteric.  ETT and OGT in place ?Neuro: Awake, following commands, moving all 4 extremities ?Chest: Coarse breath sounds, no wheezes or rhonchi ?Heart: Regular rate and rhythm, no murmurs or gallops ?Abdomen: Soft, nontender, nondistended, bowel sounds present ?Skin: No rash ? ?Resolved Hospital Problem list   ? ? ?Assessment & Plan:  ?Stercoral ulcer with perforation, s/p sigmoid colectomy and end colostomy on 4/1 ?Management per general surgery ?Wound  care consulted ?Continue IV Zosyn ?  ?Septic shock, secondary to stercoral ulcer with perforation leading to acute peritonitis ?Blood cultures growing gram variable rods ?Continue IV antibiotics with Zosyn ?Continue IV vasopressor with map goal 65 ? ?Chronic biventricular heart failure ?Monitor intake and output ? ?Acute hypoxic respiratory failure with septic shock ?s/p sigmoid  colectomy and end colostomy on 4/1 ?Continue lung protective ventilation ?Patient is tolerating splinting breathing trial, will try to extubate ?Watch for respiratory distress ?VAP bundle ? ?AKI on ACEi prior to admit  ?Baseline serum creatinine is around 0.7 ?Now serum creatinine elevated likely in the setting of ischemic ATN caused by septic shock ?Continue IV fluid resuscitation ?Avoid volume overload ?Monitor intake and output ?Avoid nephrotoxic agents  ? ?Hypocalcemia ?Continue aggressive electrolyte supplement and monitor ? ?Hypothyroidism ?Continue Synthyroid ?Able to take p.o. ? ?Dementia and frailty ?[place] conversation with son at bedside.  She had been physically frail with tendency to fall for about a year so limited to walking with walker PTA and may wish to limit an escalation of care  ? ? ?Best Practice (right click and "Reselect all SmartList Selections" daily)  ? ?Diet/type: NPO ?DVT prophylaxis: prophylactic heparin  ?GI prophylaxis: PPI ?Lines: N/A ?Foley:  Yes, and it is still needed ?Code Status:  full code ?Last date of multidisciplinary goals of care discussion [Pending] ? ?Labs   ?CBC: ?Recent Labs  ?Lab 01/15/22 ?1704 01/16/22 ?3329 01/16/22 ?5188 01/17/22 ?0323 01/17/22 ?1132 01/18/22 ?4166 01/18/22 ?0934  ?WBC 8.4 2.5*  --  11.7*  --  8.4  --   ?NEUTROABS 6.6  --   --   --   --   --   --   ?HGB 14.2 14.3 14.3 14.2 13.6 11.3* 11.2*  ?HCT 44.7 45.0 42.0 42.3 40.0 33.3* 33.0*  ?MCV 94.5 93.8  --  89.4  --  87.9  --   ?PLT 172 222  --  196  --  154  --   ? ? ?Basic Metabolic Panel: ?Recent Labs  ?Lab 01/15/22 ?1704 01/16/22 ?0630 01/16/22 ?1601 01/17/22 ?0323 01/17/22 ?1132 01/18/22 ?0932 01/18/22 ?0934  ?NA 137 139 141 135 134* 131* 130*  ?K 4.3 3.5 3.8 4.1 4.0 3.9 3.7  ?CL 106 118*  --  107  --  100  --   ?CO2 15* 12*  --  15*  --  21*  --   ?GLUCOSE 177* 180*  --  140*  --  78  --   ?BUN 33* 32*  --  36*  --  32*  --   ?CREATININE 1.77* 1.82*  --  2.18*  --  1.95*  --   ?CALCIUM 8.6* 6.9*  --   6.0*  --  6.3*  --   ?MG  --  1.8  --   --   --   --   --   ?PHOS  --  3.0  --   --   --   --   --   ? ?GFR: ?Estimated Creatinine Clearance: 18 mL/min (A) (by C-G formula based on SCr of 1.95 mg/dL (H)). ?Recent Labs  ?Lab 01/15/22 ?1704 01/15/22 ?1930 01/16/22 ?3557 01/17/22 ?3220 01/18/22 ?2542  ?WBC 8.4  --  2.5* 11.7* 8.4  ?LATICACIDVEN  --  3.3* 4.1*  --   --   ? ? ?Liver Function Tests: ?Recent Labs  ?Lab 01/15/22 ?1704  ?AST 28  ?ALT 13  ?ALKPHOS 62  ?BILITOT 1.0  ?PROT 6.2*  ?ALBUMIN 3.4*  ? ?  Recent Labs  ?Lab 01/15/22 ?1704  ?LIPASE 37  ? ?No results for input(s): AMMONIA in the last 168 hours. ? ?ABG ?   ?Component Value Date/Time  ? PHART 7.451 (H) 01/18/2022 0934  ? PCO2ART 31.5 (L) 01/18/2022 0934  ? PO2ART 180 (H) 01/18/2022 0934  ? HCO3 21.9 01/18/2022 0934  ? TCO2 23 01/18/2022 0934  ? ACIDBASEDEF 1.0 01/18/2022 0934  ? O2SAT 100 01/18/2022 0934  ?  ? ?Coagulation Profile: ?No results for input(s): INR, PROTIME in the last 168 hours. ? ?Cardiac Enzymes: ?No results for input(s): CKTOTAL, CKMB, CKMBINDEX, TROPONINI in the last 168 hours. ? ?HbA1C: ?No results found for: HGBA1C ? ?CBG: ?Recent Labs  ?Lab 01/17/22 ?2331 01/18/22 ?2956 01/18/22 ?2130 01/18/22 ?1132 01/18/22 ?1607  ?GLUCAP 76 72 76 77 75  ? ?  ?Total critical care time: 36 minutes ? ?Performed by: Jacky Kindle ?  ?Critical care time was exclusive of separately billable procedures and treating other patients. ?  ?Critical care was necessary to treat or prevent imminent or life-threatening deterioration. ?  ?Critical care was time spent personally by me on the following activities: development of treatment plan with patient and/or surrogate as well as nursing, discussions with consultants, evaluation of patient's response to treatment, examination of patient, obtaining history from patient or surrogate, ordering and performing treatments and interventions, ordering and review of laboratory studies, ordering and review of radiographic  studies, pulse oximetry and re-evaluation of patient's condition. ?  ?Jacky Kindle MD ?[facility] ?See Amion for pager ?If no response to pager, please call [phone] until 7pm ?After 7pm, P

## 2022-01-18 NOTE — Procedures (Signed)
Extubation Procedure Note ? ?Patient Details:   ?Name: Alexis Cortez ?DOB: 22-Oct-1930 ?MRN: [id number] ?  ?Airway Documentation:  ?  ?Vent end date: 01/18/22 Vent end time: 1148  ? ?Evaluation ? O2 sats: stable throughout ?Complications: No apparent complications ?Patient did tolerate procedure well. ?Bilateral Breath Sounds: Clear, Diminished ?  ?Yes ? ? ?Positive cuff leak, Pt able to say name, no stridor noted. Pt on Cos Cob 3L Sats 100%. RT will continue to monitor.  ? ?Rosaura Carpenter ?01/18/2022, 11:49 AM ? ?

## 2022-01-18 NOTE — Progress Notes (Addendum)
? ? ?2 Days Post-Op  ?Subjective: ?Patient awake on the vent.  Denies pain this morning.  Attempting to wean.  Making urine, 1220cc yesterday.  Denies nausea ? ?ROS: unable, on vent ? ?Objective: ?Vital signs in last 24 hours: ?Temp:  [98.4 ?F (36.9 ?C)-99.3 ?F (37.4 ?C)] 98.4 ?F (36.9 ?C) (04/03 0800) ?Pulse Rate:  [66-117] 66 (04/03 0845) ?Resp:  [0-31] 11 (04/03 0845) ?BP: (51-145)/(36-83) 133/55 (04/03 0314) ?SpO2:  [91 %-100 %] 100 % (04/03 0845) ?Arterial Line BP: (87-151)/(45-65) 127/55 (04/03 0845) ?FiO2 (%):  [40 %] 40 % (04/03 0800) ?Last BM Date : 01/17/22 ? ?Intake/Output from previous day: ?04/02 0701 - 04/03 0700 ?In: 4634.5 [I.V.:4130.8; IV Piggyback:503.7] ?Out: 1315 [Urine:1220; Drains:95] ?Intake/Output this shift: ?Total I/O ?In: 259.9 [I.V.:159.9; IV Piggyback:100] ?Out: 220 [Urine:145; Drains:75] ? ?PE: ?Heart: regular ?Lungs: on vent ?Abd: soft, appropriately tender, colostomy with viable stoma, no output yet.  Midline wound is clean and packed.  Packing removed and wound intact.  JP with serosang output, 425cc yesterday.  ?Lab Results:  ?Recent Labs  ?  01/17/22 ?0323 01/17/22 ?1132 01/18/22 ?1610  ?WBC 11.7*  --  8.4  ?HGB 14.2 13.6 11.3*  ?HCT 42.3 40.0 33.3*  ?PLT 196  --  154  ? ?BMET ?Recent Labs  ?  01/17/22 ?0323 01/17/22 ?1132 01/18/22 ?9604  ?NA 135 134* 131*  ?K 4.1 4.0 3.9  ?CL 107  --  100  ?CO2 15*  --  21*  ?GLUCOSE 140*  --  78  ?BUN 36*  --  32*  ?CREATININE 2.18*  --  1.95*  ?CALCIUM 6.0*  --  6.3*  ? ?PT/INR ?No results for input(s): LABPROT, INR in the last 72 hours. ?CMP  ?   ?Component Value Date/Time  ? NA 131 (L) 01/18/2022 0537  ? K 3.9 01/18/2022 0537  ? CL 100 01/18/2022 0537  ? CO2 21 (L) 01/18/2022 0537  ? GLUCOSE 78 01/18/2022 0537  ? BUN 32 (H) 01/18/2022 0537  ? CREATININE 1.95 (H) 01/18/2022 0537  ? CALCIUM 6.3 (LL) 01/18/2022 0537  ? PROT 6.2 (L) 01/15/2022 1704  ? ALBUMIN 3.4 (L) 01/15/2022 1704  ? AST 28 01/15/2022 1704  ? ALT 13 01/15/2022 1704  ? ALKPHOS  62 01/15/2022 1704  ? BILITOT 1.0 01/15/2022 1704  ? GFRNONAA 24 (L) 01/18/2022 0537  ? GFRAA >60 10/10/2019 0307  ? ?Lipase  ?   ?Component Value Date/Time  ? LIPASE 37 01/15/2022 1704  ? ? ? ? ? ?Studies/Results: ?DG Abd 1 View ? ?Result Date: 01/17/2022 ?CLINICAL DATA:  OG tube placement EXAM: ABDOMEN - 1 VIEW COMPARISON:  None. FINDINGS: NG tube loops on itself in the distal esophagus. The tip is in the mid to distal thoracic esophagus. IMPRESSION: As above. Electronically Signed   By: Charlett Nose M.D.   On: 01/17/2022 00:17  ? ?DG CHEST PORT 1 VIEW ? ?Result Date: 01/17/2022 ?CLINICAL DATA:  OG tube and central line placement EXAM: PORTABLE CHEST 1 VIEW COMPARISON:  None. FINDINGS: Endotracheal tube has been retracted, now 4.5 cm from the carina. Left subclavian central line tip at at the cavoatrial junction. No pneumothorax. NG tube folded on itself twice passing as far as the distal esophagus. The tip is in the midthoracic esophagus. This likely needs be removed completely and replaced to reduce the loops in the NG tube. Bibasilar atelectasis. No effusions. Heart is normal size. No acute bony abnormality. IMPRESSION: NG tube loops twice on itself in the esophagus  with the tip in the midthoracic esophagus. This likely needs to be removed completely to reduce the loops, then replaced. Left central line tip at the cavoatrial junction.  No pneumothorax. Bibasilar atelectasis. Electronically Signed   By: Rolm Baptise M.D.   On: 01/17/2022 00:16  ? ?ECHOCARDIOGRAM LIMITED ? ?Result Date: 01/16/2022 ?   ECHOCARDIOGRAM LIMITED REPORT   Patient Name:   Alexis Cortez Date of Exam: 01/16/2022 Medical Rec #:  [id number]      Height:       62.0 in Accession #:    [id number]     Weight:       150.0 lb Date of Birth:  May 08, 1931     BSA:          1.692 m? Patient Age:    88 years       BP:           111/76 mmHg Patient Gender: F              HR:           115 bpm. Exam Location:  Inpatient Procedure: Limited Echo, Cardiac Doppler  and Color Doppler                                MODIFIED REPORT: This report was modified by Dorris Carnes MD on 01/16/2022 due to Change impression.  Indications:     Shock  History:         Patient has no prior history of Echocardiogram examinations.                  Septic shock. AKI. Acute resipratory failure.  Sonographer:     Clayton Lefort RDCS (AE) Referring Phys:  [id number] Estill Cotta Diagnosing Phys: Dorris Carnes MD  Sonographer Comments: Technically challenging study due to limited acoustic windows, Technically difficult study due to poor echo windows, no parasternal window, suboptimal apical window and echo performed with patient supine and on artificial respirator. Image acquisition challenging due to respiratory motion. Extremly challenging acoustic windows. IMPRESSIONS  1. Very limited windows, even with Definity. LVEF is probably mild to moderately decreased. Very diffiicult.  2. RV is dilated and function appears severely down. FINDINGS  Left Ventricle: Very limited windows, even with Definity. LVEF is probably mild to moderately decreased. Very diffiicult. Definity contrast agent was given IV to delineate the left ventricular endocardial borders. Right Ventricle: RV is dilated and function appears severely down. Dorris Carnes MD Electronically signed by Dorris Carnes MD Signature Date/Time: 01/16/2022/1:32:57 PM    Final (Updated)    ? ?Anti-infectives: ?Anti-infectives (From admission, onward)  ? ? Start     Dose/Rate Route Frequency Ordered Stop  ? 01/17/22 1400  piperacillin-tazobactam (ZOSYN) IVPB 2.25 g       ? 2.25 g ?100 mL/hr over 30 Minutes Intravenous Every 8 hours 01/17/22 0725 01/21/22 1359  ? 01/16/22 1800  piperacillin-tazobactam (ZOSYN) IVPB 2.25 g  Status:  Discontinued       ? 2.25 g ?100 mL/hr over 30 Minutes Intravenous Every 6 hours 01/16/22 1420 01/17/22 0725  ? 01/16/22 1000  piperacillin-tazobactam (ZOSYN) IVPB 3.375 g  Status:  Discontinued       ? 3.375 g ?12.5 mL/hr over 240 Minutes  Intravenous Every 12 hours 01/15/22 2129 01/16/22 1420  ? 01/15/22 1945  piperacillin-tazobactam (ZOSYN) IVPB 3.375 g       ? 3.375 g ?100 mL/hr  over 30 Minutes Intravenous  Once 01/15/22 1939 01/15/22 2045  ? ?  ? ? ? ?Assessment/Plan ?POD 2, s/p  ex lap with Hartmann's procedure by Dr. Donne Hazel 01/16/22 for stercoral ulcer perforation ?-unfortunately NG/OG unable to advance past GE junction ?-no output yet from colostomy.  Continue to await bowel function, but if gets extubated could have a few sips of clear liquids from the floor. ?-cont abx therapy for 5 days ?-NS WD dressing changes to abdominal wound BID ?-WOC consult for new colostomy for Monday ?-cont JP drain  ? ?FEN - NPO/IVFs/hypocalcemia - replaced by CCM ?VTE - heparin ?ID - zosyn ? ?Acute hypoxic respiratory failure - wean as able per CCM ?Septic shock - still on neo, but coming down ?AKI - improving.  Cr down to 1.95 and making urine ?Hypothyroidism ?Dementia ? ?ADDENDUM: called son, [name], for an update.  He was very Patent attorney.  ? ? LOS: 3 days  ? ? ?Henreitta Cea , PA-C ?[facility] ?01/18/2022, 9:13 AM ?Please see Amion for pager number during day hours 7:00am-4:30pm or 7:00am -11:30am on weekends ? ?

## 2022-01-18 NOTE — Progress Notes (Signed)
Cardiac Monitoring Event ? ?Dysrhythmia:  Atrial fibrillation ? ?Symptoms:  Asymptomatic ? ?Level of Consciousness:  Alert ? ?Last set of vital signs taken: ? Temp: 98.4 ?F (36.9 ?C) ? Pulse Rate: 71 ? Resp: 13 ? BP: (!) 133/55 ? SpO2: 100 % ? ?Name of MD Notified:  Merrily Pew ? ?Time MD Notified:  1500 ? ?Comments/Actions Taken:  No new orders received ?

## 2022-01-18 NOTE — Progress Notes (Signed)
Pharmacy Antibiotic Note ? ?Alexis Cortez is a 88 y.o. female admitted on 01/15/2022 presenting with abdominal pain and pneumoperitoneum.  Pharmacy has been consulted for zosyn dosing. ? ?WBC are WNL at 8.4, patient remains afebrile. SCr remains elevated but is trending down. Currnelty 1.95, baseline is <0.7. Per surgery, will continue antibiotics for a total of 5 days.  ? ?Plan: ?Zosyn 2.25g IV q 8 hours until 4/5 ?Monitor renal function, clinical progression ? ?Height: 5\' 2"  (157.5 cm) ?Weight: 68 kg (150 lb) ?IBW/kg (Calculated) : 50.1 ? ?Temp (24hrs), Avg:98.7 ?F (37.1 ?C), Min:97.9 ?F (36.6 ?C), Max:99.3 ?F (37.4 ?C) ? ?Recent Labs  ?Lab 01/15/22 ?1704 01/15/22 ?1930 01/16/22 ?03/18/22 01/17/22 ?03/19/22 01/18/22 ?03/20/22  ?WBC 8.4  --  2.5* 11.7* 8.4  ?CREATININE 1.77*  --  1.82* 2.18* 1.95*  ?LATICACIDVEN  --  3.3* 4.1*  --   --   ? ?  ?Estimated Creatinine Clearance: 18 mL/min (A) (by C-G formula based on SCr of 1.95 mg/dL (H)).   ? ?No Known Allergies ? ?2426, Pharm.D. ?PGY-1 Pharmacy Resident ?01/18/2022 1:43 PM ?

## 2022-01-19 DIAGNOSIS — N179 Acute kidney failure, unspecified: Secondary | ICD-10-CM | POA: Diagnosis not present

## 2022-01-19 DIAGNOSIS — K668 Other specified disorders of peritoneum: Secondary | ICD-10-CM | POA: Diagnosis not present

## 2022-01-19 DIAGNOSIS — J9600 Acute respiratory failure, unspecified whether with hypoxia or hypercapnia: Secondary | ICD-10-CM | POA: Diagnosis not present

## 2022-01-19 DIAGNOSIS — K275 Chronic or unspecified peptic ulcer, site unspecified, with perforation: Secondary | ICD-10-CM | POA: Diagnosis not present

## 2022-01-19 LAB — MAGNESIUM: Magnesium: 1.4 mg/dL — ABNORMAL LOW (ref 1.7–2.4)

## 2022-01-19 LAB — BASIC METABOLIC PANEL
Anion gap: 9 (ref 5–15)
BUN: 29 mg/dL — ABNORMAL HIGH (ref 8–23)
CO2: 21 mmol/L — ABNORMAL LOW (ref 22–32)
Calcium: 6.6 mg/dL — ABNORMAL LOW (ref 8.9–10.3)
Chloride: 102 mmol/L (ref 98–111)
Creatinine, Ser: 1.62 mg/dL — ABNORMAL HIGH (ref 0.44–1.00)
GFR, Estimated: 30 mL/min — ABNORMAL LOW (ref >60)
Glucose, Bld: 120 mg/dL — ABNORMAL HIGH (ref 70–99)
Potassium: 3.4 mmol/L — ABNORMAL LOW (ref 3.5–5.1)
Sodium: 132 mmol/L — ABNORMAL LOW (ref 135–145)

## 2022-01-19 LAB — CBC
HCT: 29.7 % — ABNORMAL LOW (ref 36.0–46.0)
Hemoglobin: 10.1 g/dL — ABNORMAL LOW (ref 12.0–15.0)
MCH: 30.1 pg (ref 26.0–34.0)
MCHC: 34 g/dL (ref 30.0–36.0)
MCV: 88.7 fL (ref 80.0–100.0)
Platelets: 116 10*3/uL — ABNORMAL LOW (ref 150–400)
RBC: 3.35 MIL/uL — ABNORMAL LOW (ref 3.87–5.11)
RDW: 13.9 % (ref 11.5–15.5)
WBC: 5.9 10*3/uL (ref 4.0–10.5)
nRBC: 0 % (ref 0.0–0.2)

## 2022-01-19 LAB — SURGICAL PATHOLOGY

## 2022-01-19 LAB — GLUCOSE, CAPILLARY
Glucose-Capillary: 112 mg/dL — ABNORMAL HIGH (ref 70–99)
Glucose-Capillary: 107 mg/dL — ABNORMAL HIGH (ref 70–99)
Glucose-Capillary: 100 mg/dL — ABNORMAL HIGH (ref 70–99)
Glucose-Capillary: 118 mg/dL — ABNORMAL HIGH (ref 70–99)
Glucose-Capillary: 107 mg/dL — ABNORMAL HIGH (ref 70–99)

## 2022-01-19 LAB — PHOSPHORUS: Phosphorus: 2.5 mg/dL (ref 2.5–4.6)

## 2022-01-19 MED ORDER — CALCIUM GLUCONATE-NACL 2-0.675 GM/100ML-% IV SOLN
2.0000 g | Freq: Once | INTRAVENOUS | Status: AC
  Administered 2022-01-19: 2000 mg via INTRAVENOUS
  Filled 2022-01-19: qty 100

## 2022-01-19 MED ORDER — MAGNESIUM SULFATE 4 GM/100ML IV SOLN
4.0000 g | Freq: Once | INTRAVENOUS | Status: AC
  Administered 2022-01-19: 4 g via INTRAVENOUS
  Filled 2022-01-19: qty 100

## 2022-01-19 MED ORDER — SODIUM CHLORIDE 0.9% FLUSH
10.0000 mL | Freq: Two times a day (BID) | INTRAVENOUS | Status: DC
  Administered 2022-01-19 – 2022-01-25 (×9): 10 mL

## 2022-01-19 MED ORDER — SODIUM CHLORIDE 0.9% FLUSH: 10.0000 mL | INTRAVENOUS | Status: DC | PRN

## 2022-01-19 MED ORDER — POTASSIUM CHLORIDE 10 MEQ/100ML IV SOLN
10.0000 meq | INTRAVENOUS | Status: AC
  Administered 2022-01-19 (×4): 10 meq via INTRAVENOUS
  Filled 2022-01-19 (×4): qty 100

## 2022-01-19 MED ORDER — CALCIUM GLUCONATE-NACL 1-0.675 GM/50ML-% IV SOLN
1.0000 g | Freq: Once | INTRAVENOUS | Status: DC
  Filled 2022-01-19: qty 50

## 2022-01-19 MED ORDER — ACETAMINOPHEN 10 MG/ML IV SOLN
1000.0000 mg | Freq: Four times a day (QID) | INTRAVENOUS | Status: AC
  Administered 2022-01-19 (×2): 1000 mg via INTRAVENOUS
  Filled 2022-01-19 (×2): qty 100

## 2022-01-19 MED ORDER — PIPERACILLIN-TAZOBACTAM 3.375 G IVPB
3.3750 g | Freq: Three times a day (TID) | INTRAVENOUS | Status: AC
  Administered 2022-01-19 – 2022-01-23 (×12): 3.375 g via INTRAVENOUS
  Filled 2022-01-19 (×14): qty 50

## 2022-01-19 NOTE — Progress Notes (Signed)
Pharmacy Antibiotic Note ? ?Alexis Cortez is a 88 y.o. female admitted on 01/15/2022 presenting with abdominal pain and pneumoperitoneum.  Pharmacy has been consulted for zosyn dosing. ? ?WBC are WNL at 5.9, patient remains afebrile. SCr remains elevated but is trending down. Currnelty 1.62, baseline is <0.7. 1 blood culture has grown gram variable rods in the anaerobic bottle. Patient will likely need longer than a 5 day course of antibiotics given bacteriemia.  ? ?Plan: ?Zosyn 3.375g IV q 8 hours ?Monitor renal function, clinical progression ?F/up duration of treatment  ? ?Height: 5\' 2"  (157.5 cm) ?Weight: 68 kg (150 lb) ?IBW/kg (Calculated) : 50.1 ? ?Temp (24hrs), Avg:97.5 ?F (36.4 ?C), Min:97 ?F (36.1 ?C), Max:97.9 ?F (36.6 ?C) ? ?Recent Labs  ?Lab 01/15/22 ?1704 01/15/22 ?1930 01/16/22 ?03/18/22 01/17/22 ?03/19/22 01/18/22 ?03/20/22 01/19/22 ?0325  ?WBC 8.4  --  2.5* 11.7* 8.4 5.9  ?CREATININE 1.77*  --  1.82* 2.18* 1.95* 1.62*  ?LATICACIDVEN  --  3.3* 4.1*  --   --   --   ? ?  ?Estimated Creatinine Clearance: 21.7 mL/min (A) (by C-G formula based on SCr of 1.62 mg/dL (H)).   ? ?No Known Allergies ? ?03/21/22, Pharm.D. ?PGY-1 Pharmacy Resident ?01/19/2022 8:43 AM ?

## 2022-01-19 NOTE — Plan of Care (Signed)
  Problem: Clinical Measurements: Goal: Will remain free from infection Outcome: Progressing Goal: Respiratory complications will improve Outcome: Progressing Goal: Cardiovascular complication will be avoided Outcome: Progressing   Problem: Activity: Goal: Risk for activity intolerance will decrease Outcome: Progressing   Problem: Nutrition: Goal: Adequate nutrition will be maintained Outcome: Progressing   

## 2022-01-19 NOTE — Progress Notes (Signed)
Patient admitted to 4E from 99M. VS are stable. Tele applied; CCMD called. CHG bath given. Pt is A/O x4. Son at bedside. Will continue to monitor. ? ?Brooke Pace, RN ?01/19/22 ?7:07 PM ? ?

## 2022-01-19 NOTE — Progress Notes (Signed)
? ?NAME:  Alexis Cortez, MRN:  [id number], DOB:  [birth year], LOS: 4 ?ADMISSION DATE:  01/15/2022, CONSULTATION DATE:  4/1 ?REFERRING MD:  Chrissie Noa, CHIEF COMPLAINT:  Post op resp failure   ? ?History of Present Illness:  ? ?Patient is intubated. Therefore history has been obtained from chart review.  ?  ?Alexis Cortez, is a 88 y.o. female, never smoker/ retired band Art therapist, who presented to the [facility] ED 4pm 3/31 via EMS with a chief complaint of lower abdominal pain with a pertinent past medical history of dementia, hypothyroidism, HTN, resident of assisted living.  ?  ?ED course was notable for CT abdomen and pelvis with moderate pneumoperitoneum, mild sigmoid diverticulosis.  General surgery was consulted and admitted the patient.  She was started on Zosyn.  Overnight the patient became tachycardic and hypotensive.  The patient was taken to the OR.  Patient was found to have a Stercoral ulcer with perforation and underwent a sigmoid colectomy and end colostomy.  She remained intubated postoperatively. PCCM was consulted for ICU and ventilator management. ?  ? ?Pertinent  Medical History  ? ?dementia, hypothyroidism, HTN, resident of assisted living.  ? ?Significant Hospital Events: ?Including procedures, antibiotic start and stop dates in addition to other pertinent events   ?3/31 presented with Abd pain, CT abdomen and pelvis with moderate pneumoperitoneum, mild sigmoid diverticulosis, GS consult ?4/1 sigmoid colectomy and end colostomy, remained  intubated post op  ?4/3 Extubated  ? ?Interim History / Subjective:  ?Extubated yesterday, doing well on 3LNC ?Levophed discontinued this morning  ? ?Objective   ?Blood pressure 102/60, pulse 60, temperature (!) 97 ?F (36.1 ?C), temperature source Oral, resp. rate 11, height 5\' 2"  (1.575 m), weight 68 kg, SpO2 100 %. ?CVP:  [5 mmHg-12 mmHg] 12 mmHg  ?PEEP:  [5 cmH20] 5 cmH20 ?Pressure Support:  [8 cmH20] 8 cmH20  ? ?Intake/Output Summary (Last 24 hours) at 01/19/2022  0842 ?Last data filed at 01/19/2022 0700 ?Gross per 24 hour  ?Intake 3257.43 ml  ?Output 1220 ml  ?Net 2037.43 ml  ? ?Filed Weights  ? 01/15/22 1617  ?Weight: 68 kg  ? ? ?Examination: ?General: critically ill appearing female  ?HENT: EOMI. Normal conjunctiva  ?Neuro: awake, alert, able to follow commands  ?Lungs: course breath sounds diffusely. Normal WOB on 3L ?Cardiovascular: RRR no murmurs  ?Abdomen: soft, non distended. Appropriately tender. Colostomy with well appearing stoma  ?JP drain with minimal serosanguinous drainage ?Extremities: No LE edema, SCDs in place ? ? ?Assessment & Plan:  ? ?Acute hypoxic respiratory failure with Septic shock, secondary to stercoral ulcer with perforation leading to acute peritonitis  s/p sigmoid colectomy and end colostomy on 4/1 ?Colostomy with well appearing stoma. No drainage.  JP drain with minimal serosanguinous drainage ?Blood cultures growing gram variable rods, treating with Zosyn (day 4)  ?BP this am 102/60 ?- General surgery consulted ?- Wound care consulted  ?- Continue IV antibiotics with Zosyn ?- Levophed d/c this am. Will continue to monitor and keep map > 65 ? ?Anemia, thrombocytopenia  ?Hgb 10.1 down from 11.2 yesterday, platelets 116 down from 154. Likely in the setting of acute blood loss. ?- Continue to monitor  ?- am CBC ? ?Hypokalemia  ?K+ 3.4. Repleting with 80meq IV  ?- continue to monitor  ? ?AKI on ACEi prior to admit  ?Cr. 1.62 down from 1.65 yesterday. Baseline serum creatinine is around 0.7 likely in the setting of ischemic ATN caused by septic shock ?- Continue IV fluid  resuscitation D10 at 6mL/h, LR 56mL/h  ?- strict intake and output ?- Avoid nephrotoxic agents. Can consider restarting Lisinoril when closer to baseline and when BP improves  ?  ?Hypocalcemia ?Ca 6.6 ?- Continue calcium gluconate  ? ?Chronic biventricular heart failure ?Net + 2.273.4. UOP yesterday 1.145 L ?- Monitor intake and output ?  ?Hypothyroidism ?Last TSH in August 2021  1.82 ?Consider restarting Synthyroid 38mcg daily  ? ?Anxiety and depression  ?Consider starting back home psych meds:  ?Zyprexa 2.5mg  at night  ?Remeron 15mg  at night  ? ?Stable for transfer to progressive unit  ? ?Best Practice (right click and "Reselect all SmartList Selections" daily)  ? ?Diet/type: clear liquid  ?DVT prophylaxis: prophylactic heparin  ?GI prophylaxis: PPI ?Lines: N/A ?Foley:  Removed  ?Code Status:  full code ? ?Labs   ?CBC: ?Recent Labs  ?Lab 01/15/22 ?1704 01/16/22 ?[id number] 01/16/22 ?[id number] 01/17/22 ?0323 01/17/22 ?1132 01/18/22 ?[id number] 01/18/22 ?[id number] 01/19/22 ?0325  ?WBC 8.4 2.5*  --  11.7*  --  8.4  --  5.9  ?NEUTROABS 6.6  --   --   --   --   --   --   --   ?HGB 14.2 14.3   < > 14.2 13.6 11.3* 11.2* 10.1*  ?HCT 44.7 45.0   < > 42.3 40.0 33.3* 33.0* 29.7*  ?MCV 94.5 93.8  --  89.4  --  87.9  --  88.7  ?PLT 172 222  --  196  --  154  --  116*  ? < > = values in this interval not displayed.  ? ? ?Basic Metabolic Panel: ?Recent Labs  ?Lab 01/15/22 ?1704 01/16/22 ?[id number] 01/16/22 ?[id number] 01/17/22 ?0323 01/17/22 ?1132 01/18/22 ?[id number] 01/18/22 ?[id number] 01/19/22 ?0325  ?NA 137 139   < > 135 134* 131* 130* 132*  ?K 4.3 3.5   < > 4.1 4.0 3.9 3.7 3.4*  ?CL 106 118*  --  107  --  100  --  102  ?CO2 15* 12*  --  15*  --  21*  --  21*  ?GLUCOSE 177* 180*  --  140*  --  78  --  120*  ?BUN 33* 32*  --  36*  --  32*  --  29*  ?CREATININE 1.77* 1.82*  --  2.18*  --  1.95*  --  1.62*  ?CALCIUM 8.6* 6.9*  --  6.0*  --  6.3*  --  6.6*  ?MG  --  1.8  --   --   --   --   --   --   ?PHOS  --  3.0  --   --   --   --   --   --   ? < > = values in this interval not displayed.  ? ?GFR: ?Estimated Creatinine Clearance: 21.7 mL/min (A) (by C-G formula based on SCr of 1.62 mg/dL (H)). ?Recent Labs  ?Lab 01/15/22 ?1930 01/16/22 ?[id number] 01/17/22 ?[id number] 01/18/22 ?[id number] 01/19/22 ?0325  ?WBC  --  2.5* 11.7* 8.4 5.9  ?LATICACIDVEN 3.3* 4.1*  --   --   --   ? ? ?Liver Function Tests: ?Recent Labs  ?Lab 01/15/22 ?1704  ?AST 28  ?ALT 13  ?ALKPHOS  62  ?BILITOT 1.0  ?PROT 6.2*  ?ALBUMIN 3.4*  ? ?Recent Labs  ?Lab 01/15/22 ?1704  ?LIPASE 37  ? ?No results for input(s): AMMONIA in the last 168 hours. ? ?ABG ?   ?Component Value Date/Time  ? PHART 7.451 (  H) 01/18/2022 [id number]  ? PCO2ART 31.5 (L) 01/18/2022 0934  ? PO2ART 180 (H) 01/18/2022 0934  ? HCO3 21.9 01/18/2022 0934  ? TCO2 23 01/18/2022 0934  ? ACIDBASEDEF 1.0 01/18/2022 0934  ? O2SAT 100 01/18/2022 0934  ?  ? ?Coagulation Profile: ?No results for input(s): INR, PROTIME in the last 168 hours. ? ?Cardiac Enzymes: ?No results for input(s): CKTOTAL, CKMB, CKMBINDEX, TROPONINI in the last 168 hours. ? ?HbA1C: ?No results found for: HGBA1C ? ?CBG: ?Recent Labs  ?Lab 01/18/22 ?1940 01/18/22 ?2008 01/18/22 ?2333 01/19/22 ?[id number] 01/19/22 ?0732  ?GLUCAP 53* 96 101* 112* 107*  ? ? ?Past Medical History:  ?She,  has a past medical history of Hypertension, Hypertensive retinopathy, and Macular degeneration.  ? ?Surgical History:  ? ?Past Surgical History:  ?Procedure Laterality Date  ? CATARACT EXTRACTION Bilateral   ? COLECTOMY N/A 01/16/2022  ? Procedure: SIGMOID COLECTOMY;  Surgeon: Rolm Bookbinder, MD;  Location: [place];  Service: General;  Laterality: N/A;  ? COLOSTOMY  01/16/2022  ? Procedure: COLOSTOMY;  Surgeon: Rolm Bookbinder, MD;  Location: [place];  Service: General;;  ? EYE SURGERY    ? YAG LASER APPLICATION Right   ?  ? ?Social History:  ? reports that she has never smoked. She has never used smokeless tobacco. She reports current alcohol use of about 2.0 standard drinks per week. She reports that she does not use drugs.  ? ?Family History:  ?Her family history is not on file.  ? ?Allergies ?No Known Allergies  ? ?Home Medications  ?Prior to Admission medications   ?Medication Sig Start Date End Date Taking? Authorizing Provider  ?acetaminophen (TYLENOL) 500 MG tablet Take 500 mg by mouth every 6 (six) hours as needed for mild pain, fever or headache.   Yes [provider]  ?aluminum-magnesium  hydroxide-simethicone (MAALOX) [id number] MG/5ML SUSP Take 30 mLs by mouth every 6 (six) hours as needed (heartburn/indigestion).   Yes [provider]  ?bisacodyl (DULCOLAX) 10 MG suppository Place 10

## 2022-01-19 NOTE — Progress Notes (Signed)
Occupational Therapy Evaluation ?Patient Details ?Name: Alexis Cortez ?MRN: [id number] ?DOB: 1931/01/08 ?Today's Date: 01/19/2022 ? ? ?History of Present Illness Pt adm 3/31 with abdominal pain. Pt found to have stercoral ulcer with perforation and underwent colectomy and colostomy on 4/1. Pt remained on vent until extubated on 4/3.  PMH - HTN, macular degeneration, dementia.  ? ?Clinical Impression ?  ?PTA, pt from ALF, typically ambulatory with Rollator and receives light assist for ADLs. Pt presents now with diagnoses above and deficits in strength, endurance, cognition, and standing balance. Pt overall Mod A x 2 for bed mobility and sidesteps at bedside using RW. Deferred further OOB activities per pt request and dizziness (BP stable). Pt requires Mod A for UB ADL and up to Total A for LB ADLs due to deficits. Recommend SNF rehab prior to return to ALF to maximize safety with daily tasks.  ?   ? ?Recommendations for follow up therapy are one component of a multi-disciplinary discharge planning process, led by the attending physician.  Recommendations may be updated based on patient status, additional functional criteria and insurance authorization.  ? ?Follow Up Recommendations ? Skilled nursing-short term rehab (<3 hours/day)  ?  ?Assistance Recommended at Discharge Frequent or constant Supervision/Assistance  ?Patient can return home with the following A lot of help with walking and/or transfers;A lot of help with bathing/dressing/bathroom ? ?  ?Functional Status Assessment ? Patient has had a recent decline in their functional status and demonstrates the ability to make significant improvements in function in a reasonable and predictable amount of time.  ?Equipment Recommendations ? Other (comment) (defer to next venue)  ?  ?Recommendations for Other Services   ? ? ?  ?Precautions / Restrictions Precautions ?Precautions: Fall;Other (comment) ?Precaution Comments: colostomy, JP drain ?Restrictions ?Weight  Bearing Restrictions: No  ? ?  ? ?Mobility Bed Mobility ?Overal bed mobility: Needs Assistance ?Bed Mobility: Rolling, Sidelying to Sit, Sit to Sidelying ?Rolling: +2 for physical assistance, Max assist ?Sidelying to sit: +2 for physical assistance, Mod assist, HOB elevated ?  ?  ?Sit to sidelying: +2 for physical assistance, Mod assist ?General bed mobility comments: Assist to bring legs off of bed, elevate trunk into sitting and bring hips to EOB. Assist to lower trunk and bring feet back up into bed ?  ? ?Transfers ?Overall transfer level: Needs assistance ?Equipment used: Rolling walker (2 wheels) ?Transfers: Sit to/from Stand ?Sit to Stand: +2 physical assistance, Mod assist ?  ?  ?  ?  ?  ?General transfer comment: Assist to bring hips up and for balance ?  ? ?  ?Balance Overall balance assessment: Needs assistance ?Sitting-balance support: No upper extremity supported, Feet supported ?Sitting balance-Leahy Scale: Fair ?  ?  ?Standing balance support: Bilateral upper extremity supported ?Standing balance-Leahy Scale: Poor ?Standing balance comment: walker and min assist for static standing ?  ?  ?  ?  ?  ?  ?  ?  ?  ?  ?  ?   ? ?ADL either performed or assessed with clinical judgement  ? ?ADL Overall ADL's : Needs assistance/impaired ?Eating/Feeding: Set up;Sitting ?  ?Grooming: Set up;Sitting ?Grooming Details (indicate cue type and reason): able to wipe face multiple times sitting EOB ?Upper Body Bathing: Sitting;Moderate assistance ?  ?Lower Body Bathing: Maximal assistance;Sit to/from stand ?  ?Upper Body Dressing : Sitting;Minimal assistance ?  ?Lower Body Dressing: Maximal assistance;Sit to/from stand ?  ?Toilet Transfer: Moderate assistance;+2 for physical assistance;+2 for safety/equipment;Stand-pivot;Rolling walker (2 wheels) ?  ?  Toileting- Clothing Manipulation and Hygiene: Total assistance;Sit to/from stand ?  ?  ?  ?  ?General ADL Comments: Fairly well despite recent surgery, limited by strength  and endurance deficits  ? ? ? ?Vision Baseline Vision/History: 1 Wears glasses ?Ability to See in Adequate Light: 0 Adequate ?Patient Visual Report: No change from baseline ?Vision Assessment?: No apparent visual deficits  ?   ?Perception   ?  ?Praxis   ?  ? ?Pertinent Vitals/Pain Pain Assessment ?Pain Assessment: Faces ?Faces Pain Scale: Hurts a little bit ?Pain Location: abdomen with mobility ?Pain Descriptors / Indicators: Grimacing ?Pain Intervention(s): Limited activity within patient's tolerance, Monitored during session  ? ? ? ?Hand Dominance Right ?  ?Extremity/Trunk Assessment Upper Extremity Assessment ?Upper Extremity Assessment: Generalized weakness ?  ?Lower Extremity Assessment ?Lower Extremity Assessment: Defer to PT evaluation ?  ?Cervical / Trunk Assessment ?Cervical / Trunk Assessment: Kyphotic ?  ?Communication Communication ?Communication: No difficulties ?  ?Cognition Arousal/Alertness: Awake/alert ?Behavior During Therapy: Flat affect ?Overall Cognitive Status: No family/caregiver present to determine baseline cognitive functioning ?Area of Impairment: Orientation, Attention, Memory, Following commands, Safety/judgement, Problem solving, Awareness ?  ?  ?  ?  ?  ?  ?  ?  ?Orientation Level: Disoriented to, Situation, Time ?Current Attention Level: Sustained ?Memory: Decreased short-term memory ?Following Commands: Follows one step commands with increased time ?  ?Awareness: Intellectual ?Problem Solving: Slow processing, Requires verbal cues ?General Comments: Pt with baseline dementia so likely not far from baseline; follows directions consistently - some difficulty with PLOF recall (unable to recall ALF name) ?  ?  ?General Comments  VSS on 2 LO2 ? ?  ?Exercises   ?  ?Shoulder Instructions    ? ? ?Home Living Family/patient expects to be discharged to:: Assisted living ?  ?  ?  ?  ?  ?  ?  ?  ?  ?  ?  ?  ?  ?  ?Home Equipment: Rollator (4 wheels) ?  ?Additional Comments: wears O2 at baseline  but pt reports unsure of how much ?  ? ?  ?Prior Functioning/Environment Prior Level of Function : Needs assist ?  ?  ?  ?Physical Assist : ADLs (physical) ?  ?  ?Mobility Comments: Amb with rollator modified independent. Amb to dining room ?ADLs Comments: Assist for bathing and dressing, mostly LB assist ?  ? ?  ?  ?OT Problem List: Decreased strength;Decreased activity tolerance;Impaired balance (sitting and/or standing);Decreased cognition;Decreased knowledge of use of DME or AE;Cardiopulmonary status limiting activity;Pain ?  ?   ?OT Treatment/Interventions: Therapeutic exercise;Self-care/ADL training;Energy conservation;DME and/or AE instruction;Therapeutic activities;Patient/family education;Balance training  ?  ?OT Goals(Current goals can be found in the care plan section) Acute Rehab OT Goals ?Patient Stated Goal: son agreeable for rehab ?OT Goal Formulation: With patient/family ?Time For Goal Achievement: 02/02/22 ?Potential to Achieve Goals: Good ?ADL Goals ?Pt Will Perform Grooming: with supervision;standing ?Pt Will Transfer to Toilet: with min guard assist;ambulating ?Pt/caregiver will Perform Home Exercise Program: Increased strength;Both right and left upper extremity;With theraband;With Supervision;With written HEP provided ?Additional ADL Goal #1: Pt to complete bed mobility with supervision in prep for ADLs  ?OT Frequency: Min 2X/week ?  ? ?Co-evaluation PT/OT/SLP Co-Evaluation/Treatment: Yes ?Reason for Co-Treatment: For patient/therapist safety;To address functional/ADL transfers ?PT goals addressed during session: Mobility/safety with mobility ?OT goals addressed during session: ADL's and self-care;Strengthening/ROM ?  ? ?  ?AM-PAC OT "6 Clicks" Daily Activity     ?Outcome Measure Help from another person eating  meals?: A Little ?Help from another person taking care of personal grooming?: A Little ?Help from another person toileting, which includes using toliet, bedpan, or urinal?: Total ?Help  from another person bathing (including washing, rinsing, drying)?: A Lot ?Help from another person to put on and taking off regular upper body clothing?: A Little ?Help from another person to put on and taking

## 2022-01-19 NOTE — Progress Notes (Signed)
? ? ?3 Days Post-Op  ?Subjective: ?Extubated.  Looks great.  Oriented x3.  No nausea.  Ostomy changed while I was in there.   ? ?ROS: see HPI ? ?Objective: ?Vital signs in last 24 hours: ?Temp:  [97 ?F (36.1 ?C)-97.9 ?F (36.6 ?C)] 97 ?F (36.1 ?C) (04/04 0800) ?Pulse Rate:  [54-71] 60 (04/04 0700) ?Resp:  [10-21] 11 (04/04 0700) ?BP: (102-130)/(52-60) 102/60 (04/04 0700) ?SpO2:  [97 %-100 %] 100 % (04/04 0700) ?Arterial Line BP: (106-146)/(41-60) 135/58 (04/04 0300) ?Last BM Date : 01/18/22 (minimal amount of liquid stool upon turning) ? ?Intake/Output from previous day: ?04/03 0701 - 04/04 0700 ?In: 3713.4 [I.V.:3059.3; IV Piggyback:654.1] ?Out: 1440 [Urine:1145; Drains:295] ?Intake/Output this shift: ?No intake/output data recorded. ? ?PE: ?Heart: irregular ?Lungs: CTAB ?Abd: soft, appropriately tender, colostomy with viable stoma, no output yet.  Midline wound is clean and packed.  Packing removed and wound intact.  JP with serosang output, 295cc yesterday.  ? ? ?Lab Results:  ?Recent Labs  ?  01/18/22 ?9476 01/18/22 ?5465 01/19/22 ?0325  ?WBC 8.4  --  5.9  ?HGB 11.3* 11.2* 10.1*  ?HCT 33.3* 33.0* 29.7*  ?PLT 154  --  116*  ? ?BMET ?Recent Labs  ?  01/18/22 ?0354 01/18/22 ?6568 01/19/22 ?0325  ?NA 131* 130* 132*  ?K 3.9 3.7 3.4*  ?CL 100  --  102  ?CO2 21*  --  21*  ?GLUCOSE 78  --  120*  ?BUN 32*  --  29*  ?CREATININE 1.95*  --  1.62*  ?CALCIUM 6.3*  --  6.6*  ? ?PT/INR ?No results for input(s): LABPROT, INR in the last 72 hours. ?CMP  ?   ?Component Value Date/Time  ? NA 132 (L) 01/19/2022 0325  ? K 3.4 (L) 01/19/2022 0325  ? CL 102 01/19/2022 0325  ? CO2 21 (L) 01/19/2022 0325  ? GLUCOSE 120 (H) 01/19/2022 0325  ? BUN 29 (H) 01/19/2022 0325  ? CREATININE 1.62 (H) 01/19/2022 0325  ? CALCIUM 6.6 (L) 01/19/2022 0325  ? PROT 6.2 (L) 01/15/2022 1704  ? ALBUMIN 3.4 (L) 01/15/2022 1704  ? AST 28 01/15/2022 1704  ? ALT 13 01/15/2022 1704  ? ALKPHOS 62 01/15/2022 1704  ? BILITOT 1.0 01/15/2022 1704  ? GFRNONAA 30 (L)  01/19/2022 0325  ? GFRAA >60 10/10/2019 0307  ? ?Lipase  ?   ?Component Value Date/Time  ? LIPASE 37 01/15/2022 1704  ? ? ? ? ? ?Studies/Results: ?No results found. ? ?Anti-infectives: ?Anti-infectives (From admission, onward)  ? ? Start     Dose/Rate Route Frequency Ordered Stop  ? 01/17/22 1400  piperacillin-tazobactam (ZOSYN) IVPB 2.25 g       ? 2.25 g ?100 mL/hr over 30 Minutes Intravenous Every 8 hours 01/17/22 0725 01/21/22 1359  ? 01/16/22 1800  piperacillin-tazobactam (ZOSYN) IVPB 2.25 g  Status:  Discontinued       ? 2.25 g ?100 mL/hr over 30 Minutes Intravenous Every 6 hours 01/16/22 1420 01/17/22 0725  ? 01/16/22 1000  piperacillin-tazobactam (ZOSYN) IVPB 3.375 g  Status:  Discontinued       ? 3.375 g ?12.5 mL/hr over 240 Minutes Intravenous Every 12 hours 01/15/22 2129 01/16/22 1420  ? 01/15/22 1945  piperacillin-tazobactam (ZOSYN) IVPB 3.375 g       ? 3.375 g ?100 mL/hr over 30 Minutes Intravenous  Once 01/15/22 1939 01/15/22 2045  ? ?  ? ? ? ?Assessment/Plan ?POD 3, s/p  ex lap with Hartmann's procedure by Dr. Dwain Sarna 01/16/22  for stercoral ulcer perforation ?-no nausea, will try CLD despite no output in ostomy yet.  No nausea and abdomen very soft and ND ?-cont abx therapy for 5 days ?-NS WD dressing changes to abdominal wound BID ?-WOC consult for new colostomy, appreciate their assistance ?-cont JP drain  ? ?FEN - CLD/IVFs/hypocalcemia and hypokalemia - replaced today ?VTE - heparin ?ID - zosyn ? ?Acute hypoxic respiratory failure - wean as able per CCM ?Septic shock - still on neo, but coming down ?AKI - improving.  Cr down to 1.65 and making urine, 1145cc ?Hypothyroidism ?Dementia - but very with it today, no agitation, knows exactly where she is, when it is, and what is going on. ?HF ?? A fib - EKG ordered ? ?CCM to contact me later today to see how she is doing about tx to SDU vs in ICU for one more day ? ?I have reviewed the last 24 hours of her I/O, vitals, labs, imaging, and discussed patient  with consulting services ? ? LOS: 4 days  ? ? ?Letha Cape , PA-C ?[facility] ?01/19/2022, 8:17 AM ?Please see Amion for pager number during day hours 7:00am-4:30pm or 7:00am -11:30am on weekends ? ?

## 2022-01-19 NOTE — Progress Notes (Signed)
Wasted 75 mL Fentanyl in stericycle bin with Lendon Collar, RN.  ?

## 2022-01-19 NOTE — Evaluation (Signed)
Physical Therapy Evaluation ?Patient Details ?Name: Alexis Cortez ?MRN: [id number] ?DOB: 1931-08-28 ?Today's Date: 01/19/2022 ? ?History of Present Illness ? Pt adm 3/31 with abdominal pain. Pt found to have stercoral ulcer with perforation and underwent colectomy and colostomy on 4/1. Pt remained on vent until extubated on 4/3.  PMH - HTN, macular degeneration, dementia.  ?Clinical Impression ? Pt admitted with above diagnosis and presents to PT with functional limitations due to deficits listed below (See PT problem list). Pt needs skilled PT to maximize independence and safety to allow discharge to SNF.  ?   ? ?Recommendations for follow up therapy are one component of a multi-disciplinary discharge planning process, led by the attending physician.  Recommendations may be updated based on patient status, additional functional criteria and insurance authorization. ? ?Follow Up Recommendations Skilled nursing-short term rehab (<3 hours/day) ? ?  ?Assistance Recommended at Discharge Frequent or constant Supervision/Assistance  ?Patient can return home with the following ? A lot of help with walking and/or transfers ? ?  ?Equipment Recommendations None recommended by PT  ?Recommendations for Other Services ?    ?  ?Functional Status Assessment Patient has had a recent decline in their functional status and demonstrates the ability to make significant improvements in function in a reasonable and predictable amount of time.  ? ?  ?Precautions / Restrictions Precautions ?Precautions: Fall;Other (comment) ?Precaution Comments: colostomy, JP drain ?Restrictions ?Weight Bearing Restrictions: No  ? ?  ? ?Mobility ? Bed Mobility ?Overal bed mobility: Needs Assistance ?Bed Mobility: Rolling, Sidelying to Sit, Sit to Sidelying ?Rolling: +2 for physical assistance, Max assist ?Sidelying to sit: +2 for physical assistance, Mod assist, HOB elevated ?  ?  ?Sit to sidelying: +2 for physical assistance, Mod assist ?General bed  mobility comments: Assist to bring legs off of bed, elevate trunk into sitting and bring hips to EOB. Assist to lower trunk and bring feet back up into bed ?  ? ?Transfers ?Overall transfer level: Needs assistance ?Equipment used: Rolling walker (2 wheels) ?Transfers: Sit to/from Stand ?Sit to Stand: +2 physical assistance, Mod assist ?  ?  ?  ?  ?  ?General transfer comment: Assist to bring hips up and for balance ?  ? ?Ambulation/Gait ?Ambulation/Gait assistance: +2 physical assistance, Min assist ?Gait Distance (Feet): 3 Feet ?Assistive device: Rolling walker (2 wheels) ?Gait Pattern/deviations: Step-to pattern, Decreased step length - right, Decreased step length - left, Shuffle ?Gait velocity: decr ?Gait velocity interpretation: <1.31 ft/sec, indicative of household ambulator ?  ?General Gait Details: Assist for balance and support to side step up side of bed. ? ?Stairs ?  ?  ?  ?  ?  ? ?Wheelchair Mobility ?  ? ?Modified Rankin (Stroke Patients Only) ?  ? ?  ? ?Balance Overall balance assessment: Needs assistance ?Sitting-balance support: No upper extremity supported, Feet supported ?Sitting balance-Leahy Scale: Fair ?  ?  ?Standing balance support: Bilateral upper extremity supported ?Standing balance-Leahy Scale: Poor ?Standing balance comment: walker and min assist for static standing ?  ?  ?  ?  ?  ?  ?  ?  ?  ?  ?  ?   ? ? ? ?Pertinent Vitals/Pain Pain Assessment ?Pain Assessment: Faces ?Faces Pain Scale: Hurts a little bit ?Pain Location: abdomen with mobility ?Pain Descriptors / Indicators: Grimacing ?Pain Intervention(s): Limited activity within patient's tolerance, Monitored during session, Repositioned  ? ? ?Home Living Family/patient expects to be discharged to:: Assisted living ?  ?  ?  ?  ?  ?  ?  ?  ?  Home Equipment: Rollator (4 wheels) ?Additional Comments: wears O2 at baseline  ?  ?Prior Function Prior Level of Function : Needs assist ?  ?  ?  ?Physical Assist : ADLs (physical) ?  ?  ?Mobility  Comments: Amb with rollator modified independent. Amb to dining room ?ADLs Comments: Assist for bathing and dressing ?  ? ? ?Hand Dominance  ?   ? ?  ?Extremity/Trunk Assessment  ? Upper Extremity Assessment ?Upper Extremity Assessment: Defer to OT evaluation ?  ? ?Lower Extremity Assessment ?Lower Extremity Assessment: Generalized weakness ?  ? ?   ?Communication  ? Communication: No difficulties  ?Cognition Arousal/Alertness: Awake/alert ?Behavior During Therapy: Flat affect ?Overall Cognitive Status: No family/caregiver present to determine baseline cognitive functioning ?Area of Impairment: Orientation, Attention, Memory, Following commands, Safety/judgement, Problem solving, Awareness ?  ?  ?  ?  ?  ?  ?  ?  ?Orientation Level: Disoriented to, Situation, Time ?Current Attention Level: Sustained ?Memory: Decreased short-term memory ?Following Commands: Follows one step commands with increased time ?  ?Awareness: Intellectual ?Problem Solving: Slow processing, Requires verbal cues ?General Comments: Pt with baseline dementia so likely not far from baseline ?  ?  ? ?  ?General Comments   ? ?  ?Exercises    ? ?Assessment/Plan  ?  ?PT Assessment Patient needs continued PT services  ?PT Problem List Decreased strength;Decreased activity tolerance;Decreased balance;Decreased mobility ? ?   ?  ?PT Treatment Interventions DME instruction;Gait training;Functional mobility training;Therapeutic activities;Therapeutic exercise;Balance training;Patient/family education   ? ?PT Goals (Current goals can be found in the Care Plan section)  ?Acute Rehab PT Goals ?Patient Stated Goal: Not stated ?PT Goal Formulation: With patient ?Time For Goal Achievement: 02/02/22 ?Potential to Achieve Goals: Good ? ?  ?Frequency Min 2X/week ?  ? ? ?Co-evaluation PT/OT/SLP Co-Evaluation/Treatment: Yes ?Reason for Co-Treatment: For patient/therapist safety;To address functional/ADL transfers ?PT goals addressed during session: Mobility/safety  with mobility ?  ?  ? ? ?  ?AM-PAC PT "6 Clicks" Mobility  ?Outcome Measure Help needed turning from your back to your side while in a flat bed without using bedrails?: Total ?Help needed moving from lying on your back to sitting on the side of a flat bed without using bedrails?: Total ?Help needed moving to and from a bed to a chair (including a wheelchair)?: Total ?Help needed standing up from a chair using your arms (e.g., wheelchair or bedside chair)?: Total ?Help needed to walk in hospital room?: Total ?Help needed climbing 3-5 steps with a railing? : Total ?6 Click Score: 6 ? ?  ?End of Session Equipment Utilized During Treatment: Oxygen ?Activity Tolerance: Patient limited by fatigue ?Patient left: in bed;with call bell/phone within reach;with bed alarm set;with family/visitor present ?Nurse Communication: Mobility status ?PT Visit Diagnosis: Other abnormalities of gait and mobility (R26.89);Muscle weakness (generalized) (M62.81) ?  ? ?Time: [id number] ?PT Time Calculation (min) (ACUTE ONLY): 22 min ? ? ?Charges:   PT Evaluation ?$PT Eval Moderate Complexity: 1 Mod ?  ?  ?   ? ? ?[facility] PT ?Acute Rehabilitation Services ?Pager [phone] ?Office [phone] ? ? ?Sh[facility] Decamp [facility] ?01/19/2022, 11:05 AM ? ?

## 2022-01-19 NOTE — Consult Note (Signed)
WOC Nurse ostomy follow up ?Patient receiving care in [facility] 2M10. ?Stoma type/location: LUQ colostomy ?Stomal assessment/size: 1 3/8 inches, round, pink, has multiple creases around the stoma, sutures intact ?Peristomal assessment: intact ?Treatment options for stomal/peristomal skin: barrier ring ?Output: none ?Ostomy pouching: 1pc. Convex  ?Education provided: none ?Enrolled patient in [place] Secure Start Discharge program: No  ? ?Helmut Muster, RN, MSN, CWOCN, CNS-BC, pager [phone]  ?

## 2022-01-20 DIAGNOSIS — R7881 Bacteremia: Secondary | ICD-10-CM | POA: Insufficient documentation

## 2022-01-20 DIAGNOSIS — N179 Acute kidney failure, unspecified: Secondary | ICD-10-CM | POA: Diagnosis not present

## 2022-01-20 DIAGNOSIS — K668 Other specified disorders of peritoneum: Secondary | ICD-10-CM | POA: Diagnosis not present

## 2022-01-20 DIAGNOSIS — R4189 Other symptoms and signs involving cognitive functions and awareness: Secondary | ICD-10-CM

## 2022-01-20 DIAGNOSIS — D696 Thrombocytopenia, unspecified: Secondary | ICD-10-CM

## 2022-01-20 DIAGNOSIS — R5381 Other malaise: Secondary | ICD-10-CM

## 2022-01-20 DIAGNOSIS — D649 Anemia, unspecified: Secondary | ICD-10-CM

## 2022-01-20 DIAGNOSIS — Z7189 Other specified counseling: Secondary | ICD-10-CM | POA: Diagnosis not present

## 2022-01-20 DIAGNOSIS — R931 Abnormal findings on diagnostic imaging of heart and coronary circulation: Secondary | ICD-10-CM

## 2022-01-20 DIAGNOSIS — J9601 Acute respiratory failure with hypoxia: Secondary | ICD-10-CM | POA: Diagnosis not present

## 2022-01-20 DIAGNOSIS — E871 Hypo-osmolality and hyponatremia: Secondary | ICD-10-CM

## 2022-01-20 DIAGNOSIS — E8809 Other disorders of plasma-protein metabolism, not elsewhere classified: Secondary | ICD-10-CM

## 2022-01-20 DIAGNOSIS — I10 Essential (primary) hypertension: Secondary | ICD-10-CM

## 2022-01-20 LAB — CBC
HCT: 31.5 % — ABNORMAL LOW (ref 36.0–46.0)
Hemoglobin: 10.6 g/dL — ABNORMAL LOW (ref 12.0–15.0)
MCH: 29.9 pg (ref 26.0–34.0)
MCHC: 33.7 g/dL (ref 30.0–36.0)
MCV: 88.7 fL (ref 80.0–100.0)
Platelets: 116 10*3/uL — ABNORMAL LOW (ref 150–400)
RBC: 3.55 MIL/uL — ABNORMAL LOW (ref 3.87–5.11)
RDW: 14 % (ref 11.5–15.5)
WBC: 4.7 10*3/uL (ref 4.0–10.5)
nRBC: 0 % (ref 0.0–0.2)

## 2022-01-20 LAB — BASIC METABOLIC PANEL
Anion gap: 5 (ref 5–15)
BUN: 18 mg/dL (ref 8–23)
CO2: 24 mmol/L (ref 22–32)
Calcium: 7.2 mg/dL — ABNORMAL LOW (ref 8.9–10.3)
Chloride: 103 mmol/L (ref 98–111)
Creatinine, Ser: 1.17 mg/dL — ABNORMAL HIGH (ref 0.44–1.00)
GFR, Estimated: 45 mL/min — ABNORMAL LOW (ref >60)
Glucose, Bld: 103 mg/dL — ABNORMAL HIGH (ref 70–99)
Potassium: 3.9 mmol/L (ref 3.5–5.1)
Sodium: 132 mmol/L — ABNORMAL LOW (ref 135–145)

## 2022-01-20 LAB — RETICULOCYTES
Immature Retic Fract: 5.2 % (ref 2.3–15.9)
RBC.: 3.72 MIL/uL — ABNORMAL LOW (ref 3.87–5.11)
Retic Count, Absolute: 37.2 10*3/uL (ref 19.0–186.0)
Retic Ct Pct: 1 % (ref 0.4–3.1)

## 2022-01-20 LAB — CULTURE, BLOOD (ROUTINE X 2)
Special Requests: ADEQUATE
Special Requests: ADEQUATE

## 2022-01-20 LAB — GLUCOSE, CAPILLARY
Glucose-Capillary: 105 mg/dL — ABNORMAL HIGH (ref 70–99)
Glucose-Capillary: 105 mg/dL — ABNORMAL HIGH (ref 70–99)
Glucose-Capillary: 110 mg/dL — ABNORMAL HIGH (ref 70–99)
Glucose-Capillary: 129 mg/dL — ABNORMAL HIGH (ref 70–99)
Glucose-Capillary: 72 mg/dL (ref 70–99)
Glucose-Capillary: 82 mg/dL (ref 70–99)
Glucose-Capillary: 87 mg/dL (ref 70–99)

## 2022-01-20 LAB — MAGNESIUM: Magnesium: 2 mg/dL (ref 1.7–2.4)

## 2022-01-20 LAB — FERRITIN: Ferritin: 381 ng/mL — ABNORMAL HIGH (ref 11–307)

## 2022-01-20 LAB — ALBUMIN: Albumin: <1.5 g/dL — ABNORMAL LOW (ref 3.5–5.0)

## 2022-01-20 LAB — FOLATE: Folate: 24.4 ng/mL (ref >5.9)

## 2022-01-20 LAB — IRON AND TIBC: Iron: 52 ug/dL (ref 28–170)

## 2022-01-20 LAB — VITAMIN B12: Vitamin B-12: 4665 pg/mL — ABNORMAL HIGH (ref 180–914)

## 2022-01-20 MED ORDER — CALCIUM GLUCONATE-NACL 2-0.675 GM/100ML-% IV SOLN
2.0000 g | Freq: Once | INTRAVENOUS | Status: AC
  Administered 2022-01-20: 2000 mg via INTRAVENOUS
  Filled 2022-01-20: qty 100

## 2022-01-20 NOTE — Assessment & Plan Note (Addendum)
Mild and stable. Asymptomatic. ?

## 2022-01-20 NOTE — Assessment & Plan Note (Signed)
Blood culture on 3/31 with bacteroids thetaiotaomicron.  Likely GI translocation. ?-Continue IV Zosyn ?

## 2022-01-20 NOTE — Assessment & Plan Note (Addendum)
S/p sigmoid colectomy and end colostomy by Dwain Sarna.  ?-Surgical wound care, JP drain and colostomy care per general surgery. ?-Remains on IV Zosyn per general surgery. ?-Encourage incentive spirometry/mobility ?-tylenol scheduled ?

## 2022-01-20 NOTE — Assessment & Plan Note (Addendum)
Likely due to acute illness and some surgical blood loss. Stable. No evidence of bleeding. Associated thrombocytopenia has resolved. ? ?

## 2022-01-20 NOTE — TOC Progression Note (Signed)
Transition of Care (TOC) - Progression Note  ? ? ?Patient Details  ?Name: Alexis Cortez ?MRN: [id number] ?Date of Birth: 1931-09-25 ? ?Transition of Care (TOC) CM/SW Contact  ?Vinie Sill, LCSW ?Phone Number: ?01/20/2022, 3:03 PM ? ?Clinical Narrative:    ? ?CSW met with patient at bedside. CSW introduced self and explained role. She states she is agreeable to short term rehab indicating "I don't believe I have a choice". She confirms she is from [facility]. Patient requested to call her son and update. ? ?CSW called and spoke with [name]. He confirmed disposition plan but also requested to send  SNF referral to [facility]. He believes his mother has paid into their ([facility])plan and maybe could go there for short term rehab. However, no other preferred SNFs at this time. ? ?CSW talked with [place] explained what has been communicated with CSW per son report and requested they review.  Joellen Jersey states will review and follow up. ? ?TOC will continue to follow and assist with discharge planning. ? ?Thurmond Butts, MSW, LCSW ?Clinical Social Worker ? ? ? ?Expected Discharge Plan: [place] ?Barriers to Discharge: Continued Medical Work up ? ?Expected Discharge Plan and Services ?Expected Discharge Plan: [place] ?  ?  ?Post Acute Care Choice: [place] ?Living arrangements for the past 2 months: [place] ?                ?  ?  ?  ?  ?  ?  ?  ?  ?  ?  ? ? ?Social Determinants of Health (SDOH) Interventions ?  ? ?Readmission Risk Interventions ?   ? View : No data to display.  ?  ?  ?  ? ? ?

## 2022-01-20 NOTE — Assessment & Plan Note (Signed)
Oriented to self, person and place but not time.  Dependent for most ADL's. ?-Reorientation and delirium precautions. ?

## 2022-01-20 NOTE — Assessment & Plan Note (Addendum)
Per Dr. Alanda Slim: Extensive discussion about CODE STATUS and pros and cons of CPR and intubation with patient's son at bedside.  Patient has some cognitive impairment but oriented to self, person and place.  Although she does not have a good recollection of what happened, she seemed to comprehend the conversation.  After risk and benefit discussion, she does not want CPR or further intubation.  Patient's son is in agreement. ? ?

## 2022-01-20 NOTE — Care Management Important Message (Signed)
Important Message ? ?Patient Details  ?Name: Alexis Cortez ?MRN: [id number] ?Date of Birth: 01-21-1931 ? ? ?Medicare Important Message Given:  Yes ? ? ? ? ?Shelda Altes ?01/20/2022, 8:24 AM ?

## 2022-01-20 NOTE — Assessment & Plan Note (Signed)
Corrects to normal for hypoalbuminemia. ?

## 2022-01-20 NOTE — Assessment & Plan Note (Addendum)
Initially normotensive off home amlodipine and lisinopril. Now with increasing blood pressure ?-Continue holding both ?-Hydralazine PO for now; restart home medication if blood pressure continues to be elevated and/or patient is requiring hydralazine PRN for blood pressure control ?

## 2022-01-20 NOTE — Assessment & Plan Note (Addendum)
Limited TTE on 4/1 with probably mild to moderately decreased LVEF and dilated RV with what appears to be severely reduced RVSF but very limited window.  Overall, she appears euvolemic except for RLE swelling from infiltrated line.  Currently on room air.  No respiratory distress. ?-Monitor intake and output ?-Repeat Transthoracic Echocardiogram (complete) ?

## 2022-01-20 NOTE — Consult Note (Signed)
Initial Consultation Note ? ? ?Patient: Alexis Cortez [id number] DOB: 06/02/1931 PCP: Jettie Booze, NP ?DOA: 01/15/2022 ?DOS: the patient was seen and examined on 01/20/2022 ?Primary service: Edison Pace, Md, MD ? ?Referring physician: Erroll Luna, MD and Saverio Danker, PA ?Reason for consult: Medical management including hypocalcemia, respiratory failure, AKI ? ? ?Assessment and Plan: ?* Pneumoperitoneum/perforated sigmoid colon ?S/p sigmoid colectomy and end colostomy by Donne Hazel.  ?-Surgical wound care, JP drain and colostomy care per general surgery. ?-Remains on IV Zosyn per general surgery. ?-Pain control, DVT prophylaxis per general surgery. ?-Encourage incentive spirometry ? ?Goals of care, counseling/discussion ?Extensive discussion about CODE STATUS and pros and cons of CPR and intubation with patient's son at bedside.  Patient has some cognitive impairment but oriented to self, person and place.  Although she does not have a good recollection of what happened, she seemed to comprehend the conversation.  After risk and benefit discussion, she does not want CPR or further intubation.  Patient's son is in agreement. ?-CODE STATUS changed to DNR/DNI.  Surgical team notified. ? ?Acute respiratory failure ([place]) ?Patient required mechanical ventilation and ICU stay postoperatively.  Extubated on 4/3.  Currently on room air. ?-Encourage incentive spirometry ? ?AKI (acute kidney injury) ([place]) ?Recent Labs  ?  01/15/22 ?1704 01/16/22 ?[id number] 01/17/22 ?[id number] 01/18/22 ?[id number] 01/19/22 ?0325 01/20/22 ?[id number]  ?BUN 33* 32* 36* 32* 29* 18  ?CREATININE 1.77* 1.82* 2.18* 1.95* 1.62* 1.17*  ?Cr.  0.96 in 05/2020.  Resolving. ?-Monitor off IV fluid ?-Avoid nephrotoxic meds. ? ? ?Bacteremia ?Blood culture on 3/31 with bacteroids thetaiotaomicron.  Likely GI translocation. ?-Continue IV Zosyn ? ?Abnormal echocardiogram ?Limited TTE on 4/1 with probably mild to moderately decreased LVEF and dilated RV with what appears to be severely  reduced RVSF but very limited window.  Overall, she appears euvolemic except for RLE swelling from infiltrated line.  Currently on room air.  No respiratory distress. ?-Discontinue IV fluid ?-Monitor intake and output ? ? ?Hypocalcemia ?Corrects to normal for hypoalbuminemia. ? ?Thrombocytopenia ([place]) ?Recent Labs  ?Lab 01/15/22 ?1704 01/16/22 ?[id number] 01/17/22 ?[id number] 01/18/22 ?[id number] 01/19/22 ?0325 01/20/22 ?[id number]  ?PLT 172 222 196 154 116* 116*  ?-Gradual drop.  Likely due to acute illness. ?-Continue monitoring ? ? ?Hypoalbuminemia ?Check prealbumin ? ?Normocytic anemia ?Recent Labs  ?  01/15/22 ?1704 01/16/22 ?[id number] 01/16/22 ?[id number] 01/17/22 ?0323 01/17/22 ?1132 01/18/22 ?[id number] 01/18/22 ?[id number] 01/19/22 ?0325 01/20/22 ?[id number]  ?HGB 14.2 14.3 14.3 14.2 13.6 11.3* 11.2* 10.1* 10.6*  ?Stable after initial drop.  Likely due to acute illness and some surgical blood loss. ?-Check anemia panel ?-Monitor ? ? ?Hyponatremia ?Appears euvolemic except for RLE swelling from infiltrated IV ?-Recheck in the morning. ?-Discontinue LR and dextrose. ? ?Cognitive impairment ?Oriented to self, person and place but not time.  Dependent for most ADL's. ?-Reorientation and delirium precautions. ? ?Essential hypertension ?Normotensive off home amlodipine and lisinopril. ?-Continue holding both ?-Discontinue IV LR ? ?Septic shock ([place]) ?Likely due to perforated bowel and bacteremia.  Resolved. ? ? ?TRH will continue to follow the patient. ? ?HPI: Alexis Cortez ?88 year old female with history of HTN, anxiety, cognitive impairment and ambulatory dysfunction with rolling walker dependence presenting with abdominal pain, constipation, poor p.o. intake and admitted by general surgery on 01/15/2022 with working diagnosis of pneumoperitoneum as noted on CT abdomen and pelvis.  She was started on IV Zosyn.  She underwent sigmoid colectomy with end colostomy on 4/1 for perforated sigmoid colon with peritonitis.  She also have JP drain placed.  She had postop  acute respiratory failure and septic shock and transferred to ICU on mechanical ventilator.   ? ?Patient was extubated on 01/18/2022.  She was transferred to progressive care on 01/20/2022.  Hospitalist service consulted for assistance with medical management including respiratory failure, electrolyte abnormalities, AKI and possible CHF. ? ?Patient was on 2 L by [place] saturating at 100%.  Other vitals within normal limit.  Na 132. Cr 1.17 (improved).  Calcium 7.2 but albumin <1.5.  Hgb 10.6.  Platelet 116.  Otherwise, BMP and CBC without significant finding.   ? ?At the time of my evaluation, patient was resting comfortably in bed.  Patient's son at bedside.  She is awake and alert.  She is oriented to self, place and family but not time.  She has no complaints.  She denies pain, shortness of breath, cough, urinary symptoms or focal neuro symptoms.  Denies lightheadedness or focal neuro symptoms.  However, she has no recollection into why she was in the hospital.  Per patient's son, she lives at [place].  Some concern about dementia/cognitive decline based on recent test at ALF.  She uses rolling walker and needs assistance with ADLs including feeding lately.  She stopped eating and drinking for 2 to 3 weeks prior to presentation.  She had problem with constipation.  Patient and patient's son denies history of heart disease, CHF, diabetes, kidney disease or lung disease. ? ?Patient smokes cigarettes when she was young.  Admits to occasional wine.  Denies recreational drug use.  She prefers to be DNR/DNI after lengthy discussion about CODE STATUS and risk and benefits of CPR and intubation.  Patient's son who is HCPOA in agreement. ? ? ? ?  ? ?Review of Systems: As mentioned in the history of present illness. All other systems reviewed and are negative. ?Past Medical History:  ?Diagnosis Date  ? Hypertension   ? Hypertensive retinopathy   ? OU  ? Macular degeneration   ? Wet OD, Dry OS  ? ?Past Surgical History:  ?Procedure  Laterality Date  ? CATARACT EXTRACTION Bilateral   ? COLECTOMY N/A 01/16/2022  ? Procedure: SIGMOID COLECTOMY;  Surgeon: Rolm Bookbinder, MD;  Location: [place];  Service: General;  Laterality: N/A;  ? COLOSTOMY  01/16/2022  ? Procedure: COLOSTOMY;  Surgeon: Rolm Bookbinder, MD;  Location: [place];  Service: General;;  ? EYE SURGERY    ? YAG LASER APPLICATION Right   ? ?Social History:  reports that she has never smoked. She has never used smokeless tobacco. She reports current alcohol use of about 2.0 standard drinks per week. She reports that she does not use drugs. ? ?No Known Allergies ? ?History reviewed. No pertinent family history. ? ?Prior to Admission medications   ?Medication Sig Start Date End Date Taking? Authorizing Provider  ?acetaminophen (TYLENOL) 500 MG tablet Take 500 mg by mouth every 6 (six) hours as needed for mild pain, fever or headache.   Yes [provider]  ?aluminum-magnesium hydroxide-simethicone (MAALOX) [id number] MG/5ML SUSP Take 30 mLs by mouth every 6 (six) hours as needed (heartburn/indigestion).   Yes [provider]  ?bisacodyl (DULCOLAX) 10 MG suppository Place 10 mg rectally See admin instructions. 1 suppository every 3 days as needed for constipation   Yes [provider]  ?busPIRone (BUSPAR) 7.5 MG tablet Take 7.5 mg by mouth 2 (two) times daily.  04/25/19  Yes [provider]  ?guaiFENesin (ROBITUSSIN) 100 MG/5ML liquid Take 10 mLs by mouth every 6 (six) hours as  needed for cough or to loosen phlegm.   Yes [provider]  ?levothyroxine (SYNTHROID) 75 MCG tablet Take 75 mcg by mouth daily. 09/27/19  Yes [provider]  ?lisinopril (ZESTRIL) 10 MG tablet Take 10 mg by mouth daily.    Yes [provider]  ?loperamide (IMODIUM) 2 MG capsule Take 2 mg by mouth as needed for diarrhea or loose stools.   Yes [provider]  ?magnesium hydroxide (MILK OF MAGNESIA) 400 MG/5ML suspension Take 30 mLs by mouth at  bedtime as needed for mild constipation.   Yes [provider]  ?melatonin 3 MG TABS tablet Take 3 mg by mouth at bedtime.   Yes [provider]  ?mirtazapine (REMERON) 15 MG tablet Take 15

## 2022-01-20 NOTE — [place] FL2 (Signed)
?[place] MEDICAID FL2 LEVEL OF CARE SCREENING TOOL  ?  ? ?IDENTIFICATION  ?Patient Name: ?Alexis Cortez Birthdate: January 09, 1931 Sex: female Admission Date (Current Location): ?01/15/2022  ?[place] and [place] Number: ? [place] ?  Facility and Address:  ?[facility], [place] [address], [place], [place] [address] ?     Provider Number: ?[id number]  ?Attending Physician Name and Address:  ?Ccs, Md, MD ? Relative Name and Phone Number:  ?[name], [name])   [phone] [facility]) ?   ?Current Level of Care: ?Hospital Recommended Level of Care: ?[place] Prior Approval Number: ?  ? ?Date Approved/Denied: ?  PASRR Number: ?[id number] A ? ?Discharge Plan: ?SNF ?  ? ?Current Diagnoses: ?Patient Active Problem List  ? Diagnosis Date Noted  ? Essential hypertension 01/20/2022  ? Cognitive impairment 01/20/2022  ? Hyponatremia 01/20/2022  ? Normocytic anemia 01/20/2022  ? Hypoalbuminemia 01/20/2022  ? Thrombocytopenia ([place]) 01/20/2022  ? Hypocalcemia 01/20/2022  ? Physical deconditioning 01/20/2022  ? Goals of care, counseling/discussion 01/20/2022  ? Abnormal echocardiogram 01/20/2022  ? Bacteremia 01/20/2022  ? Perforated ulcer ([place]) 01/16/2022  ? Septic shock ([place])   ? AKI (acute kidney injury) ([place])   ? Acute respiratory failure ([place])   ? Pneumoperitoneum/perforated sigmoid colon 01/15/2022  ? Sinus bradycardia   ? COVID-19 virus infection 10/05/2019  ? ? ?Orientation RESPIRATION BLADDER Height & Weight   ?  ?Self, Place ? O2 (2L [place]) External catheter Weight: 150 lb (68 kg) ?Height:  _0  (157.5 cm)  ?BEHAVIORAL SYMPTOMS/MOOD NEUROLOGICAL BOWEL NUTRITION STATUS  ?    Colostomy, Incontinent Diet (See d/c summary)  ?AMBULATORY STATUS COMMUNICATION OF NEEDS Skin   ?Extensive Assist Verbally Surgical wounds (Incision abdomen; JP Drain abdomen) ?  ?  ?  ?    ?     ?     ? ? ?Personal Care Assistance Level of Assistance  ?Bathing, Feeding, Dressing Bathing Assistance: Maximum  assistance ?Feeding assistance: Independent ?Dressing Assistance: Maximum assistance ?   ? ?Functional Limitations Info  ?Sight, Hearing, Speech Sight Info: Adequate ?Hearing Info: Adequate ?Speech Info: Adequate  ? ? ?SPECIAL CARE FACTORS FREQUENCY  ?PT (By licensed PT), OT (By licensed OT)   ?  ?PT Frequency: 5x/week ?OT Frequency: 5x/week ?  ?  ?  ?   ? ? ?Contractures Contractures Info: Not present  ? ? ?Additional Factors Info  ?Code Status, Allergies Code Status Info: DNR ?Allergies Info: no known allergies ?  ?  ?  ?   ? ?Current Medications (01/20/2022):  This is the current hospital active medication list ?Current Facility-Administered Medications  ?Medication Dose Route Frequency Provider Last Rate Last Admin  ? 0.9 %  sodium chloride infusion   Intravenous Continuous Deland Pretty, MD 10 mL/hr at 01/19/22 1500 Infusion Verify at 01/19/22 1500  ? 0.9 %  sodium chloride infusion  250 mL Intravenous Continuous Deland Pretty, MD      ? chlorhexidine gluconate (MEDLINE KIT) (PERIDEX) 0.12 % solution 15 mL  15 mL Mouth Rinse BID Rolm Bookbinder, MD   15 mL at 01/20/22 0844  ? Chlorhexidine Gluconate Cloth 2 % PADS 6 each  6 each Topical Q0600 Rolm Bookbinder, MD   6 each at 01/20/22 0534  ? heparin injection 5,000 Units  5,000 Units Subcutaneous Q8H Rolm Bookbinder, MD   5,000 Units at 01/20/22 1419  ? HYDROmorphone (DILAUDID) injection 0.5 mg  0.5 mg Intravenous Q2H PRN Rolm Bookbinder, MD   0.5 mg at  01/16/22 1940  ? MEDLINE mouth rinse  15 mL Mouth Rinse QID Jacky Kindle, MD   15 mL at 01/20/22 1310  ? ondansetron (ZOFRAN-ODT) disintegrating tablet 4 mg  4 mg Oral Q6H PRN Rolm Bookbinder, MD      ? Or  ? ondansetron [facility]) injection 4 mg  4 mg Intravenous Q6H PRN Rolm Bookbinder, MD   4 mg at 01/15/22 2044  ? ondansetron (ZOFRAN-ODT) disintegrating tablet 4 mg  4 mg Oral Q6H PRN Rolm Bookbinder, MD      ? Or  ? ondansetron [facility]) injection 4 mg  4 mg Intravenous Q6H  PRN Rolm Bookbinder, MD      ? piperacillin-tazobactam (ZOSYN) IVPB 3.375 g  3.375 g Intravenous Q8H Pauletta Browns, RPH 12.5 mL/hr at 01/20/22 1422 3.375 g at 01/20/22 1422  ? sodium chloride flush (NS) 0.9 % injection 10-40 mL  10-40 mL Intracatheter Q12H Jacky Kindle, MD   10 mL at 01/20/22 0845  ? sodium chloride flush (NS) 0.9 % injection 10-40 mL  10-40 mL Intracatheter PRN Jacky Kindle, MD      ? ? ? ?Discharge Medications: ?Please see discharge summary for a list of discharge medications. ? ?Relevant Imaging Results: ? ?Relevant Lab Results: ? ? ?Additional Information ?SS# [id number] ? ?[place], LCSW ? ? ? ? ?

## 2022-01-20 NOTE — Progress Notes (Signed)
Physical Therapy Treatment ?Patient Details ?Name: Alexis Cortez ?MRN: [id number] ?DOB: 1931/01/11 ?Today's Date: 01/20/2022 ? ? ?History of Present Illness Pt adm 3/31 with abdominal pain. Pt found to have stercoral ulcer with perforation and underwent colectomy and colostomy on 4/1. Pt remained on vent until extubated on 4/3.  PMH - HTN, macular degeneration, dementia. ? ?  ?PT Comments  ? ? Pt received in supine, agreeable to therapy session with encouragement, with fair participation and tolerance for bed mobility and transfer training. Pt limited due to c/o moderate to severe abdominal pain with mobility and needing up to +2 modA for bed mobility and sit<>stand from EOB with +2 hand-held assist. Pt in too much pain and too fatigued upon standing to progress gait, and refused OOB transfer to chair. Pt up in chair posture in bed with food tray set up and son in room at end of session. Pt continues to benefit from PT services to progress toward functional mobility goals. Plan to assess OOB to chair next session if appropriate.  ?Recommendations for follow up therapy are one component of a multi-disciplinary discharge planning process, led by the attending physician.  Recommendations may be updated based on patient status, additional functional criteria and insurance authorization. ? ?Follow Up Recommendations ? Skilled nursing-short term rehab (<3 hours/day) ?  ?  ?Assistance Recommended at Discharge Frequent or constant Supervision/Assistance  ?Patient can return home with the following Two people to help with walking and/or transfers;Assist for transportation ?  ?Equipment Recommendations ? None recommended by PT  ?  ?Recommendations for Other Services   ? ? ?  ?Precautions / Restrictions Precautions ?Precautions: Fall;Other (comment) ?Precaution Comments: colostomy, JP drain, abdominal discomfort ?Restrictions ?Weight Bearing Restrictions: No  ?  ? ?Mobility ? Bed Mobility ?Overal bed mobility: Needs  Assistance ?Bed Mobility: Rolling, Sidelying to Sit, Sit to Sidelying ?Rolling: Max assist ?Sidelying to sit: +2 for physical assistance, Mod assist, HOB elevated ?  ?  ?Sit to sidelying: Mod assist ?General bed mobility comments: Assist to bring legs off of bed, elevate trunk into sitting and bring hips to EOB. Assist to lower trunk and bring feet back up into bed. Pt reports she sleeps in a recliner at baseline. ?  ? ?Transfers ?Overall transfer level: Needs assistance ?Equipment used: 2 person hand held assist ?Transfers: Sit to/from Stand ?Sit to Stand: +2 physical assistance, Mod assist ?  ?  ?  ?  ?  ?General transfer comment: Assist to bring hips up and for balance; pt quick to fatigue ?  ? ?Ambulation/Gait ?Ambulation/Gait assistance: +2 physical assistance, Min assist ?  ?Assistive device: 2 person hand held assist ?  ?  ?  ?Pre-gait activities: attempt at sidesteps along EOB but pt only able to perform single shuffled step toward L side then sits impulsively due to pain/fatigue ?  ? ? ?Stairs ?  ?  ?  ?  ?  ? ? ?Wheelchair Mobility ?  ? ?Modified Rankin (Stroke Patients Only) ?  ? ? ?  ?Balance Overall balance assessment: Needs assistance ?Sitting-balance support: No upper extremity supported, Feet supported ?Sitting balance-Leahy Scale: Fair ?  ?  ?Standing balance support: Bilateral upper extremity supported ?Standing balance-Leahy Scale: Poor ?Standing balance comment: +2 external support (HHA) without AD ?  ?  ?  ?  ?  ?  ?  ?  ?  ?  ?  ?  ? ?  ?Cognition Arousal/Alertness: Awake/alert ?Behavior During Therapy: Flat affect ?Overall Cognitive Status: No family/caregiver  present to determine baseline cognitive functioning ?Area of Impairment: Orientation, Attention, Memory, Following commands, Safety/judgement, Problem solving, Awareness ?  ?  ?  ?  ?  ?  ?  ?  ?Orientation Level: Disoriented to, Situation, Time ?Current Attention Level: Sustained ?Memory: Decreased short-term memory ?Following Commands:  Follows one step commands with increased time ?  ?Awareness: Intellectual ?Problem Solving: Slow processing, Requires verbal cues, Difficulty sequencing, Requires tactile cues ?General Comments: Pt with baseline dementia so likely not far from baseline; follows directions consistently with increased processing time. Son entering room at end of session and given info on session progress and pt tolerance, receptive to all feedback. ?  ?  ? ?  ?Exercises   ? ?  ?General Comments General comments (skin integrity, edema, etc.): HR 81 bpm resting and to 90's bpm with exertion; SpO2 100% on RA ?  ?  ? ?Pertinent Vitals/Pain Pain Assessment ?Pain Assessment: 0-10 ?Faces Pain Scale: Hurts even more ?Pain Location: abdomen with mobility, pt states "8/10" but appears closer to 4-6/10 with s/sx ?Pain Descriptors / Indicators: Grimacing, Guarding, Discomfort ?Pain Intervention(s): Limited activity within patient's tolerance, Monitored during session, Premedicated before session, Repositioned (reviewed manual splinting technique with pillows/hands)  ? ? ?   ?   ? ?PT Goals (current goals can now be found in the care plan section) Acute Rehab PT Goals ?Patient Stated Goal: to move more, less pain ?PT Goal Formulation: With patient ?Time For Goal Achievement: 02/02/22 ?Progress towards PT goals: Progressing toward goals ? ?  ?Frequency ? ? ? Min 2X/week ? ? ? ?  ?PT Plan Current plan remains appropriate  ? ? ?   ?AM-PAC PT "6 Clicks" Mobility   ?Outcome Measure ? Help needed turning from your back to your side while in a flat bed without using bedrails?: A Lot ?Help needed moving from lying on your back to sitting on the side of a flat bed without using bedrails?: A Lot ?Help needed moving to and from a bed to a chair (including a wheelchair)?: Total ?Help needed standing up from a chair using your arms (e.g., wheelchair or bedside chair)?: A Lot ?Help needed to walk in hospital room?: Total ?Help needed climbing 3-5 steps with a  railing? : Total ?6 Click Score: 9 ? ?  ?End of Session   ?Activity Tolerance: Patient limited by pain;Patient limited by fatigue ?Patient left: in bed;with call bell/phone within reach;with bed alarm set;with family/visitor present (son present; pt in chair posture in bed to eat dinner) ?Nurse Communication: Mobility status;Need for lift equipment;Other (comment);Patient requests pain meds (may need Stedy for OOB with +2) ?PT Visit Diagnosis: Other abnormalities of gait and mobility (R26.89);Muscle weakness (generalized) (M62.81) ?  ? ? ?Time: [id number] ?PT Time Calculation (min) (ACUTE ONLY): 18 min ? ?Charges:  $Therapeutic Activity: 8-22 mins          ?          ? ?Corie Allis P., PTA ?Acute Rehabilitation Services ?Secure Chat Preferred 9a-5:30pm ?Office: [phone]  ? ? ?Kara Pacer Eldrick Penick ?01/20/2022, 5:53 PM ? ?

## 2022-01-20 NOTE — Care Management Important Message (Signed)
Important Message ? ?Patient Details  ?Name: Alexis Cortez ?MRN: [id number] ?Date of Birth: May 11, 1931 ? ? ?Medicare Important Message Given:  Yes ? ? ? ? ?Renie Ora ?01/20/2022, 11:09 AM ?

## 2022-01-20 NOTE — Assessment & Plan Note (Addendum)
-  Encourage PO intake ?-Dietitian consult ?

## 2022-01-20 NOTE — Progress Notes (Addendum)
? ? ?4 Days Post-Op  ?Subjective: ?Some repetitive questions this morning.  Seems to be tolerating her CLD with no nausea or issues.  Still no air in her bag.  Asking about why she is here and where she is going to go from here.  ? ?ROS: see HPI ? ?Objective: ?Vital signs in last 24 hours: ?Temp:  [97.5 ?F (36.4 ?C)-97.8 ?F (36.6 ?C)] 97.6 ?F (36.4 ?C) (04/05 0740) ?Pulse Rate:  [58-74] 66 (04/05 0740) ?Resp:  [11-19] 15 (04/05 0740) ?BP: (103-129)/(46-82) 123/73 (04/05 0740) ?SpO2:  [100 %] 100 % (04/05 0740) ?Last BM Date : 01/19/22 ? ?Intake/Output from previous day: ?04/04 0701 - 04/05 0700 ?In: 1384.5 [I.V.:779.6; IV [id number] ?Out: 2880 [Urine:2550; Drains:330] ?Intake/Output this shift: ?No intake/output data recorded. ? ?PE: ?Heart: regular ?Lungs: CTAB ?Abd: soft, appropriately tender, colostomy with viable stoma, no output yet.  Midline wound is clean and packed.  Packing removed and wound intact.  JP with serosang output, 330cc yesterday.  ? ? ?Lab Results:  ?Recent Labs  ?  01/19/22 ?0325 01/20/22 ?[id number]  ?WBC 5.9 4.7  ?HGB 10.1* 10.6*  ?HCT 29.7* 31.5*  ?PLT 116* 116*  ? ?BMET ?Recent Labs  ?  01/19/22 ?0325 01/20/22 ?[id number]  ?NA 132* 132*  ?K 3.4* 3.9  ?CL 102 103  ?CO2 21* 24  ?GLUCOSE 120* 103*  ?BUN 29* 18  ?CREATININE 1.62* 1.17*  ?CALCIUM 6.6* 7.2*  ? ?PT/INR ?No results for input(s): LABPROT, INR in the last 72 hours. ?CMP  ?   ?Component Value Date/Time  ? NA 132 (L) 01/20/2022 0549  ? K 3.9 01/20/2022 0549  ? CL 103 01/20/2022 0549  ? CO2 24 01/20/2022 0549  ? GLUCOSE 103 (H) 01/20/2022 0549  ? BUN 18 01/20/2022 0549  ? CREATININE 1.17 (H) 01/20/2022 0549  ? CALCIUM 7.2 (L) 01/20/2022 0549  ? PROT 6.2 (L) 01/15/2022 1704  ? ALBUMIN 3.4 (L) 01/15/2022 1704  ? AST 28 01/15/2022 1704  ? ALT 13 01/15/2022 1704  ? ALKPHOS 62 01/15/2022 1704  ? BILITOT 1.0 01/15/2022 1704  ? GFRNONAA 45 (L) 01/20/2022 0549  ? GFRAA >60 10/10/2019 0307  ? ?Lipase  ?   ?Component Value Date/Time  ? LIPASE 37  01/15/2022 1704  ? ? ? ? ? ?Studies/Results: ?No results found. ? ?Anti-infectives: ?Anti-infectives (From admission, onward)  ? ? Start     Dose/Rate Route Frequency Ordered Stop  ? 01/19/22 1400  piperacillin-tazobactam (ZOSYN) IVPB 3.375 g       ? 3.375 g ?12.5 mL/hr over 240 Minutes Intravenous Every 8 hours 01/19/22 0842 01/23/22 1359  ? 01/17/22 1400  piperacillin-tazobactam (ZOSYN) IVPB 2.25 g  Status:  Discontinued       ? 2.25 g ?100 mL/hr over 30 Minutes Intravenous Every 8 hours 01/17/22 0725 01/19/22 0842  ? 01/16/22 1800  piperacillin-tazobactam (ZOSYN) IVPB 2.25 g  Status:  Discontinued       ? 2.25 g ?100 mL/hr over 30 Minutes Intravenous Every 6 hours 01/16/22 1420 01/17/22 0725  ? 01/16/22 1000  piperacillin-tazobactam (ZOSYN) IVPB 3.375 g  Status:  Discontinued       ? 3.375 g ?12.5 mL/hr over 240 Minutes Intravenous Every 12 hours 01/15/22 2129 01/16/22 1420  ? 01/15/22 1945  piperacillin-tazobactam (ZOSYN) IVPB 3.375 g       ? 3.375 g ?100 mL/hr over 30 Minutes Intravenous  Once 01/15/22 1939 01/15/22 2045  ? ?  ? ? ? ?Assessment/Plan ?POD 4, s/p  ex lap  with Hartmann's procedure by Dr. Donne Hazel 01/16/22 for stercoral ulcer perforation ?-no nausea, cont CLD until she has bowel function ?-cont abx therapy for 5 days ?-NS WD dressing changes to abdominal wound BID ?-WOC consult for new colostomy, appreciate their assistance ?-cont JP drain  ?-PT/OT recommending SNF, will consult TOC ? ?FEN - CLD/IVFs/hypocalcemia  - replaced today ?VTE - heparin ?ID - zosyn ? ?Acute hypoxic respiratory failure - extubated and off O2 ?Septic shock - resolved ?AKI - improving.  Cr down to 1.17 and making urine ?Hypothyroidism - on synthroid ?Dementia - repetitive today, but pleasant ?HF ? ?CCM has signed off.  Will ask medicine to consult on her to help [place] out with any medical issues during her stay.  Appreciate their assistance. ? ?Called son, [name] for update, but got voicemail ? LOS: 5 days  ? ? ?Henreitta Cea ,  PA-C ?[facility] ?01/20/2022, 9:13 AM ?Please see Amion for pager number during day hours 7:00am-4:30pm or 7:00am -11:30am on weekends ? ?

## 2022-01-20 NOTE — Assessment & Plan Note (Signed)
Patient required mechanical ventilation and ICU stay postoperatively.  Extubated on 4/3.  Currently on room air. ?-Encourage incentive spirometry ?

## 2022-01-20 NOTE — Assessment & Plan Note (Addendum)
Likely secondary to acute illness. Now resolved. ?

## 2022-01-20 NOTE — Hospital Course (Addendum)
88 year old female with history of HTN, anxiety, cognitive impairment and ambulatory dysfunction with rolling walker dependence presenting with abdominal pain, constipation, poor p.o. intake and admitted by general surgery on 01/15/2022 with working diagnosis of pneumoperitoneum as noted on CT abdomen and pelvis.  She was started on IV Zosyn.  She underwent sigmoid colectomy with end colostomy on 4/1 for perforated sigmoid colon with peritonitis. Hospitalist consulted for medical care.  Not progressing well.  Discussed with son who is open to palliative care consult. ? ? ?

## 2022-01-20 NOTE — Assessment & Plan Note (Addendum)
Likely due to perforated bowel and bacteremia.  Resolved. ?

## 2022-01-20 NOTE — Plan of Care (Signed)

## 2022-01-20 NOTE — Assessment & Plan Note (Addendum)
-  Cr.  0.96 in 05/2020 ?-Avoid nephrotoxic meds. ? ?

## 2022-01-21 DIAGNOSIS — K668 Other specified disorders of peritoneum: Secondary | ICD-10-CM | POA: Diagnosis not present

## 2022-01-21 LAB — PREALBUMIN: Prealbumin: <5 mg/dL — ABNORMAL LOW (ref 18–38)

## 2022-01-21 LAB — COMPREHENSIVE METABOLIC PANEL
ALT: 94 U/L — ABNORMAL HIGH (ref 0–44)
AST: 153 U/L — ABNORMAL HIGH (ref 15–41)
Albumin: <1.5 g/dL — ABNORMAL LOW (ref 3.5–5.0)
Alkaline Phosphatase: 218 U/L — ABNORMAL HIGH (ref 38–126)
Anion gap: 7 (ref 5–15)
BUN: 15 mg/dL (ref 8–23)
CO2: 21 mmol/L — ABNORMAL LOW (ref 22–32)
Calcium: 7.3 mg/dL — ABNORMAL LOW (ref 8.9–10.3)
Chloride: 105 mmol/L (ref 98–111)
Creatinine, Ser: 1.08 mg/dL — ABNORMAL HIGH (ref 0.44–1.00)
GFR, Estimated: 49 mL/min — ABNORMAL LOW (ref >60)
Glucose, Bld: 71 mg/dL (ref 70–99)
Potassium: 4.1 mmol/L (ref 3.5–5.1)
Sodium: 133 mmol/L — ABNORMAL LOW (ref 135–145)
Total Bilirubin: 1 mg/dL (ref 0.3–1.2)
Total Protein: 3.9 g/dL — ABNORMAL LOW (ref 6.5–8.1)

## 2022-01-21 LAB — CBC
HCT: 32.8 % — ABNORMAL LOW (ref 36.0–46.0)
Hemoglobin: 11 g/dL — ABNORMAL LOW (ref 12.0–15.0)
MCH: 29.7 pg (ref 26.0–34.0)
MCHC: 33.5 g/dL (ref 30.0–36.0)
MCV: 88.6 fL (ref 80.0–100.0)
Platelets: 97 10*3/uL — ABNORMAL LOW (ref 150–400)
RBC: 3.7 MIL/uL — ABNORMAL LOW (ref 3.87–5.11)
RDW: 14.2 % (ref 11.5–15.5)
WBC: 4.9 10*3/uL (ref 4.0–10.5)
nRBC: 0.4 % — ABNORMAL HIGH (ref 0.0–0.2)

## 2022-01-21 LAB — GLUCOSE, CAPILLARY
Glucose-Capillary: 69 mg/dL — ABNORMAL LOW (ref 70–99)
Glucose-Capillary: 73 mg/dL (ref 70–99)
Glucose-Capillary: 81 mg/dL (ref 70–99)
Glucose-Capillary: 63 mg/dL — ABNORMAL LOW (ref 70–99)
Glucose-Capillary: 71 mg/dL (ref 70–99)
Glucose-Capillary: 100 mg/dL — ABNORMAL HIGH (ref 70–99)

## 2022-01-21 LAB — PHOSPHORUS: Phosphorus: 2.3 mg/dL — ABNORMAL LOW (ref 2.5–4.6)

## 2022-01-21 LAB — BRAIN NATRIURETIC PEPTIDE: B Natriuretic Peptide: 293.3 pg/mL — ABNORMAL HIGH (ref 0.0–100.0)

## 2022-01-21 LAB — MAGNESIUM: Magnesium: 1.8 mg/dL (ref 1.7–2.4)

## 2022-01-21 MED ORDER — ACETAMINOPHEN 500 MG PO TABS
1000.0000 mg | ORAL_TABLET | Freq: Four times a day (QID) | ORAL | Status: DC
Start: 2022-01-21 — End: 2022-01-29
  Administered 2022-01-21 – 2022-01-29 (×23): 1000 mg via ORAL
  Filled 2022-01-21 (×30): qty 2

## 2022-01-21 MED ORDER — MIRTAZAPINE 15 MG PO TABS
15.0000 mg | ORAL_TABLET | Freq: Every day | ORAL | Status: DC
  Administered 2022-01-21 – 2022-01-28 (×8): 15 mg via ORAL
  Filled 2022-01-21 (×8): qty 1

## 2022-01-21 MED ORDER — METHOCARBAMOL 500 MG PO TABS
500.0000 mg | ORAL_TABLET | Freq: Three times a day (TID) | ORAL | Status: DC | PRN
  Administered 2022-01-23 (×2): 500 mg via ORAL
  Filled 2022-01-21 (×2): qty 1

## 2022-01-21 MED ORDER — ENSURE ENLIVE PO LIQD
237.0000 mL | Freq: Two times a day (BID) | ORAL | Status: DC
  Administered 2022-01-21 – 2022-01-23 (×4): 237 mL via ORAL

## 2022-01-21 MED ORDER — HYDROMORPHONE HCL 1 MG/ML IJ SOLN
0.5000 mg | INTRAMUSCULAR | Status: DC | PRN
  Administered 2022-01-21 – 2022-01-25 (×6): 0.5 mg via INTRAVENOUS
  Filled 2022-01-21 (×6): qty 0.5

## 2022-01-21 MED ORDER — TRAMADOL HCL 50 MG PO TABS
50.0000 mg | ORAL_TABLET | Freq: Four times a day (QID) | ORAL | Status: DC | PRN
  Administered 2022-01-23 – 2022-01-27 (×3): 50 mg via ORAL
  Filled 2022-01-21 (×4): qty 1

## 2022-01-21 MED ORDER — LEVOTHYROXINE SODIUM 75 MCG PO TABS
75.0000 ug | ORAL_TABLET | Freq: Every day | ORAL | Status: DC
  Administered 2022-01-22 – 2022-01-28 (×7): 75 ug via ORAL
  Filled 2022-01-21 (×7): qty 1

## 2022-01-21 MED ORDER — ACETAMINOPHEN 325 MG PO TABS: 650.0000 mg | ORAL_TABLET | Freq: Four times a day (QID) | ORAL | Status: DC | PRN

## 2022-01-21 NOTE — Discharge Instructions (Addendum)
Wound Care ?Apply mepitel to base of midline incision. Keep this in place, and change weekly. Over top of this, please apply saline-moistened kerlex to gently pack the wound. Cover with ABDs and secure with tape. Change kerlex twice daily. ?

## 2022-01-21 NOTE — Progress Notes (Signed)
?PROGRESS NOTE ? ? ? ?Alexis Cortez  [id number] DOB: Sep 19, 1931 DOA: 01/15/2022 ?PCP: Jettie Booze, NP  ? ? ?Brief Narrative:  ?88 year old female with history of HTN, anxiety, cognitive impairment and ambulatory dysfunction with rolling walker dependence presenting with abdominal pain, constipation, poor p.o. intake and admitted by general surgery on 01/15/2022 with working diagnosis of pneumoperitoneum as noted on CT abdomen and pelvis.  She was started on IV Zosyn.  She underwent sigmoid colectomy with end colostomy on 4/1 for perforated sigmoid colon with peritonitis. Hospitalist consulted for medical care. ? ?  ? ? ?Assessment and Plan: ?* Pneumoperitoneum/perforated sigmoid colon ?S/p sigmoid colectomy and end colostomy by Donne Hazel.  ?-Surgical wound care, JP drain and colostomy care per general surgery. ?-Remains on IV Zosyn per general surgery. ?-Encourage incentive spirometry/mobility ?-tylenol scheduled ? ?Goals of care, counseling/discussion ?Per Dr. Cyndia Skeeters: Extensive discussion about CODE STATUS and pros and cons of CPR and intubation with patient's son at bedside.  Patient has some cognitive impairment but oriented to self, person and place.  Although she does not have a good recollection of what happened, she seemed to comprehend the conversation.  After risk and benefit discussion, she does not want CPR or further intubation.  Patient's son is in agreement. ? ? ?Acute respiratory failure ([place]) ?Patient required mechanical ventilation and ICU stay postoperatively.  Extubated on 4/3.  Currently on room air. ?-Encourage incentive spirometry ? ?AKI (acute kidney injury) ([place]) ?-Cr.  0.96 in 05/2020 ?-Avoid nephrotoxic meds. ? ? ?Bacteremia ?Blood culture on 3/31 with bacteroids thetaiotaomicron.  Likely GI translocation. ?-Continue IV Zosyn ? ?Abnormal echocardiogram ?Limited TTE on 4/1 with probably mild to moderately decreased LVEF and dilated RV with what appears to be severely reduced RVSF but  very limited window.  Overall, she appears euvolemic except for RLE swelling from infiltrated line.  Currently on room air.  No respiratory distress. ?-Discontinue IV fluid ?-Monitor intake and output ? ? ?Hypocalcemia ?Corrects to normal for hypoalbuminemia. ? ?Thrombocytopenia ([place]) ?-Gradual drop.  Likely due to acute illness. ?-Continue monitoring ?-may need to hold heparin products ? ?Hypoalbuminemia ?-encourage Po intake ? ?Normocytic anemia ?  Likely due to acute illness and some surgical blood loss. ?-Monitor ? ? ?Hyponatremia ?-BMP daily. ? ?Cognitive impairment ?Oriented to self, person and place but not time.  Dependent for most ADL's. ?-Reorientation and delirium precautions. ? ?Essential hypertension ?Normotensive off home amlodipine and lisinopril. ?-Continue holding both ? ? ?Septic shock ([place]) ?Likely due to perforated bowel and bacteremia.  Resolved. ? ? ? ? ?DVT prophylaxis: SCDs Start: 01/16/22 0337 ?heparin injection 5,000 Units Start: 01/15/22 2200 ? ?  Code Status: DNR ?Family Communication:  ? ?Disposition Plan:  ?Level of care: Telemetry Medical ?Status is: Inpatient ?Remains inpatient appropriate because: needs SNF placement ?  ? ? ?Subjective: ?Sleeping after IV dilaudid ? ?Objective: ?Vitals:  ? 01/20/22 2346 01/21/22 0357 01/21/22 0749 01/21/22 1139  ?BP: 136/62 (!) 101/52 (!) 114/51 98/68  ?Pulse: 93 86 81 79  ?Resp: 18 20 20 15   ?Temp: 98.7 ?F (37.1 ?C) 98.5 ?F (36.9 ?C) 98.2 ?F (36.8 ?C) 98.2 ?F (36.8 ?C)  ?TempSrc: Oral Oral Oral Oral  ?SpO2: 100% 100% 100% 97%  ?Weight:      ?Height:      ? ? ?Intake/Output Summary (Last 24 hours) at 01/21/2022 1158 ?Last data filed at 01/21/2022 [id number] ?Gross per 24 hour  ?Intake 298.43 ml  ?Output 2310 ml  ?Net -2011.57 ml  ? ?Filed Weights  ?  01/15/22 1617  ?Weight: 68 kg  ? ? ?Examination: ? ? ?General: Appearance:     ?Overweight female in no acute distress  ?   ?Lungs:     respirations unlabored  ?Heart:    Normal heart rate.  ?  ?MS:   All  extremities are intact.  ?  ?Neurologic:  sleeping  ?  ? ? ? ?Data Reviewed: I have personally reviewed following labs and imaging studies ? ?CBC: ?Recent Labs  ?Lab 01/15/22 ?1704 01/16/22 ?[id number] 01/17/22 ?[id number] 01/17/22 ?1132 01/18/22 ?[id number] 01/18/22 ?[id number] 01/19/22 ?0325 01/20/22 ?[id number] 01/21/22 ?0158  ?WBC 8.4   < > 11.7*  --  8.4  --  5.9 4.7 4.9  ?NEUTROABS 6.6  --   --   --   --   --   --   --   --   ?HGB 14.2   < > 14.2   < > 11.3* 11.2* 10.1* 10.6* 11.0*  ?HCT 44.7   < > 42.3   < > 33.3* 33.0* 29.7* 31.5* 32.8*  ?MCV 94.5   < > 89.4  --  87.9  --  88.7 88.7 88.6  ?PLT 172   < > 196  --  154  --  116* 116* 97*  ? < > = values in this interval not displayed.  ? ?Basic Metabolic Panel: ?Recent Labs  ?Lab 01/16/22 ?[id number] 01/16/22 ?[id number] 01/17/22 ?0323 01/17/22 ?1132 01/18/22 ?[id number] 01/18/22 ?[id number] 01/19/22 ?0325 01/19/22 ?[id number] 01/20/22 ?[id number] 01/20/22 ?0945 01/21/22 ?0158  ?NA 139   < > 135   < > 131* 130* 132*  --  132*  --  133*  ?K 3.5   < > 4.1   < > 3.9 3.7 3.4*  --  3.9  --  4.1  ?CL 118*  --  107  --  100  --  102  --  103  --  105  ?CO2 12*  --  15*  --  21*  --  21*  --  24  --  21*  ?GLUCOSE 180*  --  140*  --  78  --  120*  --  103*  --  71  ?BUN 32*  --  36*  --  32*  --  29*  --  18  --  15  ?CREATININE 1.82*  --  2.18*  --  1.95*  --  1.62*  --  1.17*  --  1.08*  ?CALCIUM 6.9*  --  6.0*  --  6.3*  --  6.6*  --  7.2*  --  7.3*  ?MG 1.8  --   --   --   --   --   --  1.4*  --  2.0 1.8  ?PHOS 3.0  --   --   --   --   --   --  2.5  --   --  2.3*  ? < > = values in this interval not displayed.  ? ?GFR: ?Estimated Creatinine Clearance: 32.6 mL/min (A) (by C-G formula based on SCr of 1.08 mg/dL (H)). ?Liver Function Tests: ?Recent Labs  ?Lab 01/15/22 ?1704 01/20/22 ?0945 01/21/22 ?0158  ?AST 28  --  153*  ?ALT 13  --  94*  ?ALKPHOS 62  --  218*  ?BILITOT 1.0  --  1.0  ?PROT 6.2*  --  3.9*  ?ALBUMIN 3.4* <1.5* <1.5*  ? ?Recent Labs  ?Lab 01/15/22 ?1704  ?LIPASE 37  ? ?No results for input(s): AMMONIA in  the last 168  hours. ?Coagulation Profile: ?No results for input(s): INR, PROTIME in the last 168 hours. ?Cardiac Enzymes: ?No results for input(s): CKTOTAL, CKMB, CKMBINDEX, TROPONINI in the last 168 hours. ?BNP (last 3 results) ?No results for input(s): PROBNP in the last 8760 hours. ?HbA1C: ?No results for input(s): HGBA1C in the last 72 hours. ?CBG: ?Recent Labs  ?Lab 01/20/22 ?1947 01/20/22 ?2349 01/21/22 ?0359 01/21/22 ?[id number] 01/21/22 ?1138  ?GLUCAP 82 72 69* 73 81  ? ?Lipid Profile: ?No results for input(s): CHOL, HDL, LDLCALC, TRIG, CHOLHDL, LDLDIRECT in the last 72 hours. ?Thyroid Function Tests: ?No results for input(s): TSH, T4TOTAL, FREET4, T3FREE, THYROIDAB in the last 72 hours. ?Anemia Panel: ?Recent Labs  ?  01/20/22 ?0945 01/20/22 ?[id number] 01/20/22 ?0947  ?[id number]*  --   --   ?FOLATE  --   --  24.4  ?FERRITIN 381*  --   --   ?TIBC NOT CALCULATED  --   --   ?IRON 52  --   --   ?RETICCTPCT  --  1.0  --   ? ?Sepsis Labs: ?Recent Labs  ?Lab 01/15/22 ?1930 01/16/22 ?[id number]  ?LATICACIDVEN 3.3* 4.1*  ? ? ?Recent Results (from the past 240 hour(s))  ?Culture, blood (routine x 2)     Status: Abnormal  ? Collection Time: 01/15/22  7:38 PM  ? Specimen: BLOOD  ?Result Value Ref Range Status  ? Specimen Description BLOOD RIGHT ANTECUBITAL  Final  ? Special Requests   Final  ?  BOTTLES DRAWN AEROBIC AND ANAEROBIC Blood Culture adequate volume  ? Culture  Setup Time (A)  Final  ?  GRAM VARIABLE ROD ?ANAEROBIC BOTTLE ONLY ?CRITICAL RESULT CALLED TO, READ BACK BY AND VERIFIED WITH: PHARM D C.PIERCE ON [id number] AT [id number] BY E.PARRISH ?  ? Culture (A)  Final  ?  BACTEROIDES THETAIOTAOMICRON ?BETA LACTAMASE POSITIVE ?EGGERTHELLA LENTA ?Standardized susceptibility testing for this organism is not available. ?Performed at [facility], [place] [address], [place],  [address] ?  ? Report Status 01/20/2022 FINAL  Final  ?Blood Culture ID Panel (Reflexed)     Status: None  ? Collection Time: 01/15/22  7:38 PM  ?Result Value Ref  Range Status  ? Enterococcus faecalis NOT DETECTED NOT DETECTED Final  ? Enterococcus Faecium NOT DETECTED NOT DETECTED Final  ? Listeria monocytogenes NOT DETECTED NOT DETECTED Final  ? Staphylococcus species

## 2022-01-21 NOTE — Progress Notes (Addendum)
? ? ?5 Days Post-Op  ?Subjective: ?Mostly sleeping from recent dilaudid.  Some pain as expected.  Son at bedside.  Tolerating CLD with colostomy output.  No nausea. ? ?ROS: see HPI ? ?Objective: ?Vital signs in last 24 hours: ?Temp:  [98.2 ?F (36.8 ?C)-98.8 ?F (37.1 ?C)] 98.2 ?F (36.8 ?C) (04/06 0749) ?Pulse Rate:  [73-93] 81 (04/06 0749) ?Resp:  [17-21] 20 (04/06 0749) ?BP: (101-136)/(51-84) 114/51 (04/06 0749) ?SpO2:  [100 %] 100 % (04/06 0749) ?Last BM Date : 01/19/22 ? ?Intake/Output from previous day: ?04/05 0701 - 04/06 0700 ?In: 298.4 [P.O.:150; IV Piggyback:148.4] ?Out: 2350 [Urine:1700; Drains:650] ?Intake/Output this shift: ?Total I/O ?In: -  ?Out: 60 [Drains:60] ? ?PE: ?Heart: regular ?Lungs: CTAB ?Abd: soft, appropriately tender, colostomy with viable stoma, and feculent output.  Midline wound is clean and packed.  Packing removed and wound intact.  JP with serosang output, 650cc yesterday.  ? ? ?Lab Results:  ?Recent Labs  ?  01/20/22 ?4401 01/21/22 ?0158  ?WBC 4.7 4.9  ?HGB 10.6* 11.0*  ?HCT 31.5* 32.8*  ?PLT 116* 97*  ? ?BMET ?Recent Labs  ?  01/20/22 ?0272 01/21/22 ?0158  ?NA 132* 133*  ?K 3.9 4.1  ?CL 103 105  ?CO2 24 21*  ?GLUCOSE 103* 71  ?BUN 18 15  ?CREATININE 1.17* 1.08*  ?CALCIUM 7.2* 7.3*  ? ?PT/INR ?No results for input(s): LABPROT, INR in the last 72 hours. ?CMP  ?   ?Component Value Date/Time  ? NA 133 (L) 01/21/2022 0158  ? K 4.1 01/21/2022 0158  ? CL 105 01/21/2022 0158  ? CO2 21 (L) 01/21/2022 0158  ? GLUCOSE 71 01/21/2022 0158  ? BUN 15 01/21/2022 0158  ? CREATININE 1.08 (H) 01/21/2022 0158  ? CALCIUM 7.3 (L) 01/21/2022 0158  ? PROT 3.9 (L) 01/21/2022 0158  ? ALBUMIN <1.5 (L) 01/21/2022 0158  ? AST 153 (H) 01/21/2022 0158  ? ALT 94 (H) 01/21/2022 0158  ? ALKPHOS 218 (H) 01/21/2022 0158  ? BILITOT 1.0 01/21/2022 0158  ? GFRNONAA 49 (L) 01/21/2022 0158  ? GFRAA >60 10/10/2019 0307  ? ?Lipase  ?   ?Component Value Date/Time  ? LIPASE 37 01/15/2022 1704  ? ? ? ? ? ?Studies/Results: ?No  results found. ? ?Anti-infectives: ?Anti-infectives (From admission, onward)  ? ? Start     Dose/Rate Route Frequency Ordered Stop  ? 01/19/22 1400  piperacillin-tazobactam (ZOSYN) IVPB 3.375 g       ? 3.375 g ?12.5 mL/hr over 240 Minutes Intravenous Every 8 hours 01/19/22 0842 01/23/22 1359  ? 01/17/22 1400  piperacillin-tazobactam (ZOSYN) IVPB 2.25 g  Status:  Discontinued       ? 2.25 g ?100 mL/hr over 30 Minutes Intravenous Every 8 hours 01/17/22 0725 01/19/22 0842  ? 01/16/22 1800  piperacillin-tazobactam (ZOSYN) IVPB 2.25 g  Status:  Discontinued       ? 2.25 g ?100 mL/hr over 30 Minutes Intravenous Every 6 hours 01/16/22 1420 01/17/22 0725  ? 01/16/22 1000  piperacillin-tazobactam (ZOSYN) IVPB 3.375 g  Status:  Discontinued       ? 3.375 g ?12.5 mL/hr over 240 Minutes Intravenous Every 12 hours 01/15/22 2129 01/16/22 1420  ? 01/15/22 1945  piperacillin-tazobactam (ZOSYN) IVPB 3.375 g       ? 3.375 g ?100 mL/hr over 30 Minutes Intravenous  Once 01/15/22 1939 01/15/22 2045  ? ?  ? ? ? ?Assessment/Plan ?POD 5, s/p  ex lap with Hartmann's procedure by Dr. Dwain Sarna 01/16/22 for stercoral ulcer perforation ?-  no nausea, tolerating CLD.  Adv to FLD, add ensure ?-cont abx therapy for 5 days ?-NS WD dressing changes to abdominal wound BID ?-WOC consult for new colostomy, appreciate their assistance ?-cont JP drain  ?-PT/OT recommending SNF, TOC looking for placement  ?-discussed all of this with son at the bedside ? ?FEN - FLD/IVFs/ensure ?VTE - heparin, monitor platelets.  If go lower then stop heparin ?ID - zosyn ? ?Acute hypoxic respiratory failure - extubated and off O2 ?Septic shock - resolved ?AKI - improving.  Cr down to 1.06 and making urine ?Hypothyroidism - on synthroid ?Dementia - repetitive today, but pleasant ?HF ? ?Appreciate medicine's assistance ? LOS: 6 days  ? ? ?Letha Cape , PA-C ?[facility] ?01/21/2022, 11:23 AM ?Please see Amion for pager number during day hours 7:00am-4:30pm or  7:00am -11:30am on weekends ? ?

## 2022-01-22 ENCOUNTER — Inpatient Hospital Stay (HOSPITAL_COMMUNITY): Payer: Medicare PPO

## 2022-01-22 DIAGNOSIS — I50810 Right heart failure, unspecified: Secondary | ICD-10-CM

## 2022-01-22 DIAGNOSIS — R7881 Bacteremia: Secondary | ICD-10-CM | POA: Diagnosis not present

## 2022-01-22 DIAGNOSIS — R931 Abnormal findings on diagnostic imaging of heart and coronary circulation: Secondary | ICD-10-CM | POA: Diagnosis not present

## 2022-01-22 DIAGNOSIS — N179 Acute kidney failure, unspecified: Secondary | ICD-10-CM | POA: Diagnosis not present

## 2022-01-22 DIAGNOSIS — K668 Other specified disorders of peritoneum: Secondary | ICD-10-CM | POA: Diagnosis not present

## 2022-01-22 LAB — COMPREHENSIVE METABOLIC PANEL
ALT: 62 U/L — ABNORMAL HIGH (ref 0–44)
AST: 67 U/L — ABNORMAL HIGH (ref 15–41)
Albumin: <1.5 g/dL — ABNORMAL LOW (ref 3.5–5.0)
Alkaline Phosphatase: 202 U/L — ABNORMAL HIGH (ref 38–126)
Anion gap: 3 — ABNORMAL LOW (ref 5–15)
BUN: 17 mg/dL (ref 8–23)
CO2: 24 mmol/L (ref 22–32)
Calcium: 6.9 mg/dL — ABNORMAL LOW (ref 8.9–10.3)
Chloride: 106 mmol/L (ref 98–111)
Creatinine, Ser: 1.1 mg/dL — ABNORMAL HIGH (ref 0.44–1.00)
GFR, Estimated: 48 mL/min — ABNORMAL LOW (ref >60)
Glucose, Bld: 80 mg/dL (ref 70–99)
Potassium: 4.1 mmol/L (ref 3.5–5.1)
Sodium: 133 mmol/L — ABNORMAL LOW (ref 135–145)
Total Bilirubin: 0.5 mg/dL (ref 0.3–1.2)
Total Protein: 3.7 g/dL — ABNORMAL LOW (ref 6.5–8.1)

## 2022-01-22 LAB — CBC
HCT: 30 % — ABNORMAL LOW (ref 36.0–46.0)
Hemoglobin: 9.9 g/dL — ABNORMAL LOW (ref 12.0–15.0)
MCH: 29.6 pg (ref 26.0–34.0)
MCHC: 33 g/dL (ref 30.0–36.0)
MCV: 89.6 fL (ref 80.0–100.0)
Platelets: 163 10*3/uL (ref 150–400)
RBC: 3.35 MIL/uL — ABNORMAL LOW (ref 3.87–5.11)
RDW: 14.4 % (ref 11.5–15.5)
WBC: 7.8 10*3/uL (ref 4.0–10.5)
nRBC: 0.4 % — ABNORMAL HIGH (ref 0.0–0.2)

## 2022-01-22 LAB — ECHOCARDIOGRAM COMPLETE
AR max vel: 3.06 cm2
AV Area VTI: 2.98 cm2
AV Area mean vel: 2.84 cm2
AV Mean grad: 3 mmHg
AV Peak grad: 5.6 mmHg
Ao pk vel: 1.18 m/s
Area-P 1/2: 1.94 cm2
Height: 62 in
S' Lateral: 2.4 cm
Weight: 2400 oz

## 2022-01-22 LAB — GLUCOSE, CAPILLARY
Glucose-Capillary: 101 mg/dL — ABNORMAL HIGH (ref 70–99)
Glucose-Capillary: 70 mg/dL (ref 70–99)
Glucose-Capillary: 72 mg/dL (ref 70–99)
Glucose-Capillary: 73 mg/dL (ref 70–99)
Glucose-Capillary: 74 mg/dL (ref 70–99)
Glucose-Capillary: 76 mg/dL (ref 70–99)
Glucose-Capillary: 80 mg/dL (ref 70–99)

## 2022-01-22 LAB — TSH: TSH: 4.897 u[IU]/mL — ABNORMAL HIGH (ref 0.350–4.500)

## 2022-01-22 MED ORDER — HYDRALAZINE HCL 25 MG PO TABS
25.0000 mg | ORAL_TABLET | Freq: Four times a day (QID) | ORAL | Status: DC | PRN
Start: 2022-01-22 — End: 2022-01-28

## 2022-01-22 NOTE — Progress Notes (Signed)
Patient ID: Alexis Cortez, female   DOB: [birth year], 88 y.o.   MRN: [id number] ?[facility] Progress Note:   6 Days Post-Op  ?Subjective: ?Mental status is awake and answers questions although somewhat quiet.  Complaints none. ?Objective: ?Vital signs in last 24 hours: ?Temp:  [97.6 ?F (36.4 ?C)-98.8 ?F (37.1 ?C)] 97.9 ?F (36.6 ?C) (04/07 [id number]) ?Pulse Rate:  [62-79] 62 (04/07 0930) ?Resp:  [12-18] 12 (04/07 0930) ?BP: (98-127)/(51-68) 127/56 (04/07 [id number]) ?SpO2:  [97 %-100 %] 100 % (04/07 0930) ? ?Intake/Output from previous day: ?04/06 0701 - 04/07 0700 ?In: 740 [P.O.:580; I.V.:10; IV Piggyback:150] ?Out: 990 [Urine:350; Drains:490; Stool:150] ?Intake/Output this shift: ?Total I/O ?In: -  ?Out: 2455 [Drains:55] ? ?Physical Exam: Work of breathing is OK.  Taking full liquids ? ?Lab Results:  ?Results for orders placed or performed during the hospital encounter of 01/15/22 (from the past 48 hour(s))  ?Glucose, capillary     Status: Abnormal  ? Collection Time: 01/20/22 11:51 AM  ?Result Value Ref Range  ? Glucose-Capillary 105 (H) 70 - 99 mg/dL  ?  Comment: Glucose reference range applies only to samples taken after fasting for at least 8 hours.  ?Glucose, capillary     Status: None  ? Collection Time: 01/20/22  4:04 PM  ?Result Value Ref Range  ? Glucose-Capillary 87 70 - 99 mg/dL  ?  Comment: Glucose reference range applies only to samples taken after fasting for at least 8 hours.  ?Glucose, capillary     Status: None  ? Collection Time: 01/20/22  7:47 PM  ?Result Value Ref Range  ? Glucose-Capillary 82 70 - 99 mg/dL  ?  Comment: Glucose reference range applies only to samples taken after fasting for at least 8 hours.  ?Glucose, capillary     Status: None  ? Collection Time: 01/20/22 11:49 PM  ?Result Value Ref Range  ? Glucose-Capillary 72 70 - 99 mg/dL  ?  Comment: Glucose reference range applies only to samples taken after fasting for at least 8 hours.  ?Comprehensive metabolic panel     Status:  Abnormal  ? Collection Time: 01/21/22  1:58 AM  ?Result Value Ref Range  ? Sodium 133 (L) 135 - 145 mmol/L  ? Potassium 4.1 3.5 - 5.1 mmol/L  ? Chloride 105 98 - 111 mmol/L  ? CO2 21 (L) 22 - 32 mmol/L  ? Glucose, Bld 71 70 - 99 mg/dL  ?  Comment: Glucose reference range applies only to samples taken after fasting for at least 8 hours.  ? BUN 15 8 - 23 mg/dL  ? Creatinine, Ser 1.08 (H) 0.44 - 1.00 mg/dL  ? Calcium 7.3 (L) 8.9 - 10.3 mg/dL  ? Total Protein 3.9 (L) 6.5 - 8.1 g/dL  ? Albumin <1.5 (L) 3.5 - 5.0 g/dL  ? AST 153 (H) 15 - 41 U/L  ? ALT 94 (H) 0 - 44 U/L  ? Alkaline Phosphatase 218 (H) 38 - 126 U/L  ? Total Bilirubin 1.0 0.3 - 1.2 mg/dL  ? GFR, Estimated 49 (L) >60 mL/min  ?  Comment: (NOTE) ?Calculated using the CKD-EPI Creatinine Equation (2021) ?  ? Anion gap 7 5 - 15  ?  Comment: Performed at [facility], [address] [place], [place] [address]  ?Brain natriuretic peptide     Status: Abnormal  ? Collection Time: 01/21/22  1:58 AM  ?Result Value Ref Range  ? B Natriuretic Peptide 293.3 (H) 0.0 - 100.0 pg/mL  ?  Comment: Performed at [facility], [address], [place], [place] [address]  ?Phosphorus     Status: Abnormal  ? Collection Time: 01/21/22  1:58 AM  ?Result Value Ref Range  ? Phosphorus 2.3 (L) 2.5 - 4.6 mg/dL  ?  Comment: Performed at [facility], [address] [place], [place] [address]  ?Magnesium     Status: None  ? Collection Time: 01/21/22  1:58 AM  ?Result Value Ref Range  ? Magnesium 1.8 1.7 - 2.4 mg/dL  ?  Comment: Performed at [facility], [address], [place], [place] [address]  ?CBC     Status: Abnormal  ? Collection Time: 01/21/22  1:58 AM  ?Result Value Ref Range  ? WBC 4.9 4.0 - 10.5 K/uL  ? RBC 3.70 (L) 3.87 - 5.11 MIL/uL  ? Hemoglobin 11.0 (L) 12.0 - 15.0 g/dL  ? HCT 32.8 (L) 36.0 - 46.0 %  ? MCV 88.6 80.0 - 100.0 fL  ? MCH 29.7 26.0 - 34.0 pg  ? MCHC 33.5 30.0 - 36.0 g/dL  ? RDW 14.2 11.5 - 15.5 %  ? Platelets 97 (L) 150 - 400 K/uL  ?   Comment: SPECIMEN CHECKED FOR CLOTS ?Immature Platelet Fraction may be ?clinically indicated, consider ?ordering this additional test ?[id number] ?CONSISTENT WITH PREVIOUS RESULT ?  ? nRBC 0.4 (H) 0.0 - 0.2 %  ?  Comment: Performed at [facility], [address] [place], [place] [address]  ?Prealbumin     Status: Abnormal  ? Collection Time: 01/21/22  1:58 AM  ?Result Value Ref Range  ? Prealbumin <5 (L) 18 - 38 mg/dL  ?  Comment: Performed at [facility], [address] [place], [place] [address]  ?Glucose, capillary     Status: Abnormal  ? Collection Time: 01/21/22  3:59 AM  ?Result Value Ref Range  ? Glucose-Capillary 69 (L) 70 - 99 mg/dL  ?  Comment: Glucose reference range applies only to samples taken after fasting for at least 8 hours.  ?Glucose, capillary     Status: None  ? Collection Time: 01/21/22  7:48 AM  ?Result Value Ref Range  ? Glucose-Capillary 73 70 - 99 mg/dL  ?  Comment: Glucose reference range applies only to samples taken after fasting for at least 8 hours.  ?Glucose, capillary     Status: None  ? Collection Time: 01/21/22 11:38 AM  ?Result Value Ref Range  ? Glucose-Capillary 81 70 - 99 mg/dL  ?  Comment: Glucose reference range applies only to samples taken after fasting for at least 8 hours.  ?Glucose, capillary     Status: Abnormal  ? Collection Time: 01/21/22  4:32 PM  ?Result Value Ref Range  ? Glucose-Capillary 63 (L) 70 - 99 mg/dL  ?  Comment: Glucose reference range applies only to samples taken after fasting for at least 8 hours.  ?Glucose, capillary     Status: None  ? Collection Time: 01/21/22  4:58 PM  ?Result Value Ref Range  ? Glucose-Capillary 71 70 - 99 mg/dL  ?  Comment: Glucose reference range applies only to samples taken after fasting for at least 8 hours.  ?Glucose, capillary     Status: Abnormal  ? Collection Time: 01/21/22  8:16 PM  ?Result Value Ref Range  ? Glucose-Capillary 100 (H) 70 - 99 mg/dL  ?  Comment: Glucose reference range applies only to  samples taken after fasting for at least 8  hours.  ?Glucose, capillary     Status: None  ? Collection Time: 01/22/22 12:05 AM  ?Result Value Ref Range  ? Glucose-Capillary 76 70 - 99 mg/dL  ?  Comment: Glucose reference range applies only to samples taken after fasting for at least 8 hours.  ?Comprehensive metabolic panel     Status: Abnormal  ? Collection Time: 01/22/22  1:43 AM  ?Result Value Ref Range  ? Sodium 133 (L) 135 - 145 mmol/L  ? Potassium 4.1 3.5 - 5.1 mmol/L  ? Chloride 106 98 - 111 mmol/L  ? CO2 24 22 - 32 mmol/L  ? Glucose, Bld 80 70 - 99 mg/dL  ?  Comment: Glucose reference range applies only to samples taken after fasting for at least 8 hours.  ? BUN 17 8 - 23 mg/dL  ? Creatinine, Ser 1.10 (H) 0.44 - 1.00 mg/dL  ? Calcium 6.9 (L) 8.9 - 10.3 mg/dL  ? Total Protein 3.7 (L) 6.5 - 8.1 g/dL  ? Albumin <1.5 (L) 3.5 - 5.0 g/dL  ? AST 67 (H) 15 - 41 U/L  ? ALT 62 (H) 0 - 44 U/L  ? Alkaline Phosphatase 202 (H) 38 - 126 U/L  ? Total Bilirubin 0.5 0.3 - 1.2 mg/dL  ? GFR, Estimated 48 (L) >60 mL/min  ?  Comment: (NOTE) ?Calculated using the CKD-EPI Creatinine Equation (2021) ?  ? Anion gap 3 (L) 5 - 15  ?  Comment: Performed at [facility], [address], [place], [place] [address]  ?TSH     Status: Abnormal  ? Collection Time: 01/22/22  1:43 AM  ?Result Value Ref Range  ? TSH 4.897 (H) 0.350 - 4.500 uIU/mL  ?  Comment: Performed by a 3rd Generation assay with a functional sensitivity of <=0.01 uIU/mL. ?Performed at [facility], [address], [place], [place] [address] ?  ?CBC     Status: Abnormal  ? Collection Time: 01/22/22  1:43 AM  ?Result Value Ref Range  ? WBC 7.8 4.0 - 10.5 K/uL  ? RBC 3.35 (L) 3.87 - 5.11 MIL/uL  ? Hemoglobin 9.9 (L) 12.0 - 15.0 g/dL  ? HCT 30.0 (L) 36.0 - 46.0 %  ? MCV 89.6 80.0 - 100.0 fL  ? MCH 29.6 26.0 - 34.0 pg  ? MCHC 33.0 30.0 - 36.0 g/dL  ? RDW 14.4 11.5 - 15.5 %  ? Platelets 163 150 - 400 K/uL  ? nRBC 0.4 (H) 0.0 - 0.2 %  ?  Comment: Performed at [facility], [address] [place], [place] [address]  ?Glucose, capillary     Status: None  ? Collection Time: 01/22/22  4:26 AM  ?Result Value Ref Range  ? Glucose-Capillary 73 70 - 99 mg/dL  ?  Comment: Glucose refer

## 2022-01-22 NOTE — Progress Notes (Signed)
Pt ostomy pouch leaked all over midline incision and JP dressing while working with OT. Dressing removed and and incision cleaned with NACL and betadine and ostomy pouch placed as best as possible. New dressing placed wet to dry over midline incision will notify MD ? ?Everlean Cherry, RN ? ?

## 2022-01-22 NOTE — Progress Notes (Signed)
? ?PROGRESS NOTE ? ? ? ?Alexis Cortez  [id number] DOB: Jul 06, 1931 DOA: 01/15/2022 ?PCP: Jettie Booze, NP ? ? ?Brief Narrative: ?88 year old female with history of HTN, anxiety, cognitive impairment and ambulatory dysfunction with rolling walker dependence presenting with abdominal pain, constipation, poor p.o. intake and admitted by general surgery on 01/15/2022 with working diagnosis of pneumoperitoneum as noted on CT abdomen and pelvis.  She was started on IV Zosyn.  She underwent sigmoid colectomy with end colostomy on 4/1 for perforated sigmoid colon with peritonitis. Hospitalist consulted for medical care. ? ? ? ? ?Assessment and Plan: ?* Pneumoperitoneum/perforated sigmoid colon ?S/p sigmoid colectomy and end colostomy by Donne Hazel.  ?-Surgical wound care, JP drain and colostomy care per general surgery. ?-Remains on IV Zosyn per general surgery. ?-Encourage incentive spirometry/mobility ?-tylenol scheduled ? ?Goals of care, counseling/discussion ?Per Dr. Cyndia Skeeters: Extensive discussion about CODE STATUS and pros and cons of CPR and intubation with patient's son at bedside.  Patient has some cognitive impairment but oriented to self, person and place.  Although she does not have a good recollection of what happened, she seemed to comprehend the conversation.  After risk and benefit discussion, she does not want CPR or further intubation.  Patient's son is in agreement. ? ? ?Acute respiratory failure ([place]) ?Patient required mechanical ventilation and ICU stay postoperatively.  Extubated on 4/3.  Currently on room air. ?-Encourage incentive spirometry ? ?AKI (acute kidney injury) ([place]) ?-Cr.  0.96 in 05/2020 ?-Avoid nephrotoxic meds. ? ? ?Bacteremia ?Blood culture on 3/31 with bacteroids thetaiotaomicron.  Likely GI translocation. ?-Continue IV Zosyn ? ?Abnormal echocardiogram ?Limited TTE on 4/1 with probably mild to moderately decreased LVEF and dilated RV with what appears to be severely reduced RVSF but  very limited window.  Overall, she appears euvolemic except for RLE swelling from infiltrated line.  Currently on room air.  No respiratory distress. ?-Monitor intake and output ?-Repeat Transthoracic Echocardiogram (complete) ? ?Hypocalcemia ?Corrects to normal for hypoalbuminemia. ? ?Thrombocytopenia ([place]) ?Likely secondary to acute illness. Now resolved. ? ?Hypoalbuminemia ?-Encourage PO intake ?-Dietitian consult ? ?Normocytic anemia ?Likely due to acute illness and some surgical blood loss. Stable. No evidence of bleeding. Associated thrombocytopenia has resolved. ? ? ?Hyponatremia ?Mild and stable. Asymptomatic. ? ?Cognitive impairment ?Oriented to self, person and place but not time.  Dependent for most ADL's. ?-Reorientation and delirium precautions. ? ?Essential hypertension ?Initially normotensive off home amlodipine and lisinopril. Now with increasing blood pressure ?-Continue holding both ?-Hydralazine PO for now; restart home medication if blood pressure continues to be elevated and/or patient is requiring hydralazine PRN for blood pressure control ? ?Septic shock ([place]) ?Likely due to perforated bowel and bacteremia.  Resolved. ? ? ? ?DVT prophylaxis: Per primary: Heparin subq ?Code Status:   Code Status: DNR ?Family Communication: None at bedside ?Disposition Plan: Per primary. Would need repeat Transthoracic Echocardiogram and +/- cardiology follow-up prior to discharge ? ? ?Procedures:  ?Sigmoid colectomy ?Echo ? ?Antimicrobials: ?Zosyn IV  ? ? ?Subjective: ?Patient reports no issues overnight. No abdominal pain. ? ?Objective: ?BP (!) 127/56 (BP Location: Left Arm)   Pulse 62   Temp 97.9 ?F (36.6 ?C) (Oral)   Resp 12   Ht 5\' 2"  (1.575 m)   Wt 68 kg   SpO2 100%   BMI 27.44 kg/m?  ? ?Examination: ? ?General exam: Appears calm and comfortable ?Respiratory system: Clear to auscultation. Respiratory effort normal. ?Cardiovascular system: S1 & S2 heard, RRR. No murmurs, rubs, gallops or  clicks. ?Gastrointestinal system:  Abdomen is mildly distended, soft and nontender. Decreased bowel sounds heard. Colostomy bag with minimal brown stool noted ?Central nervous system: Alert and oriented to person, place and year. ?Musculoskeletal: No edema. No calf tenderness ?Skin: No cyanosis. No rashes ? ? ?Data Reviewed: I have personally reviewed following labs and imaging studies ? ?CBC ?Lab Results  ?Component Value Date  ? WBC 7.8 01/22/2022  ? RBC 3.35 (L) 01/22/2022  ? HGB 9.9 (L) 01/22/2022  ? HCT 30.0 (L) 01/22/2022  ? MCV 89.6 01/22/2022  ? MCH 29.6 01/22/2022  ? PLT 163 01/22/2022  ? MCHC 33.0 01/22/2022  ? RDW 14.4 01/22/2022  ? LYMPHSABS 1.3 01/15/2022  ? MONOABS 0.3 01/15/2022  ? EOSABS 0.1 01/15/2022  ? BASOSABS 0.0 01/15/2022  ? ? ? ?Last metabolic panel ?Lab Results  ?Component Value Date  ? NA 133 (L) 01/22/2022  ? K 4.1 01/22/2022  ? CL 106 01/22/2022  ? CO2 24 01/22/2022  ? BUN 17 01/22/2022  ? CREATININE 1.10 (H) 01/22/2022  ? GLUCOSE 80 01/22/2022  ? GFRNONAA 48 (L) 01/22/2022  ? GFRAA >60 10/10/2019  ? CALCIUM 6.9 (L) 01/22/2022  ? PHOS 2.3 (L) 01/21/2022  ? PROT 3.7 (L) 01/22/2022  ? ALBUMIN <1.5 (L) 01/22/2022  ? BILITOT 0.5 01/22/2022  ? ALKPHOS 202 (H) 01/22/2022  ? AST 67 (H) 01/22/2022  ? ALT 62 (H) 01/22/2022  ? ANIONGAP 3 (L) 01/22/2022  ? ? ?GFR: ?Estimated Creatinine Clearance: 32 mL/min (A) (by C-G formula based on SCr of 1.1 mg/dL (H)). ? ?Recent Results (from the past 240 hour(s))  ?Culture, blood (routine x 2)     Status: Abnormal  ? Collection Time: 01/15/22  7:38 PM  ? Specimen: BLOOD  ?Result Value Ref Range Status  ? Specimen Description BLOOD RIGHT ANTECUBITAL  Final  ? Special Requests   Final  ?  BOTTLES DRAWN AEROBIC AND ANAEROBIC Blood Culture adequate volume  ? Culture  Setup Time (A)  Final  ?  GRAM VARIABLE ROD ?ANAEROBIC BOTTLE ONLY ?CRITICAL RESULT CALLED TO, READ BACK BY AND VERIFIED WITH: PHARM D C.PIERCE ON [id number] AT 1420 BY E.PARRISH ?  ? Culture (A)  Final   ?  BACTEROIDES THETAIOTAOMICRON ?BETA LACTAMASE POSITIVE ?EGGERTHELLA LENTA ?Standardized susceptibility testing for this organism is not available. ?Performed at [facility], [address] [place], [place] [address] ?  ? Report Status 01/20/2022 FINAL  Final  ?Blood Culture ID Panel (Reflexed)     Status: None  ? Collection Time: 01/15/22  7:38 PM  ?Result Value Ref Range Status  ? Enterococcus faecalis NOT DETECTED NOT DETECTED Final  ? Enterococcus Faecium NOT DETECTED NOT DETECTED Final  ? Listeria monocytogenes NOT DETECTED NOT DETECTED Final  ? Staphylococcus species NOT DETECTED NOT DETECTED Final  ? Staphylococcus aureus (BCID) NOT DETECTED NOT DETECTED Final  ? Staphylococcus epidermidis NOT DETECTED NOT DETECTED Final  ? Staphylococcus lugdunensis NOT DETECTED NOT DETECTED Final  ? Streptococcus species NOT DETECTED NOT DETECTED Final  ? Streptococcus agalactiae NOT DETECTED NOT DETECTED Final  ? Streptococcus pneumoniae NOT DETECTED NOT DETECTED Final  ? Streptococcus pyogenes NOT DETECTED NOT DETECTED Final  ? A.calcoaceticus-baumannii NOT DETECTED NOT DETECTED Final  ? Bacteroides fragilis NOT DETECTED NOT DETECTED Final  ? Enterobacterales NOT DETECTED NOT DETECTED Final  ? Enterobacter cloacae complex NOT DETECTED NOT DETECTED Final  ? Escherichia coli NOT DETECTED NOT DETECTED Final  ? Klebsiella aerogenes NOT DETECTED NOT DETECTED Final  ? Klebsiella oxytoca NOT DETECTED  NOT DETECTED Final  ? Klebsiella pneumoniae NOT DETECTED NOT DETECTED Final  ? Proteus species NOT DETECTED NOT DETECTED Final  ? Salmonella species NOT DETECTED NOT DETECTED Final  ? Serratia marcescens NOT DETECTED NOT DETECTED Final  ? Haemophilus influenzae NOT DETECTED NOT DETECTED Final  ? Neisseria meningitidis NOT DETECTED NOT DETECTED Final  ? Pseudomonas aeruginosa NOT DETECTED NOT DETECTED Final  ? Stenotrophomonas maltophilia NOT DETECTED NOT DETECTED Final  ? Candida albicans NOT DETECTED NOT DETECTED Final  ?  Candida auris NOT DETECTED NOT DETECTED Final  ? Candida glabrata NOT DETECTED NOT DETECTED Final  ? Candida krusei NOT DETECTED NOT DETECTED Final  ? Candida parapsilosis NOT DETECTED NOT DETECTED Final  ? Candida tropic

## 2022-01-22 NOTE — Progress Notes (Signed)
?  Echocardiogram ?2D Echocardiogram has been performed. ? ?Alexis Cortez ?01/22/2022, 5:49 PM ?

## 2022-01-22 NOTE — Progress Notes (Addendum)
Physical Therapy Treatment Patient Details Name: Alexis Cortez MRN: [id number] DOB: January 29, 1931 Today's Date: 01/22/2022   History of Present Illness Pt adm 3/31 with abdominal pain. Pt found to have stercoral ulcer with perforation and underwent colectomy and colostomy on 4/1. Pt remained on vent until extubated on 4/3.  PMH - HTN, macular degeneration, dementia.    PT Comments    Pt received in supine, lethargic and slow to awaken but agreeable to therapy session with max encouragement. Pt oriented to situation/location/self and will open eyes briefly to cues but tending to close her eyes when not cued. Pt following 1-step multimodal cues with increased time and needing up to +2 maxA for bed mobility and stand>sit due to fatigue/deconditioning. Of note, pt with increased odor from ostomy site during mobility tasks and once pt returned to supine via log roll, therapist observed stool on her gown, with closer inspection a gap was observed on midline side of ostomy pouch, RN called to room to assess with fecal contents having spread across pt's abdominal dressing, RN entering room to change dressing at end of session. Pt continues to benefit from PT services to progress toward functional mobility goals.    Recommendations for follow up therapy are one component of a multi-disciplinary discharge planning process, led by the attending physician.  Recommendations may be updated based on patient status, additional functional criteria and insurance authorization.  Follow Up Recommendations  Skilled nursing-short term rehab (<3 hours/day)     Assistance Recommended at Discharge Frequent or constant Supervision/Assistance  Patient can return home with the following Two people to help with walking and/or transfers;Assist for transportation   Equipment Recommendations  None recommended by PT    Recommendations for Other Services       Precautions / Restrictions Precautions Precautions: Fall;Other  (comment) Precaution Comments: colostomy, JP drain, abdominal discomfort Restrictions Weight Bearing Restrictions: No     Mobility  Bed Mobility Overal bed mobility: Needs Assistance Bed Mobility: Rolling, Sidelying to Sit, Sit to Sidelying Rolling: Max assist, +2 for safety/equipment Sidelying to sit: +2 for physical assistance, Mod assist, HOB elevated     Sit to sidelying: Max assist, +2 for physical assistance General bed mobility comments: tactile cues and physical assist to bring legs off of bed, elevate trunk into sitting and bring hips to EOB. Pt attempting to assist with BUE on rail/propping up with elbow from raised HOB. Assist to lower trunk and bring feet back up into bed. Pt reports she sleeps in a recliner at baseline.    Transfers Overall transfer level: Needs assistance Equipment used: 2 person hand held assist Transfers: Sit to/from Stand Sit to Stand: +2 physical assistance, Mod assist           General transfer comment: Assist to bring hips up and for balance; pt quick to fatigue and L/posterior lean    Ambulation/Gait     Assistive device: 2 person hand held assist       Pre-gait activities: attempted sidesteps but pt with poor purchase on ground and L/posterior lean needing +2 maxA to totalA for sidestep. Defer gait progression due to pt lethargy and unable to safely perform with +2 assist      Balance Overall balance assessment: Needs assistance Sitting-balance support: No upper extremity supported, Feet supported Sitting balance-Leahy Scale: Poor Sitting balance - Comments: needing external support today for seated balance due to lethargy   Standing balance support: Bilateral upper extremity supported Standing balance-Leahy Scale: Zero Standing balance comment: +2  external support (HHA) without AD maxA static standing; max to totalA for dynamic standing task             Cognition Arousal/Alertness: Lethargic, Suspect due to  medications Behavior During Therapy: Flat affect Overall Cognitive Status: No family/caregiver present to determine baseline cognitive functioning Area of Impairment: Orientation, Attention, Memory, Following commands, Safety/judgement, Problem solving, Awareness        Orientation Level: Disoriented to, Time Current Attention Level: Focused Memory: Decreased short-term memory Following Commands: Follows one step commands with increased time, Follows multi-step commands inconsistently   Awareness: Intellectual Problem Solving: Slow processing, Requires verbal cues, Difficulty sequencing, Requires tactile cues General Comments: Pt remains drowsy throughout (more than previous session) note she had mirtazapine at 10pm previous evening. Pt opens eyes briefly to max cues but closes them momentarily unless cued to re-open them, even while seated EOB. Pt perseverating on "laying down", denies dizziness/lightheadedness but c/o fatigue. BP stable supine>sit so likely lethargy due to medication.        Exercises Other Exercises Other Exercises: supine BLE AROM: ankle pumps, hip abduction (tactile cues due to lethargy) x10 reps ea Other Exercises: supine BLE PROM: heel cord stretch with 30 sec hold ea    General Comments General comments (skin integrity, edema, etc.): Of note, pt with increased odor from colostomy while standing/returning to bed and RN notified some drainage from side of midline dressing near JP drain that appears similar to fecal matter, also noted that colostomy dressing appears to have opening on midline side, RN called to room to assess and change dressing for pt safety. Also some stool appearing discharge on newer purewick in peri area, RN notified.      Pertinent Vitals/Pain Pain Assessment Pain Assessment: Faces Faces Pain Scale: Hurts even more Pain Location: abdomen with mobility, pt states "8/10" but appears closer to 6/10 with sx, pt not calling out or grimacing  often, pt notably drowsy throughout. Pain Descriptors / Indicators: Discomfort, Operative site guarding Pain Intervention(s): Limited activity within patient's tolerance, Monitored during session, Premedicated before session, Repositioned     PT Goals (current goals can now be found in the care plan section) Acute Rehab PT Goals Patient Stated Goal: less pain PT Goal Formulation: With patient Time For Goal Achievement: 02/02/22 Progress towards PT goals: Progressing toward goals    Frequency    Min 2X/week      PT Plan Current plan remains appropriate    Co-evaluation PT/OT/SLP Co-Evaluation/Treatment: Yes Reason for Co-Treatment: Necessary to address cognition/behavior during functional activity;To address functional/ADL transfers;For patient/therapist safety PT goals addressed during session: Mobility/safety with mobility;Balance;Strengthening/ROM        AM-PAC PT "6 Clicks" Mobility   Outcome Measure  Help needed turning from your back to your side while in a flat bed without using bedrails?: A Lot Help needed moving from lying on your back to sitting on the side of a flat bed without using bedrails?: Total Help needed moving to and from a bed to a chair (including a wheelchair)?: Total Help needed standing up from a chair using your arms (e.g., wheelchair or bedside chair)?: A Lot Help needed to walk in hospital room?: Total Help needed climbing 3-5 steps with a railing? : Total 6 Click Score: 8    End of Session   Activity Tolerance: Patient limited by lethargy;Patient limited by fatigue;Other (comment) (noted dressing appears not to be fully intact and stool leaking so defer further mobility after initial stand) Patient left: in bed;with call  bell/phone within reach;with bed alarm set;with nursing/sitter in room (RN in room assessing surgical dressing/ostomy pouch) Nurse Communication: Mobility status;Need for lift equipment;Other (comment) (dressing needs to be  assessed/changed) PT Visit Diagnosis: Other abnormalities of gait and mobility (R26.89);Muscle weakness (generalized) (M62.81)     Time: 1914-7829 PT Time Calculation (min) (ACUTE ONLY): 28 min  Charges:  $Therapeutic Activity: 8-22 mins                     Alexis Vanderveer P., PTA Acute Rehabilitation Services Secure Chat Preferred 9a-5:30pm Office: [phone]    Alexis Cortez [facility] 01/22/2022, 12:07 PM

## 2022-01-22 NOTE — Progress Notes (Signed)
Occupational Therapy Treatment ?Patient Details ?Name: Alexis Cortez ?MRN: [id number] ?DOB: 08-22-1931 ?Today's Date: 01/22/2022 ? ? ?History of present illness Pt ad 3/31 with abdominal pain. Pt found to have stercoral ulcer with perforation and underwent colectomy and colostomy on 4/1. Pt remained on vent until extubated on 4/3.  PMH - HTN, macular degeneration, dementia. ?  ?OT comments ? Pt progressed to EOB sitting with dangle and total+2 max (A) to stand. Pt high fall risk and recommend lift for oOB when more aroused. Pt with leaking ostomy noted below. Recommendation SNF at this time.   ? ?Recommendations for follow up therapy are one component of a multi-disciplinary discharge planning process, led by the attending physician.  Recommendations may be updated based on patient status, additional functional criteria and insurance authorization. ?   ?Follow Up Recommendations ? Skilled nursing-short term rehab (<3 hours/day)  ?  ?Assistance Recommended at Discharge Frequent or constant Supervision/Assistance  ?Patient can return home with the following ? A lot of help with walking and/or transfers;A lot of help with bathing/dressing/bathroom ?  ?Equipment Recommendations ? Other (comment)  ?  ?Recommendations for Other Services Rehab consult ? ?  ?Precautions / Restrictions Precautions ?Precautions: Fall;Other (comment) ?Precaution Comments: colostomy, JP drain, abdominal discomfort ?Restrictions ?Weight Bearing Restrictions: No  ? ? ?  ? ?Mobility Bed Mobility ?Overal bed mobility: Needs Assistance ?Bed Mobility: Rolling, Sidelying to Sit, Sit to Sidelying ?Rolling: Max assist, +2 for safety/equipment ?Sidelying to sit: +2 for physical assistance, Mod assist, HOB elevated ?  ?  ?Sit to sidelying: Max assist, +2 for physical assistance ?General bed mobility comments: tactile cues and physical assist to bring legs off of bed, elevate trunk into sitting and bring hips to EOB. Pt attempting to assist with BUE on  rail/propping up with elbow from raised HOB. Assist to lower trunk and bring feet back up into bed. Pt reports she sleeps in a recliner at baseline. ?  ? ?Transfers ?Overall transfer level: Needs assistance ?Equipment used: 2 person hand held assist ?Transfers: Sit to/from Stand ?Sit to Stand: +2 physical assistance, Mod assist ?  ?  ?  ?  ?  ?General transfer comment: Assist to bring hips up and for balance; pt quick to fatigue and L/posterior lean ?  ?  ?Balance Overall balance assessment: Needs assistance ?Sitting-balance support: No upper extremity supported, Feet supported ?Sitting balance-Leahy Scale: Poor ?Sitting balance - Comments: needing external support today for seated balance due to lethargy ?  ?Standing balance support: Bilateral upper extremity supported ?Standing balance-Leahy Scale: Zero ?Standing balance comment: +2 external support (HHA) without AD maxA static standing; max to totalA for dynamic standing task ?  ?  ?  ?  ?  ?  ?  ?  ?  ?  ?  ?   ? ?ADL either performed or assessed with clinical judgement  ? ?ADL Overall ADL's : Needs assistance/impaired ?  ?  ?  ?  ?Upper Body Bathing: Maximal assistance ?  ?Lower Body Bathing: Maximal assistance ?  ?Upper Body Dressing : Maximal assistance ?Upper Body Dressing Details (indicate cue type and reason): need to doff gown due to bowel on gown ?Lower Body Dressing: Total assistance ?  ?Toilet Transfer: +2 for physical assistance;Maximal assistance ?  ?  ?  ?  ?  ?  ?General ADL Comments: pt very fatigued limiting progression. pt noted to have ostomy leakage ?  ? ?Extremity/Trunk Assessment Upper Extremity Assessment ?Upper Extremity Assessment: Generalized weakness;RUE deficits/detail ?RUE Deficits /  Details: Edema noted from elbow down to the wrist. pt with pitting noted with recommendation of elevation ?RUE Coordination: decreased fine motor;decreased gross motor ?  ?Lower Extremity Assessment ?Lower Extremity Assessment: Defer to PT evaluation ?  ?   ?  ? ?Vision   ?  ?  ?Perception   ?  ?Praxis   ?  ? ?Cognition Arousal/Alertness: Lethargic, Suspect due to medications ?Behavior During Therapy: Flat affect ?Overall Cognitive Status: No family/caregiver present to determine baseline cognitive functioning ?Area of Impairment: Orientation, Attention, Memory, Following commands, Safety/judgement, Problem solving, Awareness ?  ?  ?  ?  ?  ?  ?  ?  ?Orientation Level: Disoriented to, Time ?Current Attention Level: Focused ?Memory: Decreased short-term memory ?Following Commands: Follows one step commands with increased time, Follows multi-step commands inconsistently ?  ?Awareness: Intellectual ?Problem Solving: Slow processing, Requires verbal cues, Difficulty sequencing, Requires tactile cues ?General Comments: Pt remains drowsy throughout (more than previous session) note she had mirtazapine at 10pm previous evening. Pt opens eyes briefly to max cues but closes them momentarily unless cued to re-open them, even while seated EOB. Pt perseverating on "laying down", denies dizziness/lightheadedness but c/o fatigue. BP stable supine>sit so likely lethargy due to medication. ?  ?  ?   ?Exercises Exercises: Other exercises ?Other Exercises ?Other Exercises: supine BLE AROM: ankle pumps, hip abduction (tactile cues due to lethargy) x10 reps ea ?Other Exercises: supine BLE PROM: heel cord stretch with 30 sec hold ea ? ?  ?Shoulder Instructions   ? ? ?  ?General Comments Of note, pt with increased odor from colostomy while standing/returning to bed and RN notified some drainage from side of midline dressing near JP drain that appears similar to fecal matter, also noted that colostomy dressing appears to have opening on midline side, RN called to room to assess and change dressing for pt safety. Also some stool appearing discharge on newer purewick in peri area, RN notified  ? ? ?Pertinent Vitals/ Pain       Pain Assessment ?Pain Assessment: Faces ?Faces Pain Scale: Hurts  even more ?Pain Location: abdomen with mobility, pt states "8/10" but appears closer to 6/10 with sx, pt not calling out or grimacing often, pt notably drowsy throughout. ?Pain Descriptors / Indicators: Discomfort, Operative site guarding ?Pain Intervention(s): Monitored during session, Repositioned ? ?Home Living   ?  ?  ?  ?  ?  ?  ?  ?  ?  ?  ?  ?  ?  ?  ?  ?  ?  ?  ? ?  ?Prior Functioning/Environment    ?  ?  ?  ?   ? ?Frequency ? Min 2X/week  ? ? ? ? ?  ?Progress Toward Goals ? ?OT Goals(current goals can now be found in the care plan section) ? Progress towards OT goals: Progressing toward goals ? ?Acute Rehab OT Goals ?Patient Stated Goal: wanting to sleep ?OT Goal Formulation: Patient unable to participate in goal setting ?Time For Goal Achievement: 02/02/22 ?Potential to Achieve Goals: Good ?ADL Goals ?Pt Will Perform Grooming: with supervision;standing ?Pt Will Transfer to Toilet: with min guard assist;ambulating ?Pt/caregiver will Perform Home Exercise Program: Increased strength;Both right and left upper extremity;With theraband;With Supervision;With written HEP provided ?Additional ADL Goal #1: Pt to complete bed mobility with supervision in prep for ADLs  ?Plan Discharge plan remains appropriate   ? ?Co-evaluation ? ? ? PT/OT/SLP Co-Evaluation/Treatment: Yes ?Reason for Co-Treatment: Necessary to address cognition/behavior during functional  activity;For patient/therapist safety;To address functional/ADL transfers ?PT goals addressed during session: Mobility/safety with mobility;Balance;Strengthening/ROM ?OT goals addressed during session: ADL's and self-care;Proper use of Adaptive equipment and DME ?  ? ?  ?AM-PAC OT "6 Clicks" Daily Activity     ?Outcome Measure ? ? Help from another person eating meals?: A Lot ?Help from another person taking care of personal grooming?: A Lot ?Help from another person toileting, which includes using toliet, bedpan, or urinal?: Total ?Help from another person bathing  (including washing, rinsing, drying)?: A Lot ?Help from another person to put on and taking off regular upper body clothing?: A Lot ?Help from another person to put on and taking off regular lower body clo

## 2022-01-23 DIAGNOSIS — K668 Other specified disorders of peritoneum: Secondary | ICD-10-CM | POA: Diagnosis not present

## 2022-01-23 LAB — GLUCOSE, CAPILLARY
Glucose-Capillary: 80 mg/dL (ref 70–99)
Glucose-Capillary: 78 mg/dL (ref 70–99)
Glucose-Capillary: 96 mg/dL (ref 70–99)
Glucose-Capillary: 108 mg/dL — ABNORMAL HIGH (ref 70–99)
Glucose-Capillary: 113 mg/dL — ABNORMAL HIGH (ref 70–99)
Glucose-Capillary: 120 mg/dL — ABNORMAL HIGH (ref 70–99)

## 2022-01-23 MED ORDER — ADULT MULTIVITAMIN W/MINERALS CH
1.0000 | ORAL_TABLET | Freq: Every day | ORAL | Status: DC
  Administered 2022-01-23 – 2022-01-28 (×6): 1 via ORAL
  Filled 2022-01-23 (×6): qty 1

## 2022-01-23 MED ORDER — ENSURE ENLIVE PO LIQD
237.0000 mL | Freq: Three times a day (TID) | ORAL | Status: DC
  Administered 2022-01-23 – 2022-01-28 (×13): 237 mL via ORAL

## 2022-01-23 MED ORDER — PROSOURCE PLUS PO LIQD
30.0000 mL | Freq: Three times a day (TID) | ORAL | Status: DC
  Administered 2022-01-23 – 2022-01-28 (×14): 30 mL via ORAL
  Filled 2022-01-23 (×15): qty 30

## 2022-01-23 NOTE — Progress Notes (Signed)
Initial Nutrition Assessment ? ?DOCUMENTATION CODES:  ? ?Not applicable ? ?INTERVENTION:  ? ?-Increase Ensure Enlive po to TID, each supplement provides 350 kcal and 20 grams of protein ?-MVI with mineral daily ?-30 ml Prosource Plus TID, each supplement provides 100 kcals and 15 grams protein ? ?NUTRITION DIAGNOSIS:  ? ?Inadequate oral intake related to poor appetite as evidenced by meal completion < 50%. ? ?GOAL:  ? ?Patient will meet greater than or equal to 90% of their needs ? ?MONITOR:  ? ?PO intake, Supplement acceptance, Labs, Weight trends, Skin, I & O's ? ?REASON FOR ASSESSMENT:  ? ?Consult ?Assessment of nutrition requirement/status ? ?ASSESSMENT:  ? ?89 yof who lives in assisted living. Per her son Alexis Cortez she needs help getting dressed.  She uses a walker. For past 3 weeks she has not been eating. She has struggled with constipation her whole life. She has been disimpacted  a couple times recently.  She also had some loose stool placed on an antidiarrheal at facility as well.  She had ab pain today.  This is present. Nothing making it better. No fever. No emesis. Last bm yesterday. She is really nonverbal when I see her.  Her son Alexis Cortez is her medical poa and he is present. I eventually also discussed this with her sister Alexis Cortez via telephone.  She underwent evaluation and was found to have normal vitals, normal wbc, cr 1.77.  she had ct scan that shows some pneumoperitoneum, diverticuli, air around sigmoid colon with large stool burden. ? ?Pt admitted with pneumoperitoneum.  ? ?4/1- s/p Procedure: ?1.  Sigmoid colectomy ?2.  End colostomy ? ?Reviewed I/O's: -795 ml x 24 hours and +8.3 L since admission ? ?UOP: 600 ml x 24 hours ? ?Drain output: 600 ml x 24 hours ? ?Colostomy output: 350 ml x 24 hours ? ?Pt unavailable at time of visit. Attempted to speak with pt via call to hospital room phone, however, unable to reach. RD unable to obtain further nutrition-related history or complete nutrition-focused  physical exam at this time.   ? ?Pt just advanced to soft diet today. Pt with poor appetite. Noted meal completions 0-25%. Pt inconsistent with Ensure supplements. Noted pt with some cognitive impairment.  ? ?Reviewed wt hx; noted distant history of weight loss. ? ?Per therapy notes, SNF is being recommended at discharge.  ? ?Medications reviewed and include remeron. ? ?Labs reviewed: Na: 133.  ? ?Diet Order:   ?Diet Order   ? ?       ?  DIET SOFT Room service appropriate? Yes; Fluid consistency: Thin  Diet effective now       ?  ? ?  ?  ? ?  ? ? ?EDUCATION NEEDS:  ? ?No education needs have been identified at this time ? ?Skin:  Skin Assessment: Skin Integrity Issues: ?Skin Integrity Issues:: Incisions ?Incisions: closed abdomen ? ?Last BM:  01/22/22 (350 ml via colostomy) ? ?Height:  ? ?Ht Readings from Last 1 Encounters:  ?01/15/22 5\' 2"  (1.575 m)  ? ? ?Weight:  ? ?Wt Readings from Last 1 Encounters:  ?01/15/22 68 kg  ? ? ?Ideal Body Weight:  50 kg ? ?BMI:  Body mass index is 27.44 kg/m?. ? ?Estimated Nutritional Needs:  ? ?Kcal:  1700-1900 ? ?Protein:  85-100 grams ? ?Fluid:  > 1.7 L ? ? ? ?01/17/22, RD, LDN, CDCES ?Registered Dietitian II ?Certified Diabetes Care and Education Specialist ?Please refer to [facility] for RD and/or RD on-call/weekend/after hours pager  ?

## 2022-01-23 NOTE — Progress Notes (Signed)
? ?PROGRESS NOTE ? ? ? ?Alexis Cortez  [id number] DOB: 05-01-1931 DOA: 01/15/2022 ?PCP: Jettie Booze, NP ? ? ?Brief Narrative: ?88 year old female with history of HTN, anxiety, cognitive impairment and ambulatory dysfunction with rolling walker dependence presenting with abdominal pain, constipation, poor p.o. intake and admitted by general surgery on 01/15/2022 with working diagnosis of pneumoperitoneum as noted on CT abdomen and pelvis.  She was started on IV Zosyn.  She underwent sigmoid colectomy with end colostomy on 4/1 for perforated sigmoid colon with peritonitis. Hospitalist consulted for medical care. ? ? ? ? ?Assessment and Plan: ?* Pneumoperitoneum/perforated sigmoid colon ?S/p sigmoid colectomy and end colostomy by Donne Hazel.  ?-Surgical wound care, JP drain and colostomy care per general surgery. ?-Remains on IV Zosyn per general surgery. ?-Encourage incentive spirometry/mobility ?-tylenol scheduled ? ?Goals of care, counseling/discussion ?Per Dr. Cyndia Skeeters: Extensive discussion about CODE STATUS and pros and cons of CPR and intubation with patient's son at bedside.  Patient has some cognitive impairment but oriented to self, person and place.  Although she does not have a good recollection of what happened, she seemed to comprehend the conversation.  After risk and benefit discussion, she does not want CPR or further intubation.  Patient's son is in agreement. ? ? ?Acute respiratory failure ([place]) ?Patient required mechanical ventilation and ICU stay postoperatively.  Extubated on 4/3.  Currently on room air. ?-Encourage incentive spirometry ? ?AKI (acute kidney injury) ([place]) ?-Cr.  0.96 in 05/2020 ?-Avoid nephrotoxic meds. ? ? ?Bacteremia ?Blood culture on 3/31 with bacteroids thetaiotaomicron.  Likely GI translocation. ?-Continue IV Zosyn ? ?Abnormal echocardiogram ?Limited TTE on 4/1 with probably mild to moderately decreased LVEF and dilated RV with what appears to be severely reduced RVSF but  very limited window.  Overall, she appears euvolemic except for RLE swelling from infiltrated line.  Currently on room air.  No respiratory distress. ?-Monitor intake and output ?-Repeated Transthoracic Echocardiogram w/o issues ? ?Hypocalcemia ?Corrects to normal for hypoalbuminemia. ? ?Thrombocytopenia ([place]) ?Likely secondary to acute illness. Now resolved. ? ?Hypoalbuminemia ?-Encourage PO intake, advance diet ?-Dietitian consult ? ?Normocytic anemia ?Likely due to acute illness and some surgical blood loss. Stable. No evidence of bleeding. Associated thrombocytopenia has resolved. ? ? ?Hyponatremia ?Mild and stable. Asymptomatic. ? ?Cognitive impairment ?Oriented to self, person and place but not time.  Dependent for most ADL's. ?-Reorientation and delirium precautions. ? ?Essential hypertension ?Initially normotensive off home amlodipine and lisinopril. Now with increasing blood pressure ?-Continue holding both ?-Hydralazine PO for now; restart home medication if blood pressure continues to be elevated and/or patient is requiring hydralazine PRN for blood pressure control ? ?Septic shock ([place]) ?Likely due to perforated bowel and bacteremia.  Resolved. ? ? ? ?DVT prophylaxis: Per primary: Heparin subq ?Code Status:   Code Status: DNR ?Family Communication: Son at bedside ?Disposition Plan: Per primary ? ? ?Procedures:  ?Sigmoid colectomy ?Echo ? ?Antimicrobials: ?Zosyn IV  ? ? ?Subjective: ?Not eating much ? ?Objective: ?BP (!) 151/61 (BP Location: Left Leg)   Pulse 68   Temp 99.2 ?F (37.3 ?C) (Axillary)   Resp 19   Ht 5\' 2"  (1.575 m)   Wt 68 kg   SpO2 94%   BMI 27.44 kg/m?  ? ?Examination: ? ? ?General: Appearance:     ?Overweight female in no acute distress  ?   ?Lungs:     respirations unlabored  ?Heart:    Normal heart rate.  ?  ?MS:   All extremities are intact.  ?  ?  Neurologic:   Awake, alert  ?  ? ? ?Data Reviewed: I have personally reviewed following labs and imaging studies ? ?CBC ?Lab Results   ?Component Value Date  ? WBC 7.8 01/22/2022  ? RBC 3.35 (L) 01/22/2022  ? HGB 9.9 (L) 01/22/2022  ? HCT 30.0 (L) 01/22/2022  ? MCV 89.6 01/22/2022  ? MCH 29.6 01/22/2022  ? PLT 163 01/22/2022  ? MCHC 33.0 01/22/2022  ? RDW 14.4 01/22/2022  ? LYMPHSABS 1.3 01/15/2022  ? MONOABS 0.3 01/15/2022  ? EOSABS 0.1 01/15/2022  ? BASOSABS 0.0 01/15/2022  ? ? ? ?Last metabolic panel ?Lab Results  ?Component Value Date  ? NA 133 (L) 01/22/2022  ? K 4.1 01/22/2022  ? CL 106 01/22/2022  ? CO2 24 01/22/2022  ? BUN 17 01/22/2022  ? CREATININE 1.10 (H) 01/22/2022  ? GLUCOSE 80 01/22/2022  ? GFRNONAA 48 (L) 01/22/2022  ? GFRAA >60 10/10/2019  ? CALCIUM 6.9 (L) 01/22/2022  ? PHOS 2.3 (L) 01/21/2022  ? PROT 3.7 (L) 01/22/2022  ? ALBUMIN <1.5 (L) 01/22/2022  ? BILITOT 0.5 01/22/2022  ? ALKPHOS 202 (H) 01/22/2022  ? AST 67 (H) 01/22/2022  ? ALT 62 (H) 01/22/2022  ? ANIONGAP 3 (L) 01/22/2022  ? ? ?GFR: ?Estimated Creatinine Clearance: 32 mL/min (A) (by C-G formula based on SCr of 1.1 mg/dL (H)). ? ?Recent Results (from the past 240 hour(s))  ?Culture, blood (routine x 2)     Status: Abnormal  ? Collection Time: 01/15/22  7:38 PM  ? Specimen: BLOOD  ?Result Value Ref Range Status  ? Specimen Description BLOOD RIGHT ANTECUBITAL  Final  ? Special Requests   Final  ?  BOTTLES DRAWN AEROBIC AND ANAEROBIC Blood Culture adequate volume  ? Culture  Setup Time (A)  Final  ?  GRAM VARIABLE ROD ?ANAEROBIC BOTTLE ONLY ?CRITICAL RESULT CALLED TO, READ BACK BY AND VERIFIED WITH: PHARM D C.PIERCE ON [id number] AT [id number] BY E.PARRISH ?  ? Culture (A)  Final  ?  BACTEROIDES THETAIOTAOMICRON ?BETA LACTAMASE POSITIVE ?EGGERTHELLA LENTA ?Standardized susceptibility testing for this organism is not available. ?Performed at [facility], [place] [address], [place], [place] [address] ?  ? Report Status 01/20/2022 FINAL  Final  ?Blood Culture ID Panel (Reflexed)     Status: None  ? Collection Time: 01/15/22  7:38 PM  ?Result Value Ref Range Status  ? Enterococcus  faecalis NOT DETECTED NOT DETECTED Final  ? Enterococcus Faecium NOT DETECTED NOT DETECTED Final  ? Listeria monocytogenes NOT DETECTED NOT DETECTED Final  ? Staphylococcus species NOT DETECTED NOT DETECTED Final  ? Staphylococcus aureus (BCID) NOT DETECTED NOT DETECTED Final  ? Staphylococcus epidermidis NOT DETECTED NOT DETECTED Final  ? Staphylococcus lugdunensis NOT DETECTED NOT DETECTED Final  ? Streptococcus species NOT DETECTED NOT DETECTED Final  ? Streptococcus agalactiae NOT DETECTED NOT DETECTED Final  ? Streptococcus pneumoniae NOT DETECTED NOT DETECTED Final  ? Streptococcus pyogenes NOT DETECTED NOT DETECTED Final  ? A.calcoaceticus-baumannii NOT DETECTED NOT DETECTED Final  ? Bacteroides fragilis NOT DETECTED NOT DETECTED Final  ? Enterobacterales NOT DETECTED NOT DETECTED Final  ? Enterobacter cloacae complex NOT DETECTED NOT DETECTED Final  ? Escherichia coli NOT DETECTED NOT DETECTED Final  ? Klebsiella aerogenes NOT DETECTED NOT DETECTED Final  ? Klebsiella oxytoca NOT DETECTED NOT DETECTED Final  ? Klebsiella pneumoniae NOT DETECTED NOT DETECTED Final  ? Proteus species NOT DETECTED NOT DETECTED Final  ? Salmonella species NOT DETECTED NOT DETECTED Final  ?  Serratia marcescens NOT DETECTED NOT DETECTED Final  ? Haemophilus influenzae NOT DETECTED NOT DETECTED Final  ? Neisseria meningitidis NOT DETECTED NOT DETECTED Final  ? Pseudomonas aeruginosa NOT DETECTED NOT DETECTED Final  ? Stenotrophomonas maltophilia NOT DETECTED NOT DETECTED Final  ? Candida albicans NOT DETECTED NOT DETECTED Final  ? Candida auris NOT DETECTED NOT DETECTED Final  ? Candida glabrata NOT DETECTED NOT DETECTED Final  ? Candida krusei NOT DETECTED NOT DETECTED Final  ? Candida parapsilosis NOT DETECTED NOT DETECTED Final  ? Candida tropicalis NOT DETECTED NOT DETECTED Final  ? Cryptococcus neoformans/gattii NOT DETECTED NOT DETECTED Final  ?  Comment: Performed at [facility] [address] [place], [place]  [address]  ?Culture, blood (routine x 2)     Status: Abnormal  ? Collection Time: 01/15/22  8:17 PM  ? Specimen: BLOOD  ?Result Value Ref Range Status  ? Specimen Description BLOOD BLOOD LEFT FOREARM  Final  ? Levy Sjogren

## 2022-01-23 NOTE — Progress Notes (Signed)
OT Cancellation Note ? ?Patient Details ?Name: Alexis Cortez ?MRN: [id number] ?DOB: July 12, 1931 ? ? ?Cancelled Treatment:    Reason Eval/Treat Not Completed: Patient declined, no reason specified. RN notified of pt's refusal ? ?Britt Bottom ?01/23/2022, 1:29 PM ?

## 2022-01-23 NOTE — Progress Notes (Signed)
? ? ?7 Days Post-Op  ?Subjective: ?CC: ?Son in room. Some soreness mainly around midline wound. Not much of an appetite but tolerating fld without increased abdominal pain, n/v. Having ostomy output. Worked with PT/OT yesterday. Had some leakage from ostomy yesterday - none this am.  ? ?Objective: ?Vital signs in last 24 hours: ?Temp:  [97.4 ?F (36.3 ?C)-99.2 ?F (37.3 ?C)] 97.4 ?F (36.3 ?C) (04/08 [id number]) ?Pulse Rate:  [67-84] 72 (04/08 0758) ?Resp:  [14-20] 14 (04/08 0758) ?BP: (121-159)/(54-90) 146/60 (04/08 0758) ?SpO2:  [98 %-100 %] 100 % (04/08 0758) ?Last BM Date : 01/22/22 ? ?Intake/Output from previous day: ?04/07 0701 - 04/08 0700 ?In: 500 [P.O.:350; IV Piggyback:150] ?Out: 1295 [Urine:600; Drains:345; Stool:350] ?Intake/Output this shift: ?No intake/output data recorded. ? ?PE: ?Gen:  Alert, NAD, pleasant ?Heart: regular ?Lungs: CTAB ?Abd: soft, appropriately tender, colostomy with viable stoma, and feculent output. Midline wound as noted below - no dehiscence. JP with serosang output ? ? ?Lab Results:  ?Recent Labs  ?  01/21/22 ?0158 01/22/22 ?0143  ?WBC 4.9 7.8  ?HGB 11.0* 9.9*  ?HCT 32.8* 30.0*  ?PLT 97* 163  ? ?BMET ?Recent Labs  ?  01/21/22 ?0158 01/22/22 ?0143  ?NA 133* 133*  ?K 4.1 4.1  ?CL 105 106  ?CO2 21* 24  ?GLUCOSE 71 80  ?BUN 15 17  ?CREATININE 1.08* 1.10*  ?CALCIUM 7.3* 6.9*  ? ?PT/INR ?No results for input(s): LABPROT, INR in the last 72 hours. ?CMP  ?   ?Component Value Date/Time  ? NA 133 (L) 01/22/2022 0143  ? K 4.1 01/22/2022 0143  ? CL 106 01/22/2022 0143  ? CO2 24 01/22/2022 0143  ? GLUCOSE 80 01/22/2022 0143  ? BUN 17 01/22/2022 0143  ? CREATININE 1.10 (H) 01/22/2022 0143  ? CALCIUM 6.9 (L) 01/22/2022 0143  ? PROT 3.7 (L) 01/22/2022 0143  ? ALBUMIN <1.5 (L) 01/22/2022 0143  ? AST 67 (H) 01/22/2022 0143  ? ALT 62 (H) 01/22/2022 0143  ? ALKPHOS 202 (H) 01/22/2022 0143  ? BILITOT 0.5 01/22/2022 0143  ? GFRNONAA 48 (L) 01/22/2022 0143  ? GFRAA >60 10/10/2019 0307  ? ?Lipase  ?    ?Component Value Date/Time  ? LIPASE 37 01/15/2022 1704  ? ? ?Studies/Results: ?ECHOCARDIOGRAM COMPLETE ? ?Result Date: 01/22/2022 ?   ECHOCARDIOGRAM REPORT   Patient Name:   Alexis Cortez Date of Exam: 01/22/2022 Medical Rec #:  [id number]      Height:       62.0 in Accession #:    [id number]     Weight:       150.0 lb Date of Birth:  Apr 15, 1931     BSA:          1.692 m? Patient Age:    88 years       BP:           153/54 mmHg Patient Gender: F              HR:           64 bpm. Exam Location:  Inpatient Procedure: 2D Echo, Cardiac Doppler and Color Doppler Indications:    Dysfunction of right cardiac ventricle  History:        Patient has prior history of Echocardiogram examinations, most                 recent 01/16/2022. Perforated colon S/P emergency surgery.  Sonographer:    Merrie Roof RDCS Referring Phys: [place]  1. Left ventricular ejection fraction, by estimation, is 60 to 65%. The left ventricle has normal function. The left ventricle has no regional wall motion abnormalities. Left ventricular diastolic parameters are indeterminate.  2. Right ventricular systolic function is normal. The right ventricular size is normal. There is normal pulmonary artery systolic pressure. The estimated right ventricular systolic pressure is 29.0 mmHg.  3. The mitral valve was not well visualized. Trivial mitral valve regurgitation. No evidence of mitral stenosis.  4. The aortic valve was not well visualized. There is mild calcification of the aortic valve. Aortic valve regurgitation is not visualized.  5. The inferior vena cava is normal in size with greater than 50% respiratory variability, suggesting right atrial pressure of 3 mmHg. Comparison(s): Prior images reviewed side by side. Changes from prior study are noted. Conclusion(s)/Recommendation(s): Both LV and RV function have normalized compared to prior. Limited windows/difficult images. FINDINGS  Left Ventricle: Left ventricular ejection  fraction, by estimation, is 60 to 65%. The left ventricle has normal function. The left ventricle has no regional wall motion abnormalities. The left ventricular internal cavity size was normal in size. There is  no left ventricular hypertrophy. Left ventricular diastolic parameters are indeterminate. Right Ventricle: The right ventricular size is normal. Right vetricular wall thickness was not well visualized. Right ventricular systolic function is normal. There is normal pulmonary artery systolic pressure. The tricuspid regurgitant velocity is 2.55 m/s, and with an assumed right atrial pressure of 3 mmHg, the estimated right ventricular systolic pressure is 29.0 mmHg. Left Atrium: Left atrial size was not well visualized. Right Atrium: Right atrial size was not well visualized. Pericardium: There is no evidence of pericardial effusion. Mitral Valve: The mitral valve was not well visualized. Trivial mitral valve regurgitation. No evidence of mitral valve stenosis. Tricuspid Valve: The tricuspid valve is not well visualized. Tricuspid valve regurgitation is trivial. No evidence of tricuspid stenosis. Aortic Valve: The aortic valve was not well visualized. There is mild calcification of the aortic valve. Aortic valve regurgitation is not visualized. Aortic valve mean gradient measures 3.0 mmHg. Aortic valve peak gradient measures 5.6 mmHg. Aortic valve area, by VTI measures 2.98 cm?. Pulmonic Valve: The pulmonic valve was not well visualized. Pulmonic valve regurgitation is trivial. No evidence of pulmonic stenosis. Aorta: The aortic root was not well visualized, the ascending aorta was not well visualized and the aortic arch was not well visualized. Venous: The inferior vena cava is normal in size with greater than 50% respiratory variability, suggesting right atrial pressure of 3 mmHg. IAS/Shunts: The interatrial septum was not well visualized.  LEFT VENTRICLE PLAX 2D LVIDd:         4.20 cm   Diastology LVIDs:          2.40 cm   LV e' medial:    4.68 cm/s LV PW:         1.00 cm   LV E/e' medial:  13.7 LV IVS:        1.00 cm   LV e' lateral:   4.24 cm/s LVOT diam:     2.00 cm   LV E/e' lateral: 15.1 LV SV:         73 LV SV Index:   43 LVOT Area:     3.14 cm?  RIGHT VENTRICLE RV Basal diam:  3.40 cm RV S prime:     13.30 cm/s TAPSE (M-mode): 1.5 cm LEFT ATRIUM             Index  RIGHT ATRIUM           Index LA diam:        3.80 cm 2.25 cm/m?   RA Area:     13.00 cm? LA Vol (A2C):   29.0 ml 17.14 ml/m?  RA Volume:   26.60 ml  15.72 ml/m? LA Vol (A4C):   30.3 ml 17.91 ml/m? LA Biplane Vol: 30.3 ml 17.91 ml/m?  AORTIC VALVE AV Area (Vmax):    3.06 cm? AV Area (Vmean):   2.84 cm? AV Area (VTI):     2.98 cm? AV Vmax:           118.00 cm/s AV Vmean:          85.700 cm/s AV VTI:            0.246 m AV Peak Grad:      5.6 mmHg AV Mean Grad:      3.0 mmHg LVOT Vmax:         115.00 cm/s LVOT Vmean:        77.400 cm/s LVOT VTI:          0.233 m LVOT/AV VTI ratio: 0.95  AORTA Ao Root diam: 3.00 cm MITRAL VALVE                TRICUSPID VALVE MV Area (PHT): 1.94 cm?     TR Peak grad:   26.0 mmHg MV Decel Time: 391 msec     TR Vmax:        255.00 cm/s MV E velocity: 64.00 cm/s MV A velocity: 129.00 cm/s  SHUNTS MV E/A ratio:  0.50         Systemic VTI:  0.23 m                             Systemic Diam: 2.00 cm Buford Dresser MD Electronically signed by Buford Dresser MD Signature Date/Time: 01/22/2022/7:34:00 PM    Final    ? ?Anti-infectives: ?Anti-infectives (From admission, onward)  ? ? Start     Dose/Rate Route Frequency Ordered Stop  ? 01/19/22 1400  piperacillin-tazobactam (ZOSYN) IVPB 3.375 g       ? 3.375 g ?12.5 mL/hr over 240 Minutes Intravenous Every 8 hours 01/19/22 0842 01/23/22 0940  ? 01/17/22 1400  piperacillin-tazobactam (ZOSYN) IVPB 2.25 g  Status:  Discontinued       ? 2.25 g ?100 mL/hr over 30 Minutes Intravenous Every 8 hours 01/17/22 0725 01/19/22 0842  ? 01/16/22 1800  piperacillin-tazobactam (ZOSYN) IVPB  2.25 g  Status:  Discontinued       ? 2.25 g ?100 mL/hr over 30 Minutes Intravenous Every 6 hours 01/16/22 1420 01/17/22 0725  ? 01/16/22 1000  piperacillin-tazobactam (ZOSYN) IVPB 3.375 g  Status:  Discontinued

## 2022-01-23 NOTE — TOC Progression Note (Signed)
Transition of Care (TOC) - Progression Note  ? ? ?Patient Details  ?Name: Alexis Cortez ?MRN: [id number] ?Date of Birth: Mar 18, 1931 ? ?Transition of Care (TOC) CM/SW Contact  ?Patrice Paradise, LCSW ?Phone [phone] ?01/23/2022, 4:55 PM ? ?Clinical Narrative:    ? ?CSW spoke with son who was in pt's room about the bed offers. Son asked if we had heard anything about the [facility]. CSW stated that she would try to find out and give them a call back tomorrow. Son was in agreement with plan. ? ?TOC team will continue to assist with discharge planning needs.  ? ?Expected Discharge Plan: Skilled Nursing Facility ?Barriers to Discharge: Continued Medical Work up ? ?Expected Discharge Plan and Services ?Expected Discharge Plan: Skilled Nursing Facility ?  ?  ?Post Acute Care Choice: Skilled Nursing Facility ?Living arrangements for the past 2 months: Assisted Living Facility ?                ?  ?  ?  ?  ?  ?  ?  ?  ?  ?  ? ? ?Social Determinants of Health (SDOH) Interventions ?  ? ?Readmission Risk Interventions ?   ? View : No data to display.  ?  ?  ?  ? ? ?

## 2022-01-24 DIAGNOSIS — K668 Other specified disorders of peritoneum: Secondary | ICD-10-CM | POA: Diagnosis not present

## 2022-01-24 LAB — BASIC METABOLIC PANEL
Anion gap: 5 (ref 5–15)
BUN: 19 mg/dL (ref 8–23)
CO2: 20 mmol/L — ABNORMAL LOW (ref 22–32)
Calcium: 6.9 mg/dL — ABNORMAL LOW (ref 8.9–10.3)
Chloride: 107 mmol/L (ref 98–111)
Creatinine, Ser: 0.83 mg/dL (ref 0.44–1.00)
GFR, Estimated: >60 mL/min (ref >60)
Glucose, Bld: 98 mg/dL (ref 70–99)
Potassium: 4.3 mmol/L (ref 3.5–5.1)
Sodium: 132 mmol/L — ABNORMAL LOW (ref 135–145)

## 2022-01-24 LAB — CBC
HCT: 31.6 % — ABNORMAL LOW (ref 36.0–46.0)
Hemoglobin: 10.1 g/dL — ABNORMAL LOW (ref 12.0–15.0)
MCH: 29.6 pg (ref 26.0–34.0)
MCHC: 32 g/dL (ref 30.0–36.0)
MCV: 92.7 fL (ref 80.0–100.0)
Platelets: 288 10*3/uL (ref 150–400)
RBC: 3.41 MIL/uL — ABNORMAL LOW (ref 3.87–5.11)
RDW: 14.6 % (ref 11.5–15.5)
WBC: 14.3 10*3/uL — ABNORMAL HIGH (ref 4.0–10.5)
nRBC: 0 % (ref 0.0–0.2)

## 2022-01-24 LAB — GLUCOSE, CAPILLARY
Glucose-Capillary: 93 mg/dL (ref 70–99)
Glucose-Capillary: 76 mg/dL (ref 70–99)
Glucose-Capillary: 84 mg/dL (ref 70–99)
Glucose-Capillary: 67 mg/dL — ABNORMAL LOW (ref 70–99)
Glucose-Capillary: 81 mg/dL (ref 70–99)
Glucose-Capillary: 85 mg/dL (ref 70–99)
Glucose-Capillary: 106 mg/dL — ABNORMAL HIGH (ref 70–99)

## 2022-01-24 MED ORDER — DEXTROSE IN LACTATED RINGERS 5 % IV SOLN
INTRAVENOUS | Status: DC
Start: 2022-01-24 — End: 2022-01-28
  Administered 2022-01-28: 50 mL via INTRAVENOUS

## 2022-01-24 NOTE — TOC Progression Note (Signed)
Transition of Care (TOC) - Progression Note  ? ? ?Patient Details  ?Name: Alexis Cortez ?MRN: [id number] ?Date of Birth: Jun 14, 1931 ? ?Transition of Care (TOC) CM/SW Contact  ?Bary Castilla, LCSW ?Phone Number:[phone] ?01/24/2022, 2:19 PM ? ?Clinical Narrative:    ? ?CSW followed up again with son [name] to provide bed offers. [name] explained that he had found pt's LTC policy and wanted to review it. CSW explained that pt is being recommended for SNF and facility could assist with LTC if still needed. ? ?CSW provided [name] with the six choices from facilities that offered pt a bed. [name] was unable to write them down therefore CSW emailed them to him as well as the medicare.gov rating list. [name] stated that he would look them over. ? ?Bed offers given: ?[facility] ?[facility] ?[facility] ?[facility] ?[facility] ?[facility] ? ?TOC team will continue to assist with discharge planning needs.  ? ? ? ? ?Expected Discharge Plan: [place] ?Barriers to Discharge: Continued Medical Work up ? ?Expected Discharge Plan and Services ?Expected Discharge Plan: [place] ?  ?  ?Post Acute Care Choice: [place] ?Living arrangements for the past 2 months: [place] ?                ?  ?  ?  ?  ?  ?  ?  ?  ?  ?  ? ? ?Social Determinants of Health (SDOH) Interventions ?  ? ?Readmission Risk Interventions ?   ? View : No data to display.  ?  ?  ?  ? ? ?

## 2022-01-24 NOTE — Progress Notes (Addendum)
? ? ?8 Days Post-Op  ?Subjective: ?CC: ?Had some pain last night in her central abdomen after PO intake that resolved with pain meds. Reports no abdominal pain today. Eating oatmeal. No n/v. Ostomy functioning. No complaints.  ? ?Objective: ?Vital signs in last 24 hours: ?Temp:  [97.8 ?F (36.6 ?C)-99.2 ?F (37.3 ?C)] 98 ?F (36.7 ?C) (04/09 0831) ?Pulse Rate:  [60-90] 68 (04/09 0831) ?Resp:  [17-20] 17 (04/09 0831) ?BP: (143-168)/(58-61) 149/59 (04/09 0831) ?SpO2:  [92 %-100 %] 100 % (04/09 0831) ?Last BM Date : 01/22/22 ? ?Intake/Output from previous day: ?04/08 0701 - 04/09 0700 ?In: -  ?Out: 24 [Drains:80] ?Intake/Output this shift: ?No intake/output data recorded. ? ?PE: ?Gen:  Alert, NAD, pleasant ?Heart: regular ?Lungs: CTAB ?Abd: soft, appropriately tender, colostomy with viable stoma, and feculent output. Midline wound stable compared to yesterday - some thin clear drainage. No obvious dehiscence. JP with serosang output ? ?Lab Results:  ?Recent Labs  ?  01/22/22 ?0143 01/24/22 ?0157  ?WBC 7.8 14.3*  ?HGB 9.9* 10.1*  ?HCT 30.0* 31.6*  ?PLT 163 288  ? ?BMET ?Recent Labs  ?  01/22/22 ?0143 01/24/22 ?0157  ?NA 133* 132*  ?K 4.1 4.3  ?CL 106 107  ?CO2 24 20*  ?GLUCOSE 80 98  ?BUN 17 19  ?CREATININE 1.10* 0.83  ?CALCIUM 6.9* 6.9*  ? ?PT/INR ?No results for input(s): LABPROT, INR in the last 72 hours. ?CMP  ?   ?Component Value Date/Time  ? NA 132 (L) 01/24/2022 0157  ? K 4.3 01/24/2022 0157  ? CL 107 01/24/2022 0157  ? CO2 20 (L) 01/24/2022 0157  ? GLUCOSE 98 01/24/2022 0157  ? BUN 19 01/24/2022 0157  ? CREATININE 0.83 01/24/2022 0157  ? CALCIUM 6.9 (L) 01/24/2022 0157  ? PROT 3.7 (L) 01/22/2022 0143  ? ALBUMIN <1.5 (L) 01/22/2022 0143  ? AST 67 (H) 01/22/2022 0143  ? ALT 62 (H) 01/22/2022 0143  ? ALKPHOS 202 (H) 01/22/2022 0143  ? BILITOT 0.5 01/22/2022 0143  ? GFRNONAA >60 01/24/2022 0157  ? GFRAA >60 10/10/2019 0307  ? ?Lipase  ?   ?Component Value Date/Time  ? LIPASE 37 01/15/2022 1704   ? ? ?Studies/Results: ?ECHOCARDIOGRAM COMPLETE ? ?Result Date: 01/22/2022 ?   ECHOCARDIOGRAM REPORT   Patient Name:   Alexis Cortez Date of Exam: 01/22/2022 Medical Rec #:  [id number]      Height:       62.0 in Accession #:    [id number]     Weight:       150.0 lb Date of Birth:  1931/01/20     BSA:          1.692 m? Patient Age:    88 years       BP:           153/54 mmHg Patient Gender: F              HR:           64 bpm. Exam Location:  Inpatient Procedure: 2D Echo, Cardiac Doppler and Color Doppler Indications:    Dysfunction of right cardiac ventricle  History:        Patient has prior history of Echocardiogram examinations, most                 recent 01/16/2022. Perforated colon S/P emergency surgery.  Sonographer:    Merrie Roof RDCS Referring Phys: [place]  1. Left ventricular ejection fraction, by estimation, is  60 to 65%. The left ventricle has normal function. The left ventricle has no regional wall motion abnormalities. Left ventricular diastolic parameters are indeterminate.  2. Right ventricular systolic function is normal. The right ventricular size is normal. There is normal pulmonary artery systolic pressure. The estimated right ventricular systolic pressure is 0000000 mmHg.  3. The mitral valve was not well visualized. Trivial mitral valve regurgitation. No evidence of mitral stenosis.  4. The aortic valve was not well visualized. There is mild calcification of the aortic valve. Aortic valve regurgitation is not visualized.  5. The inferior vena cava is normal in size with greater than 50% respiratory variability, suggesting right atrial pressure of 3 mmHg. Comparison(s): Prior images reviewed side by side. Changes from prior study are noted. Conclusion(s)/Recommendation(s): Both LV and RV function have normalized compared to prior. Limited windows/difficult images. FINDINGS  Left Ventricle: Left ventricular ejection fraction, by estimation, is 60 to 65%. The left ventricle has  normal function. The left ventricle has no regional wall motion abnormalities. The left ventricular internal cavity size was normal in size. There is  no left ventricular hypertrophy. Left ventricular diastolic parameters are indeterminate. Right Ventricle: The right ventricular size is normal. Right vetricular wall thickness was not well visualized. Right ventricular systolic function is normal. There is normal pulmonary artery systolic pressure. The tricuspid regurgitant velocity is 2.55 m/s, and with an assumed right atrial pressure of 3 mmHg, the estimated right ventricular systolic pressure is 0000000 mmHg. Left Atrium: Left atrial size was not well visualized. Right Atrium: Right atrial size was not well visualized. Pericardium: There is no evidence of pericardial effusion. Mitral Valve: The mitral valve was not well visualized. Trivial mitral valve regurgitation. No evidence of mitral valve stenosis. Tricuspid Valve: The tricuspid valve is not well visualized. Tricuspid valve regurgitation is trivial. No evidence of tricuspid stenosis. Aortic Valve: The aortic valve was not well visualized. There is mild calcification of the aortic valve. Aortic valve regurgitation is not visualized. Aortic valve mean gradient measures 3.0 mmHg. Aortic valve peak gradient measures 5.6 mmHg. Aortic valve area, by VTI measures 2.98 cm?. Pulmonic Valve: The pulmonic valve was not well visualized. Pulmonic valve regurgitation is trivial. No evidence of pulmonic stenosis. Aorta: The aortic root was not well visualized, the ascending aorta was not well visualized and the aortic arch was not well visualized. Venous: The inferior vena cava is normal in size with greater than 50% respiratory variability, suggesting right atrial pressure of 3 mmHg. IAS/Shunts: The interatrial septum was not well visualized.  LEFT VENTRICLE PLAX 2D LVIDd:         4.20 cm   Diastology LVIDs:         2.40 cm   LV e' medial:    4.68 cm/s LV PW:         1.00  cm   LV E/e' medial:  13.7 LV IVS:        1.00 cm   LV e' lateral:   4.24 cm/s LVOT diam:     2.00 cm   LV E/e' lateral: 15.1 LV SV:         73 LV SV Index:   43 LVOT Area:     3.14 cm?  RIGHT VENTRICLE RV Basal diam:  3.40 cm RV S prime:     13.30 cm/s TAPSE (M-mode): 1.5 cm LEFT ATRIUM             Index        RIGHT ATRIUM  Index LA diam:        3.80 cm 2.25 cm/m?   RA Area:     13.00 cm? LA Vol (A2C):   29.0 ml 17.14 ml/m?  RA Volume:   26.60 ml  15.72 ml/m? LA Vol (A4C):   30.3 ml 17.91 ml/m? LA Biplane Vol: 30.3 ml 17.91 ml/m?  AORTIC VALVE AV Area (Vmax):    3.06 cm? AV Area (Vmean):   2.84 cm? AV Area (VTI):     2.98 cm? AV Vmax:           118.00 cm/s AV Vmean:          85.700 cm/s AV VTI:            0.246 m AV Peak Grad:      5.6 mmHg AV Mean Grad:      3.0 mmHg LVOT Vmax:         115.00 cm/s LVOT Vmean:        77.400 cm/s LVOT VTI:          0.233 m LVOT/AV VTI ratio: 0.95  AORTA Ao Root diam: 3.00 cm MITRAL VALVE                TRICUSPID VALVE MV Area (PHT): 1.94 cm?     TR Peak grad:   26.0 mmHg MV Decel Time: 391 msec     TR Vmax:        255.00 cm/s MV E velocity: 64.00 cm/s MV A velocity: 129.00 cm/s  SHUNTS MV E/A ratio:  0.50         Systemic VTI:  0.23 m                             Systemic Diam: 2.00 cm Buford Dresser MD Electronically signed by Buford Dresser MD Signature Date/Time: 01/22/2022/7:34:00 PM    Final    ? ?Anti-infectives: ?Anti-infectives (From admission, onward)  ? ? Start     Dose/Rate Route Frequency Ordered Stop  ? 01/19/22 1400  piperacillin-tazobactam (ZOSYN) IVPB 3.375 g       ? 3.375 g ?12.5 mL/hr over 240 Minutes Intravenous Every 8 hours 01/19/22 0842 01/23/22 0940  ? 01/17/22 1400  piperacillin-tazobactam (ZOSYN) IVPB 2.25 g  Status:  Discontinued       ? 2.25 g ?100 mL/hr over 30 Minutes Intravenous Every 8 hours 01/17/22 0725 01/19/22 0842  ? 01/16/22 1800  piperacillin-tazobactam (ZOSYN) IVPB 2.25 g  Status:  Discontinued       ? 2.25 g ?100 mL/hr  over 30 Minutes Intravenous Every 6 hours 01/16/22 1420 01/17/22 0725  ? 01/16/22 1000  piperacillin-tazobactam (ZOSYN) IVPB 3.375 g  Status:  Discontinued       ? 3.375 g ?12.5 mL/hr over 240 Minutes Intravenous Every 12 hours 03

## 2022-01-24 NOTE — TOC Progression Note (Deleted)
Transition of Care (TOC) - Progression Note  ? ? ?Patient Details  ?Name: Alexis Cortez ?MRN: [id number] ?Date of Birth: 1931/09/11 ? ?Transition of Care (TOC) CM/SW Contact  ?Patrice Paradise, LCSW ?Phone Number: [phone] ?01/24/2022, 12:43 PM ? ?Clinical Narrative:    ? ?CSW was alerted that pt[facility].CSW spoke with son Gery Pray and he confirmed that he would like pt to go [facility]. ? ?CSW spoke with Lanora Manis at [facility] and made a referral. ? ?TOC team will continue to assist with discharge planning needs.   ? ?Expected Discharge Plan: Skilled Nursing Facility ?Barriers to Discharge: Continued Medical Work up ? ?Expected Discharge Plan and Services ?Expected Discharge Plan: Skilled Nursing Facility ?  ?  ?Post Acute Care Choice: Skilled Nursing Facility ?Living arrangements for the past 2 months: Assisted Living Facility ?                ?  ?  ?  ?  ?  ?  ?  ?  ?  ?  ? ? ?Social Determinants of Health (SDOH) Interventions ?  ? ?Readmission Risk Interventions ?   ? View : No data to display.  ?  ?  ?  ? ? ?

## 2022-01-24 NOTE — Progress Notes (Signed)
? ?PROGRESS NOTE ? ? ? ?Alexis Cortez  [id number] DOB: 12-28-1930 DOA: 01/15/2022 ?PCP: April Manson, NP ? ? ?Brief Narrative: ?88 year old female with history of HTN, anxiety, cognitive impairment and ambulatory dysfunction with rolling walker dependence presenting with abdominal pain, constipation, poor p.o. intake and admitted by general surgery on 01/15/2022 with working diagnosis of pneumoperitoneum as noted on CT abdomen and pelvis.  She was started on IV Zosyn.  She underwent sigmoid colectomy with end colostomy on 4/1 for perforated sigmoid colon with peritonitis. Hospitalist consulted for medical care. ? ? ? ? ?Assessment and Plan: ?* Pneumoperitoneum/perforated sigmoid colon ?S/p sigmoid colectomy and end colostomy by Dwain Sarna.  ?-Surgical wound care, JP drain and colostomy care per general surgery. ?-Remains on IV Zosyn per general surgery. ?-Encourage incentive spirometry/mobility ?-tylenol scheduled ?-WBC up slightly-- will trend- no fever but on schedule tylenol ? ?Goals of care, counseling/discussion ?Per Dr. Alanda Slim: Extensive discussion about CODE STATUS and pros and cons of CPR and intubation with patient's son at bedside.  Patient has some cognitive impairment but oriented to self, person and place.  Although she does not have a good recollection of what happened, she seemed to comprehend the conversation.  After risk and benefit discussion, she does not want CPR or further intubation.  Patient's son is in agreement. ? ? ?Acute respiratory failure (HCC) ?Patient required mechanical ventilation and ICU stay postoperatively.  Extubated on 4/3.  Currently on room air. ?-Encourage incentive spirometry ? ?AKI (acute kidney injury) (HCC) ?-Cr.  0.96 in 05/2020 ?-Avoid nephrotoxic meds. ? ? ?Bacteremia ?Blood culture on 3/31 with bacteroids thetaiotaomicron.  Likely GI translocation. ?-Continue IV Zosyn ? ?Abnormal echocardiogram ?Limited TTE on 4/1 with probably mild to moderately decreased LVEF and  dilated RV with what appears to be severely reduced RVSF but very limited window.  Overall, she appears euvolemic except for RLE swelling from infiltrated line.  Currently on room air.  No respiratory distress. ?-Monitor intake and output ?-Repeated Transthoracic Echocardiogram w/o issues ? ?Hypocalcemia ?Corrects to normal for hypoalbuminemia. ? ?Thrombocytopenia (HCC) ?Likely secondary to acute illness. Now resolved. ? ?Hypoalbuminemia ?-Encourage PO intake, advance diet ?-Dietitian consult ? ?Normocytic anemia ?Likely due to acute illness and some surgical blood loss. Stable. No evidence of bleeding. Associated thrombocytopenia has resolved. ? ? ?Hyponatremia ?Mild and stable. Asymptomatic. ? ?Cognitive impairment ?Oriented to self, person and place but not time.  Dependent for most ADL's. ?-Reorientation and delirium precautions. ? ?Essential hypertension ?Initially normotensive off home amlodipine and lisinopril. Now with increasing blood pressure ?-Continue holding both ?-Hydralazine PO for now; restart home medication if blood pressure continues to be elevated and/or patient is requiring hydralazine PRN for blood pressure control ? ?Septic shock (HCC) ?Likely due to perforated bowel and bacteremia.  Resolved. ? ? ? ?DVT prophylaxis: Per primary: Heparin subq ?Code Status:   Code Status: DNR ?Family Communication: Son at bedside ?Disposition Plan: Per primary ? ? ?Procedures:  ?Sigmoid colectomy ?Echo ? ?Antimicrobials: ?Zosyn IV  ? ? ?Subjective: ?Actually hungry today ? ?Objective: ?BP (!) 149/59 (BP Location: Left Leg)   Pulse 68   Temp 98 ?F (36.7 ?C) (Oral)   Resp 17   Ht 5\' 2"  (1.575 m)   Wt 68 kg   SpO2 100%   BMI 27.44 kg/m?  ? ?Examination: ? ? ? ?General: Appearance:     ?Overweight female in no acute distress  ?   ?Lungs:     respirations unlabored  ?Heart:    Normal heart rate.  ?  ?  MS:   All extremities are intact.  ?  ?Neurologic:   Awake, alert   ?  ?  ? ? ?Data Reviewed: I have  personally reviewed following labs and imaging studies ? ?CBC ?Lab Results  ?Component Value Date  ? WBC 14.3 (H) 01/24/2022  ? RBC 3.41 (L) 01/24/2022  ? HGB 10.1 (L) 01/24/2022  ? HCT 31.6 (L) 01/24/2022  ? MCV 92.7 01/24/2022  ? MCH 29.6 01/24/2022  ? PLT 288 01/24/2022  ? MCHC 32.0 01/24/2022  ? RDW 14.6 01/24/2022  ? LYMPHSABS 1.3 01/15/2022  ? MONOABS 0.3 01/15/2022  ? EOSABS 0.1 01/15/2022  ? BASOSABS 0.0 01/15/2022  ? ? ? ?Last metabolic panel ?Lab Results  ?Component Value Date  ? NA 132 (L) 01/24/2022  ? K 4.3 01/24/2022  ? CL 107 01/24/2022  ? CO2 20 (L) 01/24/2022  ? BUN 19 01/24/2022  ? CREATININE 0.83 01/24/2022  ? GLUCOSE 98 01/24/2022  ? GFRNONAA >60 01/24/2022  ? GFRAA >60 10/10/2019  ? CALCIUM 6.9 (L) 01/24/2022  ? PHOS 2.3 (L) 01/21/2022  ? PROT 3.7 (L) 01/22/2022  ? ALBUMIN <1.5 (L) 01/22/2022  ? BILITOT 0.5 01/22/2022  ? ALKPHOS 202 (H) 01/22/2022  ? AST 67 (H) 01/22/2022  ? ALT 62 (H) 01/22/2022  ? ANIONGAP 5 01/24/2022  ? ? ?GFR: ?Estimated Creatinine Clearance: 42.4 mL/min (by C-G formula based on SCr of 0.83 mg/dL). ? ?Recent Results (from the past 240 hour(s))  ?Culture, blood (routine x 2)     Status: Abnormal  ? Collection Time: 01/15/22  7:38 PM  ? Specimen: BLOOD  ?Result Value Ref Range Status  ? Specimen Description BLOOD RIGHT ANTECUBITAL  Final  ? Special Requests   Final  ?  BOTTLES DRAWN AEROBIC AND ANAEROBIC Blood Culture adequate volume  ? Culture  Setup Time (A)  Final  ?  GRAM VARIABLE ROD ?ANAEROBIC BOTTLE ONLY ?CRITICAL RESULT CALLED TO, READ BACK BY AND VERIFIED WITH: PHARM D C.PIERCE ON [id number] AT 1420 BY E.PARRISH ?  ? Culture (A)  Final  ?  BACTEROIDES THETAIOTAOMICRON ?BETA LACTAMASE POSITIVE ?EGGERTHELLA LENTA ?Standardized susceptibility testing for this organism is not available. ?Performed at [facility], [address], [place], [place] [address] ?  ? Report Status 01/20/2022 FINAL  Final  ?Blood Culture ID Panel (Reflexed)     Status: None  ? Collection  Time: 01/15/22  7:38 PM  ?Result Value Ref Range Status  ? Enterococcus faecalis NOT DETECTED NOT DETECTED Final  ? Enterococcus Faecium NOT DETECTED NOT DETECTED Final  ? Listeria monocytogenes NOT DETECTED NOT DETECTED Final  ? Staphylococcus species NOT DETECTED NOT DETECTED Final  ? Staphylococcus aureus (BCID) NOT DETECTED NOT DETECTED Final  ? Staphylococcus epidermidis NOT DETECTED NOT DETECTED Final  ? Staphylococcus lugdunensis NOT DETECTED NOT DETECTED Final  ? Streptococcus species NOT DETECTED NOT DETECTED Final  ? Streptococcus agalactiae NOT DETECTED NOT DETECTED Final  ? Streptococcus pneumoniae NOT DETECTED NOT DETECTED Final  ? Streptococcus pyogenes NOT DETECTED NOT DETECTED Final  ? A.calcoaceticus-baumannii NOT DETECTED NOT DETECTED Final  ? Bacteroides fragilis NOT DETECTED NOT DETECTED Final  ? Enterobacterales NOT DETECTED NOT DETECTED Final  ? Enterobacter cloacae complex NOT DETECTED NOT DETECTED Final  ? Escherichia coli NOT DETECTED NOT DETECTED Final  ? Klebsiella aerogenes NOT DETECTED NOT DETECTED Final  ? Klebsiella oxytoca NOT DETECTED NOT DETECTED Final  ? Klebsiella pneumoniae NOT DETECTED NOT DETECTED Final  ? Proteus species NOT DETECTED NOT DETECTED Final  ?  Salmonella species NOT DETECTED NOT DETECTED Final  ? Serratia marcescens NOT DETECTED NOT DETECTED Final  ? Haemophilus influenzae NOT DETECTED NOT DETECTED Final  ? Neisseria meningitidis NOT DETECTED NOT DETECTED Final  ? Pseudomonas aeruginosa NOT DETECTED NOT DETECTED Final  ? Stenotrophomonas maltophilia NOT DETECTED NOT DETECTED Final  ? Candida albicans NOT DETECTED NOT DETECTED Final  ? Candida auris NOT DETECTED NOT DETECTED Final  ? Candida glabrata NOT DETECTED NOT DETECTED Final  ? Candida krusei NOT DETECTED NOT DETECTED Final  ? Candida parapsilosis NOT DETECTED NOT DETECTED Final  ? Candida tropicalis NOT DETECTED NOT DETECTED Final  ? Cryptococcus neoformans/gattii NOT DETECTED NOT DETECTED Final  ?  Comment:  Performed at [facility], [address] [place], [place] [id number]  ?Culture, blood (routine x 2)     Status: Abnormal  ? Collection Time: 01/15/22  8:17 PM  ? Specimen: BLOOD  ?Result Value Ref Range Status

## 2022-01-25 DIAGNOSIS — K668 Other specified disorders of peritoneum: Secondary | ICD-10-CM | POA: Diagnosis not present

## 2022-01-25 DIAGNOSIS — R7881 Bacteremia: Secondary | ICD-10-CM | POA: Diagnosis not present

## 2022-01-25 DIAGNOSIS — E871 Hypo-osmolality and hyponatremia: Secondary | ICD-10-CM | POA: Diagnosis not present

## 2022-01-25 LAB — BASIC METABOLIC PANEL
Anion gap: 4 — ABNORMAL LOW (ref 5–15)
BUN: 24 mg/dL — ABNORMAL HIGH (ref 8–23)
CO2: 22 mmol/L (ref 22–32)
Calcium: 7.3 mg/dL — ABNORMAL LOW (ref 8.9–10.3)
Chloride: 108 mmol/L (ref 98–111)
Creatinine, Ser: 0.81 mg/dL (ref 0.44–1.00)
GFR, Estimated: >60 mL/min (ref >60)
Glucose, Bld: 92 mg/dL (ref 70–99)
Potassium: 4.5 mmol/L (ref 3.5–5.1)
Sodium: 134 mmol/L — ABNORMAL LOW (ref 135–145)

## 2022-01-25 LAB — GLUCOSE, CAPILLARY
Glucose-Capillary: 82 mg/dL (ref 70–99)
Glucose-Capillary: 83 mg/dL (ref 70–99)
Glucose-Capillary: 92 mg/dL (ref 70–99)

## 2022-01-25 LAB — CBC
HCT: 32 % — ABNORMAL LOW (ref 36.0–46.0)
Hemoglobin: 10.3 g/dL — ABNORMAL LOW (ref 12.0–15.0)
MCH: 30 pg (ref 26.0–34.0)
MCHC: 32.2 g/dL (ref 30.0–36.0)
MCV: 93.3 fL (ref 80.0–100.0)
Platelets: 383 10*3/uL (ref 150–400)
RBC: 3.43 MIL/uL — ABNORMAL LOW (ref 3.87–5.11)
RDW: 15.2 % (ref 11.5–15.5)
WBC: 12.8 10*3/uL — ABNORMAL HIGH (ref 4.0–10.5)
nRBC: 0 % (ref 0.0–0.2)

## 2022-01-25 LAB — HEMOGLOBIN AND HEMATOCRIT, BLOOD
HCT: 32.8 % — ABNORMAL LOW (ref 36.0–46.0)
Hemoglobin: 10.4 g/dL — ABNORMAL LOW (ref 12.0–15.0)

## 2022-01-25 MED ORDER — BUSPIRONE HCL 5 MG PO TABS
7.5000 mg | ORAL_TABLET | Freq: Two times a day (BID) | ORAL | Status: DC
  Administered 2022-01-25 – 2022-01-29 (×9): 7.5 mg via ORAL
  Filled 2022-01-25 (×9): qty 2

## 2022-01-25 MED ORDER — MELATONIN 3 MG PO TABS
3.0000 mg | ORAL_TABLET | Freq: Every day | ORAL | Status: DC
  Administered 2022-01-25 – 2022-01-28 (×4): 3 mg via ORAL
  Filled 2022-01-25 (×4): qty 1

## 2022-01-25 MED ORDER — OLANZAPINE 2.5 MG PO TABS
2.5000 mg | ORAL_TABLET | Freq: Every day | ORAL | Status: DC
  Administered 2022-01-25 – 2022-01-28 (×4): 2.5 mg via ORAL
  Filled 2022-01-25 (×5): qty 1

## 2022-01-25 NOTE — Progress Notes (Signed)
Patient had small BM with alot of bright red blood covering entire washcloth. Will continue to monitor patient, Ccs aware, no new orders at this time. ?

## 2022-01-25 NOTE — Care Management Important Message (Signed)
Important Message ? ?Patient Details  ?Name: Alexis Cortez ?MRN: [id number] ?Date of Birth: 07-Nov-1930 ? ? ?Medicare Important Message Given:  Yes ? ? ? ? ?Shelda Altes ?01/25/2022, 8:57 AM ?

## 2022-01-25 NOTE — Progress Notes (Signed)
? ?PROGRESS NOTE ? ? ? ?Alexis Cortez  [id number] DOB: Mar 04, 1931 DOA: 01/15/2022 ?PCP: Jettie Booze, NP ? ? ?Brief Narrative: ?88 year old female with history of HTN, anxiety, cognitive impairment and ambulatory dysfunction with rolling walker dependence presenting with abdominal pain, constipation, poor p.o. intake and admitted by general surgery on 01/15/2022 with working diagnosis of pneumoperitoneum as noted on CT abdomen and pelvis.  She was started on IV Zosyn.  She underwent sigmoid colectomy with end colostomy on 4/1 for perforated sigmoid colon with peritonitis. Hospitalist consulted for medical care. ? ? ? ? ?Assessment and Plan: ?* Pneumoperitoneum/perforated sigmoid colon ?S/p sigmoid colectomy and end colostomy by Donne Hazel.  ?-Surgical wound care, JP drain and colostomy care per general surgery. ?-Encourage incentive spirometry/mobility ?-tylenol scheduled ?-WBC up slightly-- will trend- no fever but on schedule tylenol ? ?Goals of care, counseling/discussion ?Per Dr. Cyndia Skeeters: Extensive discussion about CODE STATUS and pros and cons of CPR and intubation with patient's son at bedside.  Patient has some cognitive impairment but oriented to self, person and place.  Although she does not have a good recollection of what happened, she seemed to comprehend the conversation.  After risk and benefit discussion, she does not want CPR or further intubation.  Patient's son is in agreement. ? ? ?Acute respiratory failure ([place]) ?Patient required mechanical ventilation and ICU stay postoperatively.  Extubated on 4/3.  Currently on room air. ?-Encourage incentive spirometry ? ?AKI (acute kidney injury) ([place]) ?-Cr.  0.96 in 05/2020 ?-Avoid nephrotoxic meds. ? ? ?Bacteremia ?Blood culture on 3/31 with bacteroids thetaiotaomicron.  Likely GI translocation. ?-treated Zosyn ? ?Abnormal echocardiogram ?Limited TTE on 4/1 with probably mild to moderately decreased LVEF and dilated RV with what appears to be severely  reduced RVSF but very limited window.  Overall, she appears euvolemic except for RLE swelling from infiltrated line.  Currently on room air.  No respiratory distress. ?-Monitor intake and output ?-Repeated Transthoracic Echocardiogram w/o issues ? ?Hypocalcemia ?Corrects to normal for hypoalbuminemia. ? ?Thrombocytopenia ([place]) ?Likely secondary to acute illness. Now resolved. ? ?Hypoalbuminemia ?-Encourage PO intake, advance diet ?-Dietitian consult ? ?Normocytic anemia ?Likely due to acute illness and some surgical blood loss. Stable. No evidence of bleeding. Associated thrombocytopenia has resolved. ? ? ?Hyponatremia ?Mild and stable. Asymptomatic. ? ?Cognitive impairment ?Oriented to self, person and place but not time.  Dependent for most ADL's. ?-Reorientation and delirium precautions. ?-resume home medications ? ?Essential hypertension ?Initially normotensive off home amlodipine and lisinopril. Now with increasing blood pressure ?-Continue holding both ?-Hydralazine PO for now; restart home medication if blood pressure continues to be elevated and/or patient is requiring hydralazine PRN for blood pressure control ? ?Septic shock ([place]) ?Likely due to perforated bowel and bacteremia.  Resolved. ? ?Hypoglycemia ?-IVF until taking in POs better ?-asked nursing to document percentage eaten ? ? ? ?DVT prophylaxis: Per primary: Heparin subq ?Code Status:   Code Status: DNR ?Family Communication: Son at bedside ?Disposition Plan: Per primary ? ? ?Procedures:  ?Sigmoid colectomy ?Echo ? ?Antimicrobials: ?Zosyn IV  ? ? ?Subjective: ?Did not sleep well last PM ? ?Objective: ?BP (!) 147/51 (BP Location: Left Leg)   Pulse 60   Temp 98.7 ?F (37.1 ?C) (Oral)   Resp 18   Ht 5\' 2"  (1.575 m)   Wt 68 kg   SpO2 99%   BMI 27.44 kg/m?  ? ?Examination: ? ? ? ?General: Appearance:     ?Overweight female in no acute distress  ?   ?Lungs:  respirations unlabored  ?Heart:    Normal heart rate. Normal rhythm. No murmurs, rubs,  or gallops.  ?  ?MS:   All extremities are intact.  ?  ?Neurologic:   Awake, alert, flat affect  ?  ?  ?  ? ? ?Data Reviewed: I have personally reviewed following labs and imaging studies ? ?CBC ?Lab Results  ?Component Value Date  ? WBC 12.8 (H) 01/25/2022  ? RBC 3.43 (L) 01/25/2022  ? HGB 10.3 (L) 01/25/2022  ? HCT 32.0 (L) 01/25/2022  ? MCV 93.3 01/25/2022  ? MCH 30.0 01/25/2022  ? PLT 383 01/25/2022  ? MCHC 32.2 01/25/2022  ? RDW 15.2 01/25/2022  ? LYMPHSABS 1.3 01/15/2022  ? MONOABS 0.3 01/15/2022  ? EOSABS 0.1 01/15/2022  ? BASOSABS 0.0 01/15/2022  ? ? ? ?Last metabolic panel ?Lab Results  ?Component Value Date  ? NA 134 (L) 01/25/2022  ? K 4.5 01/25/2022  ? CL 108 01/25/2022  ? CO2 22 01/25/2022  ? BUN 24 (H) 01/25/2022  ? CREATININE 0.81 01/25/2022  ? GLUCOSE 92 01/25/2022  ? GFRNONAA >60 01/25/2022  ? GFRAA >60 10/10/2019  ? CALCIUM 7.3 (L) 01/25/2022  ? PHOS 2.3 (L) 01/21/2022  ? PROT 3.7 (L) 01/22/2022  ? ALBUMIN <1.5 (L) 01/22/2022  ? BILITOT 0.5 01/22/2022  ? ALKPHOS 202 (H) 01/22/2022  ? AST 67 (H) 01/22/2022  ? ALT 62 (H) 01/22/2022  ? ANIONGAP 4 (L) 01/25/2022  ? ? ?GFR: ?Estimated Creatinine Clearance: 43.4 mL/min (by C-G formula based on SCr of 0.81 mg/dL). ? ?Recent Results (from the past 240 hour(s))  ?Culture, blood (routine x 2)     Status: Abnormal  ? Collection Time: 01/15/22  7:38 PM  ? Specimen: BLOOD  ?Result Value Ref Range Status  ? Specimen Description BLOOD RIGHT ANTECUBITAL  Final  ? Special Requests   Final  ?  BOTTLES DRAWN AEROBIC AND ANAEROBIC Blood Culture adequate volume  ? Culture  Setup Time (A)  Final  ?  GRAM VARIABLE ROD ?ANAEROBIC BOTTLE ONLY ?CRITICAL RESULT CALLED TO, READ BACK BY AND VERIFIED WITH: PHARM D C.PIERCE ON [id number] AT [id number] BY E.PARRISH ?  ? Culture (A)  Final  ?  BACTEROIDES THETAIOTAOMICRON ?BETA LACTAMASE POSITIVE ?EGGERTHELLA LENTA ?Standardized susceptibility testing for this organism is not available. ?Performed at [facility], [place] [address], [place], [place] [address] ?  ? Report Status 01/20/2022 FINAL  Final  ?Blood Culture ID Panel (Reflexed)     Status: None  ? Collection Time: 01/15/22  7:38 PM  ?Result Value Ref Range Status  ? Enterococcus faecalis NOT DETECTED NOT DETECTED Final  ? Enterococcus Faecium NOT DETECTED NOT DETECTED Final  ? Listeria monocytogenes NOT DETECTED NOT DETECTED Final  ? Staphylococcus species NOT DETECTED NOT DETECTED Final  ? Staphylococcus aureus (BCID) NOT DETECTED NOT DETECTED Final  ? Staphylococcus epidermidis NOT DETECTED NOT DETECTED Final  ? Staphylococcus lugdunensis NOT DETECTED NOT DETECTED Final  ? Streptococcus species NOT DETECTED NOT DETECTED Final  ? Streptococcus agalactiae NOT DETECTED NOT DETECTED Final  ? Streptococcus pneumoniae NOT DETECTED NOT DETECTED Final  ? Streptococcus pyogenes NOT DETECTED NOT DETECTED Final  ? A.calcoaceticus-baumannii NOT DETECTED NOT DETECTED Final  ? Bacteroides fragilis NOT DETECTED NOT DETECTED Final  ? Enterobacterales NOT DETECTED NOT DETECTED Final  ? Enterobacter cloacae complex NOT DETECTED NOT DETECTED Final  ? Escherichia coli NOT DETECTED NOT DETECTED Final  ? Klebsiella aerogenes NOT DETECTED NOT DETECTED Final  ? Klebsiella  oxytoca NOT DETECTED NOT DETECTED Final  ? Klebsiella pneumoniae NOT DETECTED NOT DETECTED Final  ? Proteus species NOT DETECTED NOT DETECTED Final  ? Salmonella species NOT DETECTED NOT DETECTED Final  ? Serratia marcescens NOT DETECTED NOT DETECTED Final  ? Haemophilus influenzae NOT DETECTED NOT DETECTED Final  ? Neisseria meningitidis NOT DETECTED NOT DETECTED Final  ? Pseudomonas aeruginosa NOT DETECTED NOT DETECTED Final  ? Stenotrophomonas maltophilia NOT DETECTED NOT DETECTED Final  ? Candida albicans NOT DETECTED NOT DETECTED Final  ? Candida auris NOT DETECTED NOT DETECTED Final  ? Candida glabrata NOT DETECTED NOT DETECTED Final  ? Candida krusei NOT DETECTED NOT DETECTED Final  ? Candida parapsilosis NOT DETECTED NOT DETECTED  Final  ? Candida tropicalis NOT DETECTED NOT DETECTED Final  ? Cryptococcus neoformans/gattii NOT DETECTED NOT DETECTED Final  ?  Comment: Performed at [facility], [address], [place], [place] [address]

## 2022-01-25 NOTE — Plan of Care (Signed)
?  Problem: Education: ?Goal: Knowledge of General Education information will improve ?Description: Including pain rating scale, medication(s)/side effects and non-pharmacologic comfort measures ?Outcome: Progressing ?  ?Problem: Health Behavior/Discharge Planning: ?Goal: Ability to manage health-related needs will improve ?Outcome: Progressing ?  ?Problem: Clinical Measurements: ?Goal: Ability to maintain clinical measurements within normal limits will improve ?Outcome: Progressing ?Goal: Will remain free from infection ?Outcome: Progressing ?Goal: Diagnostic test results will improve ?Outcome: Progressing ?Goal: Respiratory complications will improve ?Outcome: Progressing ?Goal: Cardiovascular complication will be avoided ?Outcome: Progressing ?  ?Problem: Coping: ?Goal: Level of anxiety will decrease ?Outcome: Progressing ?  ?Problem: Elimination: ?Goal: Will not experience complications related to bowel motility ?Outcome: Progressing ?Goal: Will not experience complications related to urinary retention ?Outcome: Progressing ?  ?Problem: Skin Integrity: ?Goal: Risk for impaired skin integrity will decrease ?Outcome: Progressing ?  ?Problem: Education: ?Goal: Required Educational Video(s) ?Outcome: Progressing ?  ?

## 2022-01-25 NOTE — Consult Note (Addendum)
WOC Nurse wound follow up ?Surgical team following for assessment and plan of care.  Requested to apply Vac to full thickness post-op midline abd wound. 13X3X3cm.  Pt was medicated for pain prior to the procedure and tolerated with minimal amt discomfort.  Small amt pink drainage.  Applied one piece black foam to cont suction. WOC team will plan to change again on Wed if patient is still in the hospital at that time.  ? ?WOC Nurse ostomy follow up ?Stoma type/location: Stoma is red and viable, flush with skin level, 1 1/2 inches.  Located in close proximity to the Vac dressing and both will need to be changed each time. ?Peristomal assessment: intact ?Output: 50 cc brown semiformed stool ?Ostomy pouching: 1pc. convex pouch, Lason # [id number], and barrier ring, Lawson # [phone] ?Education provided: Pt did not watch the process or ask questions and did not interact.  She will need total assistance with ostomy care after discharge.  No family member present in the room. ?Enrolled patient in [facility] Secure Start Discharge program: No  ? ?Cammie Mcgee MSN, RN, CWOCN, [place], CNS ?[phone]  ?

## 2022-01-25 NOTE — Progress Notes (Signed)
? ? ?9 Days Post-Op  ?Subjective: ?CC: ?No acute complaints. Tolerating PO, denies nausea/vomiting. Colostomy functioning. WBC down to 12. Minimal drain output. ? ?Objective: ?Vital signs in last 24 hours: ?Temp:  [97.8 ?F (36.6 ?C)-99.1 ?F (37.3 ?C)] 98 ?F (36.7 ?C) (04/10 1941) ?Pulse Rate:  [65-73] 71 (04/10 0823) ?Resp:  [17-24] 17 (04/10 7408) ?BP: (138-172)/(51-87) 172/60 (04/10 1448) ?SpO2:  [95 %-100 %] 100 % (04/10 0823) ?Last BM Date : 01/22/22 ? ?Intake/Output from previous day: ?04/09 0701 - 04/10 0700 ?In: 928.2 [P.O.:420; I.V.:508.2] ?Out: 958 [Urine:950; Drains:8] ?Intake/Output this shift: ?No intake/output data recorded. ? ?PE: ?Gen:  Alert, NAD, pleasant ?Heart: RRR ?Lungs: normal work of breathing ?Abd: soft, nondistended, colostomy productive of stool. Midline incision open at skin, clean and dry. No obvious dehiscence. JP with scant serous drainage. ? ?Lab Results:  ?Recent Labs  ?  01/24/22 ?0157 01/25/22 ?0412  ?WBC 14.3* 12.8*  ?HGB 10.1* 10.3*  ?HCT 31.6* 32.0*  ?PLT 288 383  ? ?BMET ?Recent Labs  ?  01/24/22 ?0157 01/25/22 ?0412  ?NA 132* 134*  ?K 4.3 4.5  ?CL 107 108  ?CO2 20* 22  ?GLUCOSE 98 92  ?BUN 19 24*  ?CREATININE 0.83 0.81  ?CALCIUM 6.9* 7.3*  ? ?PT/INR ?No results for input(s): LABPROT, INR in the last 72 hours. ?CMP  ?   ?Component Value Date/Time  ? NA 134 (L) 01/25/2022 0412  ? K 4.5 01/25/2022 0412  ? CL 108 01/25/2022 0412  ? CO2 22 01/25/2022 0412  ? GLUCOSE 92 01/25/2022 0412  ? BUN 24 (H) 01/25/2022 0412  ? CREATININE 0.81 01/25/2022 0412  ? CALCIUM 7.3 (L) 01/25/2022 0412  ? PROT 3.7 (L) 01/22/2022 0143  ? ALBUMIN <1.5 (L) 01/22/2022 0143  ? AST 67 (H) 01/22/2022 0143  ? ALT 62 (H) 01/22/2022 0143  ? ALKPHOS 202 (H) 01/22/2022 0143  ? BILITOT 0.5 01/22/2022 0143  ? GFRNONAA >60 01/25/2022 0412  ? GFRAA >60 10/10/2019 0307  ? ?Lipase  ?   ?Component Value Date/Time  ? LIPASE 37 01/15/2022 1704  ? ? ?Studies/Results: ?No results found. ? ?Anti-infectives: ?Anti-infectives  (From admission, onward)  ? ? Start     Dose/Rate Route Frequency Ordered Stop  ? 01/19/22 1400  piperacillin-tazobactam (ZOSYN) IVPB 3.375 g       ? 3.375 g ?12.5 mL/hr over 240 Minutes Intravenous Every 8 hours 01/19/22 0842 01/23/22 0940  ? 01/17/22 1400  piperacillin-tazobactam (ZOSYN) IVPB 2.25 g  Status:  Discontinued       ? 2.25 g ?100 mL/hr over 30 Minutes Intravenous Every 8 hours 01/17/22 0725 01/19/22 0842  ? 01/16/22 1800  piperacillin-tazobactam (ZOSYN) IVPB 2.25 g  Status:  Discontinued       ? 2.25 g ?100 mL/hr over 30 Minutes Intravenous Every 6 hours 01/16/22 1420 01/17/22 0725  ? 01/16/22 1000  piperacillin-tazobactam (ZOSYN) IVPB 3.375 g  Status:  Discontinued       ? 3.375 g ?12.5 mL/hr over 240 Minutes Intravenous Every 12 hours 01/15/22 2129 01/16/22 1420  ? 01/15/22 1945  piperacillin-tazobactam (ZOSYN) IVPB 3.375 g       ? 3.375 g ?100 mL/hr over 30 Minutes Intravenous  Once 01/15/22 1939 01/15/22 2045  ? ?  ? ? ? ?Assessment/Plan ?POD 8, s/p  ex lap with Hartmann's procedure by Dr. Dwain Sarna 01/16/22 for stercoral ulcer perforation ?- Tolerating soft diet and having bowel function ?- Antibiotics completed. WBC now downtrending. ?- Damp to dry dressing BID to  midline wound, will request vac placement today. ?- WOC consult for new colostomy, appreciate their assistance ?- Remove JP drain today ?- PT/OT recommending SNF, TOC looking for placement  ?  ?FEN - Soft, ensure ?VTE - SCDs, Subq heparin ?ID - zosyn 3/31 - 4/8 ?  ?Bacteremia - comp abx ?Acute hypoxic respiratory failure - extubated and off O2 ?Septic shock - resolved ?AKI - improved, making urine ?Hypothyroidism - on synthroid ?Dementia ?Hx HF ?  ?Appreciate medicine's assistance ? ? LOS: 10 days  ? ? ?Fritzi Mandes, MD ?[facility] ?01/25/2022, 8:53 AM ?Please see Amion for pager number during day hours 7:00am-4:30pm ? ?

## 2022-01-25 NOTE — Progress Notes (Signed)
OT Cancellation Note ? ?Patient Details ?Name: Alexis Cortez ?MRN: [id number] ?DOB: October 03, 1931 ? ? ?Cancelled Treatment:    Reason Eval/Treat Not Completed: Patient declined, no reason specified (Patient stated not today, that she did not feel like moving.  Patient kept eyes closed during conversation. Will reattempt if schedule permits.) ?[name], OTA ?Acute Rehabilitation Services  ?Pager [phone] ?Office [phone] ? ?Afia Messenger Jeannett Senior ?01/25/2022, 11:57 AM ?

## 2022-01-26 DIAGNOSIS — K668 Other specified disorders of peritoneum: Secondary | ICD-10-CM | POA: Diagnosis not present

## 2022-01-26 LAB — CBC
HCT: 27.3 % — ABNORMAL LOW (ref 36.0–46.0)
Hemoglobin: 8.7 g/dL — ABNORMAL LOW (ref 12.0–15.0)
MCH: 29.9 pg (ref 26.0–34.0)
MCHC: 31.9 g/dL (ref 30.0–36.0)
MCV: 93.8 fL (ref 80.0–100.0)
Platelets: 412 10*3/uL — ABNORMAL HIGH (ref 150–400)
RBC: 2.91 MIL/uL — ABNORMAL LOW (ref 3.87–5.11)
RDW: 15.4 % (ref 11.5–15.5)
WBC: 11 10*3/uL — ABNORMAL HIGH (ref 4.0–10.5)
nRBC: 0 % (ref 0.0–0.2)

## 2022-01-26 LAB — GLUCOSE, CAPILLARY
Glucose-Capillary: 90 mg/dL (ref 70–99)
Glucose-Capillary: 90 mg/dL (ref 70–99)
Glucose-Capillary: 77 mg/dL (ref 70–99)

## 2022-01-26 NOTE — Progress Notes (Signed)
? ? ?10 Days Post-Op  ?Subjective: ?CC: ?Had small bloody BM per rectum yesterday. No further episodes charted and patient denies further episode. She denies nausea/vomiting and pain though she does have abdominal pain during exam. Colostomy functioning.  ? ?Objective: ?Vital signs in last 24 hours: ?Temp:  [98.3 ?F (36.8 ?C)-99 ?F (37.2 ?C)] 98.3 ?F (36.8 ?C) (04/11 1884) ?Pulse Rate:  [60-73] 62 (04/11 0731) ?Resp:  [18-20] 18 (04/11 0731) ?BP: (92-161)/(48-103) 149/48 (04/11 0731) ?SpO2:  [95 %-100 %] 96 % (04/11 0731) ?Last BM Date : 01/25/22 ? ?Intake/Output from previous day: ?04/10 0701 - 04/11 0700 ?In: 260 [P.O.:260] ?Out: 1475 [Urine:1400; Drains:25; Stool:50] ?Intake/Output this shift: ?No intake/output data recorded. ? ?PE: ?Gen:  Alert, NAD, pleasant ?Heart: RRR ?Lungs: normal work of breathing ?Abd: soft, nondistended, colostomy productive of brown stool - no blood noted. Midline incision with wound vac in place with good suction and SS drainage in cannister. JP drain site with bandage c/d/I ? ?Lab Results:  ?Recent Labs  ?  01/25/22 ?0412 01/25/22 ?1549 01/26/22 ?0709  ?WBC 12.8*  --  11.0*  ?HGB 10.3* 10.4* 8.7*  ?HCT 32.0* 32.8* 27.3*  ?PLT 383  --  412*  ? ? ?BMET ?Recent Labs  ?  01/24/22 ?0157 01/25/22 ?0412  ?NA 132* 134*  ?K 4.3 4.5  ?CL 107 108  ?CO2 20* 22  ?GLUCOSE 98 92  ?BUN 19 24*  ?CREATININE 0.83 0.81  ?CALCIUM 6.9* 7.3*  ? ? ?PT/INR ?No results for input(s): LABPROT, INR in the last 72 hours. ?CMP  ?   ?Component Value Date/Time  ? NA 134 (L) 01/25/2022 0412  ? K 4.5 01/25/2022 0412  ? CL 108 01/25/2022 0412  ? CO2 22 01/25/2022 0412  ? GLUCOSE 92 01/25/2022 0412  ? BUN 24 (H) 01/25/2022 0412  ? CREATININE 0.81 01/25/2022 0412  ? CALCIUM 7.3 (L) 01/25/2022 0412  ? PROT 3.7 (L) 01/22/2022 0143  ? ALBUMIN <1.5 (L) 01/22/2022 0143  ? AST 67 (H) 01/22/2022 0143  ? ALT 62 (H) 01/22/2022 0143  ? ALKPHOS 202 (H) 01/22/2022 0143  ? BILITOT 0.5 01/22/2022 0143  ? GFRNONAA >60 01/25/2022 0412   ? GFRAA >60 10/10/2019 0307  ? ?Lipase  ?   ?Component Value Date/Time  ? LIPASE 37 01/15/2022 1704  ? ? ?Studies/Results: ?No results found. ? ?Anti-infectives: ?Anti-infectives (From admission, onward)  ? ? Start     Dose/Rate Route Frequency Ordered Stop  ? 01/19/22 1400  piperacillin-tazobactam (ZOSYN) IVPB 3.375 g       ? 3.375 g ?12.5 mL/hr over 240 Minutes Intravenous Every 8 hours 01/19/22 0842 01/23/22 0940  ? 01/17/22 1400  piperacillin-tazobactam (ZOSYN) IVPB 2.25 g  Status:  Discontinued       ? 2.25 g ?100 mL/hr over 30 Minutes Intravenous Every 8 hours 01/17/22 0725 01/19/22 0842  ? 01/16/22 1800  piperacillin-tazobactam (ZOSYN) IVPB 2.25 g  Status:  Discontinued       ? 2.25 g ?100 mL/hr over 30 Minutes Intravenous Every 6 hours 01/16/22 1420 01/17/22 0725  ? 01/16/22 1000  piperacillin-tazobactam (ZOSYN) IVPB 3.375 g  Status:  Discontinued       ? 3.375 g ?12.5 mL/hr over 240 Minutes Intravenous Every 12 hours 01/15/22 2129 01/16/22 1420  ? 01/15/22 1945  piperacillin-tazobactam (ZOSYN) IVPB 3.375 g       ? 3.375 g ?100 mL/hr over 30 Minutes Intravenous  Once 01/15/22 1939 01/15/22 2045  ? ?  ? ? ? ?Assessment/Plan ?  POD 10, s/p  ex lap with Hartmann's procedure by Dr. Dwain Sarna 01/16/22 for stercoral ulcer perforation ?- Tolerating soft diet and having bowel function ?- Antibiotics completed. WBC now downtrending - 11.0 (12.8) ?- wound VAC placement 4/10 ?- Hgb down to 8.7 from 10.4 - had bloody BM per rectum 4/10. Continue to monitor Hgb ?- WOC consult for new colostomy, appreciate their assistance ?- PT/OT recommending SNF, TOC looking for placement  ?- she is improving surgically but with poor appetite and motivation discussed with hospitalist and agree with palliative consult  ?  ?FEN - Soft, ensure ?VTE - SCDs, Subq heparin ?ID - zosyn 3/31 - 4/8 ?  ?Bacteremia - comp abx ?Acute hypoxic respiratory failure - extubated and off O2 ?Septic shock - resolved ?AKI - improved, making  urine ?Hypothyroidism - on synthroid ?Dementia ?Hx HF ?  ?Appreciate medicine's assistance ? ? LOS: 11 days  ? ? ?Eric Form, MD ?[facility] ?01/26/2022, 8:51 AM ?Please see Amion for pager number during day hours 7:00am-4:30pm ? ?

## 2022-01-26 NOTE — Progress Notes (Signed)
Physical Therapy Treatment ?Patient Details ?Name: Alexis Cortez ?MRN: [id number] ?DOB: [birth year] ?Today's Date: 01/26/2022 ? ? ?History of Present Illness Pt adm 3/31 with abdominal pain. Pt found to have stercoral ulcer with perforation and underwent colectomy and colostomy on 4/1. Pt remained on vent until extubated on 4/3. PMH - HTN, macular degeneration, dementia. ? ?  ?PT Comments  ? ? Pt received in supine, awake with very flat affect, agreeable to bed mobility with encouragement. Once seated EOB, pt immediately requesting return to supine, able to tolerate seated balance with encouragement to midline posture ~60 seconds, then becoming agitated with request to lay back down. Pt needing totalA +2 for bed mobility and maxA for log roll to L side with bed rail use. Pt performed limited BLE supine exercises with multimodal cues. Pt continues to benefit from PT services to progress toward functional mobility goals. Plan to assess transfer OOB to chair next session, may need to utilize mechanical lift due to pt anxiety regarding seated/standing trials and premedication.   ?Recommendations for follow up therapy are one component of a multi-disciplinary discharge planning process, led by the attending physician.  Recommendations may be updated based on patient status, additional functional criteria and insurance authorization. ? ?Follow Up Recommendations ? Skilled nursing-short term rehab (<3 hours/day) ?  ?  ?Assistance Recommended at Discharge Frequent or constant Supervision/Assistance  ?Patient can return home with the following Two people to help with walking and/or transfers;Assist for transportation ?  ?Equipment Recommendations ? None recommended by PT  ?  ?Recommendations for Other Services   ? ? ?  ?Precautions / Restrictions Precautions ?Precautions: Fall;Other (comment) ?Precaution Comments: colostomy, wound vac to midline incision, abdominal discomfort ?Restrictions ?Weight Bearing Restrictions: No  ?   ? ?Mobility ? Bed Mobility ?Overal bed mobility: Needs Assistance ?Bed Mobility: Rolling, Sidelying to Sit, Sit to Sidelying ?Rolling: Max assist, +2 for safety/equipment ?Sidelying to sit: +2 for physical assistance, HOB elevated, Total assist ?  ?  ?Sit to sidelying: +2 for physical assistance, Total assist ?General bed mobility comments: +2 totalA for posterior supine scooting with HOB flat, and for log roll to/from EOB; pt only tolerates sitting ~1 minute prior to becoming agitated with request to return to supine; not agreeable to OOB once seated upright. ?  ? ?Transfers ?  ?  ?General transfer comment: Pt defers c/o severe abd pain ?  ? ? ? ?  ?Balance Overall balance assessment: Needs assistance ?Sitting-balance support: No upper extremity supported, Feet supported ?Sitting balance-Leahy Scale: Poor ?Sitting balance - Comments: pt with L bias/lean due to wanting to return to supine ?  ?  ?  ?Standing balance comment: pt defers due to pain ?  ?  ?  ?  ?  ?  ? ?  ?Cognition Arousal/Alertness: Awake/alert ?Behavior During Therapy: Flat affect, Anxious ?Overall Cognitive Status: No family/caregiver present to determine baseline cognitive functioning ?Area of Impairment: Orientation, Attention, Memory, Following commands, Safety/judgement, Problem solving, Awareness ?  ?  ?  ?  ?  ?  ?  ?  ?Orientation Level: Disoriented to, Time ?Current Attention Level: Focused ?Memory: Decreased short-term memory ?Following Commands: Follows one step commands with increased time, Follows multi-step commands inconsistently ?  ?Awareness: Intellectual ?Problem Solving: Slow processing, Requires verbal cues, Difficulty sequencing, Requires tactile cues, Decreased initiation ?General Comments: Pt very flat demeanor, pt immediately requesting to lay back down once seated EOB, when prompted if she was afraid she states "yes" and "sharp" to "type of pain",  pt with increased urgency of request to return supine after sitting up ~1  minute, defers sitting up longer despite encouragement; anxiety/pain limiting progression. ?  ?  ? ?  ?Exercises Other Exercises ?Other Exercises: supine BLE A/AAROM: ankle pumps, hip abduction (tactile cues due to cognition), heel slides x10 reps ea ? ?  ?General Comments   ?  ?  ? ?Pertinent Vitals/Pain Pain Assessment ?Pain Assessment: Faces ?Faces Pain Scale: Hurts whole lot ?Pain Location: abdomen with mobility, pt grimacing/guarding throughout, reporting sharp pain ?Pain Descriptors / Indicators: Discomfort, Operative site guarding, Grimacing, Sharp ?Pain Intervention(s): Limited activity within patient's tolerance, Monitored during session, Repositioned, Patient requesting pain meds-RN notified  ? ? ? ?PT Goals (current goals can now be found in the care plan section) Acute Rehab PT Goals ?Patient Stated Goal: less pain ?PT Goal Formulation: With patient ?Time For Goal Achievement: 02/02/22 ?Progress towards PT goals: Progressing toward goals ? ?  ?Frequency ? ? ? Min 2X/week ? ? ? ?  ?PT Plan Current plan remains appropriate  ? ? ?   ?AM-PAC PT "6 Clicks" Mobility   ?Outcome Measure ? Help needed turning from your back to your side while in a flat bed without using bedrails?: A Lot ?Help needed moving from lying on your back to sitting on the side of a flat bed without using bedrails?: Total ?Help needed moving to and from a bed to a chair (including a wheelchair)?: Total ?Help needed standing up from a chair using your arms (e.g., wheelchair or bedside chair)?: Total ?Help needed to walk in hospital room?: Total ?Help needed climbing 3-5 steps with a railing? : Total ?6 Click Score: 7 ? ?  ?End of Session   ?Activity Tolerance: Patient limited by pain;Patient limited by fatigue ?Patient left: in bed;with call bell/phone within reach;with bed alarm set;with family/visitor present;Other (comment) (bed in chair posture HOB >40 deg) ?Nurse Communication: Mobility status;Patient requests pain meds;Other  (comment) (ready for tylenol) ?PT Visit Diagnosis: Other abnormalities of gait and mobility (R26.89);Muscle weakness (generalized) (M62.81) ?  ? ? ?Time: 2947-6546 ?PT Time Calculation (min) (ACUTE ONLY): 20 min ? ?Charges:  $Therapeutic Activity: 8-22 mins          ?          ? ?Kimbrely Buckel P., PTA ?Acute Rehabilitation Services ?Secure Chat Preferred 9a-5:30pm ?Office: [phone]  ? ? ?Sheriann Newmann M Erryn Dickison ?01/26/2022, 5:52 PM ? ?

## 2022-01-26 NOTE — Progress Notes (Signed)
Notified by RN that patient with more blood per rectum when moved to bed pan for patient to urinate. Per RN blood clots noted which were dark and old appearing. I evaluated patient. She states she has been diagnosed with hemorrhoids before and never had treatment ?NT in room as chaperone for exam. GU exam shows prolapsed non thrombosed hemorrhoids. No active bleeding. Mild tenderness with exam. No palpable internal hemorrhoids or fissures. Will continue to monitor ?

## 2022-01-26 NOTE — Progress Notes (Signed)
Per MD request asked patient to void in bedpan for urine specimen. Pt dry and placed on bedpan. Pt stated she had finished. Pt had dark colored blood clots and dark blood with urine in bedpan. Emptied approximately 200 ml combined. Dr. Benjamine Mola and Dr. Genevive Bi made aware.  ?This was not new from shift report Igiven and per NT who had pt previous day. Pt resting with call bell within reach.  Will continue to monitor. ?Thomas Hoff, RN ? ?

## 2022-01-26 NOTE — Progress Notes (Signed)
? ?PROGRESS NOTE ? ? ? ?Alexis Cortez  [id number] DOB: Apr 02, 1931 DOA: 01/15/2022 ?PCP: Jettie Booze, NP ? ? ?Brief Narrative: ?88 year old female with history of HTN, anxiety, cognitive impairment and ambulatory dysfunction with rolling walker dependence presenting with abdominal pain, constipation, poor p.o. intake and admitted by general surgery on 01/15/2022 with working diagnosis of pneumoperitoneum as noted on CT abdomen and pelvis.  She was started on IV Zosyn.  She underwent sigmoid colectomy with end colostomy on 4/1 for perforated sigmoid colon with peritonitis. Hospitalist consulted for medical care.  Not progressing well.  Discussed with son who is open to palliative care consult. ? ? ? ? ?Assessment and Plan: ?* Pneumoperitoneum/perforated sigmoid colon ?S/p sigmoid colectomy and end colostomy by Donne Hazel.  ?-Surgical wound care, JP drain and colostomy care per general surgery. ?-Encourage incentive spirometry/mobility ?-tylenol scheduled ?-WBC up slightly-- will trend- no fever but on schedule tylenol ? ?Goals of care, counseling/discussion ?Per Dr. Cyndia Skeeters: Extensive discussion about CODE STATUS and pros and cons of CPR and intubation with patient's son at bedside.  Patient has some cognitive impairment but oriented to self, person and place.  Although she does not have a good recollection of what happened, she seemed to comprehend the conversation.  After risk and benefit discussion, she does not want CPR or further intubation.  Patient's son is in agreement. ?-since patient is not progressing,plan to get palliative care consult for [place] ? ?Bloody stool x 1 ?-heparin on hold ?-no further bleeding ?-? From staple in rectum ?-CBC in AM ? ?Acute respiratory failure ([place]) ?Patient required mechanical ventilation and ICU stay postoperatively.  Extubated on 4/3.  Currently on room air. ?-Encourage incentive spirometry ? ?AKI (acute kidney injury) ([place]) ?-Cr.  0.96 in 05/2020 ?-Avoid nephrotoxic  meds. ? ? ?Bacteremia ?Blood culture on 3/31 with bacteroids thetaiotaomicron.  Likely GI translocation. ?-treated Zosyn ? ?Abnormal echocardiogram ?Limited TTE on 4/1 with probably mild to moderately decreased LVEF and dilated RV with what appears to be severely reduced RVSF but very limited window.  Overall, she appears euvolemic except for RLE swelling from infiltrated line.  Currently on room air.  No respiratory distress. ?-Monitor intake and output ?-Repeated Transthoracic Echocardiogram w/o issues ? ?Hypocalcemia ?Corrects to normal for hypoalbuminemia. ? ?Thrombocytopenia ([place]) ?Likely secondary to acute illness. Now resolved. ? ?Hypoalbuminemia ?-Encourage PO intake, advance diet ?-Dietitian consult ? ?Normocytic anemia ?Likely due to acute illness and some surgical blood loss. Stable. No evidence of bleeding. Associated thrombocytopenia has resolved. ? ? ?Hyponatremia ?Mild and stable. Asymptomatic. ? ?Cognitive impairment ?Oriented to self, person and place but not time.  Dependent for most ADL's. ?-Reorientation and delirium precautions. ?-resume home medications ? ?Essential hypertension ?Initially normotensive off home amlodipine and lisinopril. Now with increasing blood pressure ?-Continue holding both ?-Hydralazine PO for now; restart home medication if blood pressure continues to be elevated and/or patient is requiring hydralazine PRN for blood pressure control ? ?Septic shock ([place]) ?Likely due to perforated bowel and bacteremia.  Resolved. ? ?Hypoglycemia ?-IVF until taking in POs better ?-asked nursing to document percentage eaten ? ? ? ?DVT prophylaxis: heparin held due to episode of bloody stool ?Code Status:   Code Status: DNR ?Family Communication: Son on phone ?Disposition Plan: Per primary ? ? ? ? ?Subjective: ?No further bleeding ? ?Objective: ?BP (!) 148/53 (BP Location: Left Leg)   Pulse 81   Temp 98.1 ?F (36.7 ?C) (Oral)   Resp 20   Ht 5\' 2"  (1.575 m)   Wt  68 kg   SpO2 98%   BMI  27.44 kg/m?  ? ?Examination: ? ? ? ?General: Appearance:     ?Overweight female in no acute distress  ?   ?Lungs:      respirations unlabored  ?Heart:    Normal heart rate.  ?  ?MS:   All extremities are intact.  ?  ?Neurologic:   Awake, alert  ?  ?  ? ? ?Data Reviewed: I have personally reviewed following labs and imaging studies ? ?CBC ?Lab Results  ?Component Value Date  ? WBC 11.0 (H) 01/26/2022  ? RBC 2.91 (L) 01/26/2022  ? HGB 8.7 (L) 01/26/2022  ? HCT 27.3 (L) 01/26/2022  ? MCV 93.8 01/26/2022  ? MCH 29.9 01/26/2022  ? PLT 412 (H) 01/26/2022  ? MCHC 31.9 01/26/2022  ? RDW 15.4 01/26/2022  ? LYMPHSABS 1.3 01/15/2022  ? MONOABS 0.3 01/15/2022  ? EOSABS 0.1 01/15/2022  ? BASOSABS 0.0 01/15/2022  ? ? ? ?Last metabolic panel ?Lab Results  ?Component Value Date  ? NA 134 (L) 01/25/2022  ? K 4.5 01/25/2022  ? CL 108 01/25/2022  ? CO2 22 01/25/2022  ? BUN 24 (H) 01/25/2022  ? CREATININE 0.81 01/25/2022  ? GLUCOSE 92 01/25/2022  ? GFRNONAA >60 01/25/2022  ? GFRAA >60 10/10/2019  ? CALCIUM 7.3 (L) 01/25/2022  ? PHOS 2.3 (L) 01/21/2022  ? PROT 3.7 (L) 01/22/2022  ? ALBUMIN <1.5 (L) 01/22/2022  ? BILITOT 0.5 01/22/2022  ? ALKPHOS 202 (H) 01/22/2022  ? AST 67 (H) 01/22/2022  ? ALT 62 (H) 01/22/2022  ? ANIONGAP 4 (L) 01/25/2022  ? ? ?GFR: ?Estimated Creatinine Clearance: 43.4 mL/min (by C-G formula based on SCr of 0.81 mg/dL). ? ?No results found for this or any previous visit (from the past 240 hour(s)). ?  ? ? ?Radiology Studies: ?No results found. ? ? ? LOS: 11 days  ? ? ?Eulogio Bear DO ?[facility] ?01/26/2022, 12:17 PM ? ? ?If 7PM-7AM, please contact night-coverage ?[email or web address] ? ?

## 2022-01-27 LAB — BASIC METABOLIC PANEL
Anion gap: 4 — ABNORMAL LOW (ref 5–15)
BUN: 22 mg/dL (ref 8–23)
CO2: 22 mmol/L (ref 22–32)
Calcium: 7.3 mg/dL — ABNORMAL LOW (ref 8.9–10.3)
Chloride: 112 mmol/L — ABNORMAL HIGH (ref 98–111)
Creatinine, Ser: 0.61 mg/dL (ref 0.44–1.00)
GFR, Estimated: >60 mL/min (ref >60)
Glucose, Bld: 88 mg/dL (ref 70–99)
Potassium: 4.3 mmol/L (ref 3.5–5.1)
Sodium: 138 mmol/L (ref 135–145)

## 2022-01-27 LAB — GLUCOSE, CAPILLARY
Glucose-Capillary: 79 mg/dL (ref 70–99)
Glucose-Capillary: <10 mg/dL — CL (ref 70–99)

## 2022-01-27 LAB — CBC
HCT: 23.9 % — ABNORMAL LOW (ref 36.0–46.0)
Hemoglobin: 7.7 g/dL — ABNORMAL LOW (ref 12.0–15.0)
MCH: 30.6 pg (ref 26.0–34.0)
MCHC: 32.2 g/dL (ref 30.0–36.0)
MCV: 94.8 fL (ref 80.0–100.0)
Platelets: 376 10*3/uL (ref 150–400)
RBC: 2.52 MIL/uL — ABNORMAL LOW (ref 3.87–5.11)
RDW: 15.7 % — ABNORMAL HIGH (ref 11.5–15.5)
WBC: 9.9 10*3/uL (ref 4.0–10.5)
nRBC: 0 % (ref 0.0–0.2)

## 2022-01-27 LAB — VITAMIN B12: Vitamin B-12: 2038 pg/mL — ABNORMAL HIGH (ref 180–914)

## 2022-01-27 NOTE — Consult Note (Signed)
? Alexis Cortez  KKX:[id number] DOB: 1930-10-28 DOA: 01/15/2022 ?PCP: Jettie Booze, NP   ? ?Brief Narrative:  ?88 year old with a history of HTN, anxiety, cognitive impairment, and ambulatory dysfunction dependent upon a walker who presented with abdominal pain and was found to have pneumoperitoneum.  She was admitted to the general surgery service 01/15/2022 and ultimately underwent sigmoid colectomy with end colostomy/1/23 for a perforated sigmoid colon with peritonitis.  [facility] has been consulted for assistance in medical care.  Her progress has been slow. ? ?Code Status: NO CODE BLUE ? ?DVT prophylaxis: ?SCDs ? ?Interim Hx: ?Afebrile.  Vital signs stable.  Mild sinus bradycardia noted with heart rate 50-65. ? ?Assessment & Plan: ? ?Pneumoperitoneum/perforated sigmoid colon ?S/p sigmoid colectomy and end colostomy by Donne Hazel.  ?-Surgical wound care, JP drain and colostomy care per general surgery. ? ?Goals of care, counseling/discussion ?Per Dr. Cyndia Skeeters: Extensive discussion about CODE STATUS and pros and cons of CPR and intubation with patient's son at bedside.  Patient has some cognitive impairment but oriented to self, person and place.  Although she does not have a good recollection of what happened, she seemed to comprehend the conversation.  After risk and benefit discussion, she does not want CPR or further intubation.  Patient's son is in agreement. ? ?Acute respiratory failure  ?Patient required mechanical ventilation and ICU stay postoperatively.  Extubated on 4/3.  Currently on room air. ? ?Acute kidney injury ?Resolved ? ?Bacteremia ?Blood culture on 3/31 with bacteroids thetaiotaomicron.  Likely GI translocation. ?-was tx w/ IV Zosyn ? ?Abnormal echocardiogram ?Limited TTE on 4/1 with probably mild to moderately decreased LVEF and dilated RV with what appears to be severely reduced RVSF but very limited window.  ? ?Hypocalcemia ?Corrects to normal for hypoalbuminemia. ? ?Thrombocytopenia  ?Likely  secondary to acute illness -resolved ? ?Hypoalbuminemia ?-Encourage PO intake ?-Dietitian consult ? ?Normocytic anemia ?Likely due to acute illness and some surgical blood loss. Stable. No evidence of bleeding.  ? ?Hyponatremia ?Resolved ? ?Cognitive impairment ?Oriented to self, person and place but not time.  Dependent for most ADL's. ?-Reorientation and delirium precautions. ? ?Essential hypertension ?Blood pressure reasonably controlled at this time ? ?Septic shock ?Likely due to perforated bowel and bacteremia.  Resolved. ? ?Objective: ?Blood pressure (!) 149/52, pulse 65, temperature 98.8 ?F (37.1 ?C), temperature source Oral, resp. rate 20, height $RemoveBe'5\' 2"'[id number]$  (1.575 m), weight 68 kg, SpO2 98 %. ? ?Intake/Output Summary (Last 24 hours) at 01/27/2022 1115 ?Last data filed at 01/27/2022 1048 ?Gross per 24 hour  ?Intake 350 ml  ?Output 1485 ml  ?Net -1135 ml  ? ?Filed Weights  ? 01/15/22 1617  ?Weight: 68 kg  ? ? ?Examination: ?No exam by [facility] today.  We will continue to follow with you. ? ?CBC: ?Recent Labs  ?Lab 01/25/22 ?0412 01/25/22 ?1549 01/26/22 ?0709 01/27/22 ?0130  ?WBC 12.8*  --  11.0* 9.9  ?HGB 10.3* 10.4* 8.7* 7.7*  ?HCT 32.0* 32.8* 27.3* 23.9*  ?MCV 93.3  --  93.8 94.8  ?PLT 383  --  412* 376  ? ?Basic Metabolic Panel: ?Recent Labs  ?Lab 01/21/22 ?0158 01/22/22 ?0143 01/24/22 ?0157 01/25/22 ?1696 01/27/22 ?7893  ?NA 133*   < > 132* 134* 138  ?K 4.1   < > 4.3 4.5 4.3  ?CL 105   < > 107 108 112*  ?CO2 21*   < > 20* 22 22  ?GLUCOSE 71   < > 98 92 88  ?BUN 15   < >  19 24* 22  ?CREATININE 1.08*   < > 0.83 0.81 0.61  ?CALCIUM 7.3*   < > 6.9* 7.3* 7.3*  ?MG 1.8  --   --   --   --   ?PHOS 2.3*  --   --   --   --   ? < > = values in this interval not displayed.  ? ?GFR: ?Estimated Creatinine Clearance: 44 mL/min (by C-G formula based on SCr of 0.61 mg/dL). ? ?Liver Function Tests: ?Recent Labs  ?Lab 01/21/22 ?0158 01/22/22 ?0143  ?AST 153* 67*  ?ALT 94* 62*  ?ALKPHOS 218* 202*  ?BILITOT 1.0 0.5  ?PROT 3.9* 3.7*   ?ALBUMIN <1.5* <1.5*  ? ? ?CBG: ?Recent Labs  ?Lab 01/25/22 ?1201 01/25/22 ?2328 01/26/22 ?8295 01/26/22 ?1655 01/27/22 ?6213  ?GLUCAP 92 90 90 77 79  ? ? ?Scheduled Meds: ? (feeding supplement) PROSource Plus  30 mL Oral TID BM  ? acetaminophen  1,000 mg Oral Q6H  ? busPIRone  7.5 mg Oral BID  ? chlorhexidine gluconate (MEDLINE KIT)  15 mL Mouth Rinse BID  ? Chlorhexidine Gluconate Cloth  6 each Topical Q0600  ? feeding supplement  237 mL Oral TID BM  ? levothyroxine  75 mcg Oral Q0600  ? mouth rinse  15 mL Mouth Rinse QID  ? melatonin  3 mg Oral QHS  ? mirtazapine  15 mg Oral QHS  ? multivitamin with minerals  1 tablet Oral Daily  ? OLANZapine  2.5 mg Oral QHS  ? ?Continuous Infusions: ? sodium chloride 10 mL/hr at 01/19/22 1500  ? sodium chloride    ? dextrose 5% lactated ringers 50 mL/hr at 01/27/22 1048  ? ? ? LOS: 12 days  ? ?Cherene Altes, MD ?[facility] ?Office  [phone] ?Pager - Text Page per Shea Evans ? ?If 7PM-7AM, please contact night-coverage per Amion ?01/27/2022, 11:15 AM ? ? ? ? ?

## 2022-01-27 NOTE — Progress Notes (Addendum)
? ? ?11 Days Post-Op  ?Subjective: ?Discussed with RN and report from overnight she has had continued bloody output per rectum. Patient is drowsy this morning and states she has abdominal pain intermittently - worse with palpation/movement. She denies nausea or emesis. She states she is not eating much because she doesn't have an appetite  ? ?Objective: ?Vital signs in last 24 hours: ?Temp:  [97.6 ?F (36.4 ?C)-98.7 ?F (37.1 ?C)] 97.6 ?F (36.4 ?C) (04/12 0350) ?Pulse Rate:  [61-81] 63 (04/12 0350) ?Resp:  [15-20] 20 (04/12 0350) ?BP: (137-152)/(53-65) 149/65 (04/12 0350) ?SpO2:  [94 %-100 %] 100 % (04/12 0350) ?Last BM Date : 01/25/22 ? ?Intake/Output from previous day: ?04/11 0701 - 04/12 0700 ?In: 180 [P.O.:180] ?Out: [id number] [Urine:1250; Drains:55; Stool:150] ?Intake/Output this shift: ?Total I/O ?In: -  ?Out: 30 [Stool:30] ? ?PE: ?Gen:  Alert, NAD, pleasant ?Heart: RRR ?Lungs: normal work of breathing ?Abd: soft, nondistended, colostomy productive of brown stool - no blood noted. Midline incision with wound vac in place with good suction and this was removed for change. Wound with false bottom and and brown drainage consistent with superficial dehiscence but fascia appears intact at this time. Stoma pink ?GU: digital rectal exam this am with bright red blood per rectum ? ? ? ? ?Lab Results:  ?Recent Labs  ?  01/26/22 ?0709 01/27/22 ?0130  ?WBC 11.0* 9.9  ?HGB 8.7* 7.7*  ?HCT 27.3* 23.9*  ?PLT 412* 376  ? ? ?BMET ?Recent Labs  ?  01/25/22 ?0412  ?NA 134*  ?K 4.5  ?CL 108  ?CO2 22  ?GLUCOSE 92  ?BUN 24*  ?CREATININE 0.81  ?CALCIUM 7.3*  ? ? ?PT/INR ?No results for input(s): LABPROT, INR in the last 72 hours. ?CMP  ?   ?Component Value Date/Time  ? NA 134 (L) 01/25/2022 0412  ? K 4.5 01/25/2022 0412  ? CL 108 01/25/2022 0412  ? CO2 22 01/25/2022 0412  ? GLUCOSE 92 01/25/2022 0412  ? BUN 24 (H) 01/25/2022 0412  ? CREATININE 0.81 01/25/2022 0412  ? CALCIUM 7.3 (L) 01/25/2022 0412  ? PROT 3.7 (L) 01/22/2022 0143  ?  ALBUMIN <1.5 (L) 01/22/2022 0143  ? AST 67 (H) 01/22/2022 0143  ? ALT 62 (H) 01/22/2022 0143  ? ALKPHOS 202 (H) 01/22/2022 0143  ? BILITOT 0.5 01/22/2022 0143  ? GFRNONAA >60 01/25/2022 0412  ? GFRAA >60 10/10/2019 0307  ? ?Lipase  ?   ?Component Value Date/Time  ? LIPASE 37 01/15/2022 1704  ? ? ?Studies/Results: ?No results found. ? ?Anti-infectives: ?Anti-infectives (From admission, onward)  ? ? Start     Dose/Rate Route Frequency Ordered Stop  ? 01/19/22 1400  piperacillin-tazobactam (ZOSYN) IVPB 3.375 g       ? 3.375 g ?12.5 mL/hr over 240 Minutes Intravenous Every 8 hours 01/19/22 0842 01/23/22 0940  ? 01/17/22 1400  piperacillin-tazobactam (ZOSYN) IVPB 2.25 g  Status:  Discontinued       ? 2.25 g ?100 mL/hr over 30 Minutes Intravenous Every 8 hours 01/17/22 0725 01/19/22 0842  ? 01/16/22 1800  piperacillin-tazobactam (ZOSYN) IVPB 2.25 g  Status:  Discontinued       ? 2.25 g ?100 mL/hr over 30 Minutes Intravenous Every 6 hours 01/16/22 1420 01/17/22 0725  ? 01/16/22 1000  piperacillin-tazobactam (ZOSYN) IVPB 3.375 g  Status:  Discontinued       ? 3.375 g ?12.5 mL/hr over 240 Minutes Intravenous Every 12 hours 01/15/22 2129 01/16/22 1420  ? 01/15/22 1945  piperacillin-tazobactam (ZOSYN)  IVPB 3.375 g       ? 3.375 g ?100 mL/hr over 30 Minutes Intravenous  Once 01/15/22 1939 01/15/22 2045  ? ?  ? ? ? ?Assessment/Plan ?POD 11, s/p  ex lap with Hartmann's procedure by Dr. Donne Hazel 01/16/22 for stercoral ulcer perforation ?- Tolerating soft diet and having bowel function ?- Antibiotics completed. WBC normalized 9.9 (11.0) ?- wound VAC placement 4/10. Wound now with dehiscence and at risk for evisceration. Discontinue wound vac and switch to wet to dry dressing changes with mepitel dressing at base of wound ?- Hgb down to 7.7 from 8.7 and bleeding per rectum. Possible bleeding from staple line of rectal stump. ?- WOC consult for new colostomy, appreciate their assistance ?- PT/OT recommending SNF, TOC looking for  placement  ?- given worsening anemia/bleeding, low appetite/motivation, and now with wound dehiscence think palliative consult would be very helpful to help delineate goals of care. GI consult for limited flex sig to evaluate rectal stump would be next step if patient and family would like to continue aggressive treatment. Also should patient eviscerate she would require further surgical intervention in OR for closure with ongoing risk for wound healing complications ?  ?FEN - Soft, ensure ?VTE - SCDs, Subq heparin  ?ID - zosyn 3/31 - 4/8 ?  ?Bacteremia - comp abx ?Acute hypoxic respiratory failure - extubated and off O2 ?Septic shock - resolved ?AKI - improved, making urine ?Hypothyroidism - on synthroid ?Dementia ?Hx HF ?  ?Appreciate medicine's assistance ? ? LOS: 12 days  ? ? ?Winferd Humphrey, PA-C ?[facility] ?01/27/2022, 9:10 AM ?Please see Amion for pager number during day hours 7:00am-4:30pm ? ?

## 2022-01-27 NOTE — Consult Note (Signed)
WOC Nurse wound follow up ?Removed Vac dressing from midline full thickness post-op wound and surgical team present to assess wound; refer to their progress notes.  Wound had a false bottom and was opened with a swab, mod amt tan drainage and tan wound bed.  Vac discontinued and topical treatment orders provided for bedside nurses to perform as follows: Apply moist gauze to abd wound BID, then cover with ABD pad. Keep Mepitel in the wound bed. Change this Q Wed. (Another piece left at bedside or can order [facility] [place] # [phone]) ? ?WOC Nurse ostomy follow up ?Stoma type/location: Stoma is red and viable, flush with skin level, 1 1/2 inches. ?Peristomal assessment: intact ?Output: small amt bloody drainage ?Ostomy pouching: 1pc. convex pouch, [place] # [id number], and barrier ring, Lawson # [phone] ?Education provided: Pt did not watch the process or ask questions and did not interact.  She will need total assistance with ostomy care after discharge.  No family member present in the room. ?Supplies at bedside for staff nurses use. ?Enrolled patient in [place] Secure Start Discharge program: No   ?Cammie Mcgee MSN, RN, CWOCN, CWCN-AP, CNS ? ?

## 2022-01-27 NOTE — Progress Notes (Signed)
Occupational Therapy Treatment ?Patient Details ?Name: Alexis Cortez ?MRN: [id number] ?DOB: 10/31/1930 ?Today's Date: 01/27/2022 ? ? ?History of present illness Pt adm 3/31 with abdominal pain. Pt found to have stercoral ulcer with perforation and underwent colectomy and colostomy on 4/1. Pt remained on vent until extubated on 4/3. PMH - HTN, macular degeneration, dementia. ?  ?OT comments ? Patient received in supine and declined getting to EOB. Patient performed grooming with washing face and hands and participated in BUE strengthening/ROM exercises at bed level.  Acute OT to continue to follow to increase mobility and participation   ? ?Recommendations for follow up therapy are one component of a multi-disciplinary discharge planning process, led by the attending physician.  Recommendations may be updated based on patient status, additional functional criteria and insurance authorization. ?   ?Follow Up Recommendations ? Skilled nursing-short term rehab (<3 hours/day)  ?  ?Assistance Recommended at Discharge Frequent or constant Supervision/Assistance  ?Patient can return home with the following ? A lot of help with walking and/or transfers;A lot of help with bathing/dressing/bathroom ?  ?Equipment Recommendations ? Other (comment) (TBD)  ?  ?Recommendations for Other Services   ? ?  ?Precautions / Restrictions Precautions ?Precautions: Fall;Other (comment) ?Precaution Comments: colostomy, wound vac to midline incision, abdominal discomfort ?Restrictions ?Weight Bearing Restrictions: No  ? ? ?  ? ?Mobility Bed Mobility ?  ?  ?  ?  ?  ?  ?  ?General bed mobility comments: declined mobilty ?  ? ?Transfers ?  ?  ?  ?  ?  ?  ?  ?  ?  ?  ?  ?  ?Balance   ?  ?  ?  ?  ?  ?  ?  ?  ?  ?  ?  ?  ?  ?  ?  ?  ?  ?  ?   ? ?ADL either performed or assessed with clinical judgement  ? ?ADL Overall ADL's : Needs assistance/impaired ?  ?  ?Grooming: Wash/dry hands;Wash/dry face;Set up;Bed level ?Grooming Details (indicate cue type  and reason): able to wash hands and face with setup ?  ?  ?  ?  ?  ?  ?  ?  ?  ?  ?  ?  ?  ?  ?  ?General ADL Comments: declined sitting on EOB ?  ? ?Extremity/Trunk Assessment Upper Extremity Assessment ?RUE Deficits / Details: Edema noted from elbow down to the wrist. pt with pitting noted with recommendation of elevation ?RUE Coordination: decreased fine motor;decreased gross motor ?  ?  ?  ?  ?  ? ?Vision   ?  ?  ?Perception   ?  ?Praxis   ?  ? ?Cognition Arousal/Alertness: Awake/alert ?Behavior During Therapy: Flat affect ?Overall Cognitive Status: No family/caregiver present to determine baseline cognitive functioning ?Area of Impairment: Orientation, Attention, Memory, Following commands, Safety/judgement, Problem solving, Awareness ?  ?  ?  ?  ?  ?  ?  ?  ?Orientation Level: Disoriented to, Time ?Current Attention Level: Focused ?Memory: Decreased short-term memory ?Following Commands: Follows one step commands with increased time, Follows multi-step commands inconsistently ?  ?Awareness: Intellectual ?Problem Solving: Slow processing, Requires verbal cues, Difficulty sequencing, Requires tactile cues, Decreased initiation ?General Comments: unable to give date, aware of place ?  ?  ?   ?Exercises Exercises: General Upper Extremity ?General Exercises - Upper Extremity ?Shoulder Flexion: AAROM, Both, 10 reps ?Shoulder ABduction: AROM, Right, 10 reps, AAROM, Left ?Elbow  Flexion: Strengthening, Both, 15 reps, Theraband ?Theraband Level (Elbow Flexion): Level 1 (Yellow) ?Elbow Extension: Strengthening, Both, 10 reps, Supine, Theraband ?Theraband Level (Elbow Extension): Level 1 (Yellow) ? ?  ?Shoulder Instructions   ? ? ?  ?General Comments    ? ? ?Pertinent Vitals/ Pain       Pain Assessment ?Pain Assessment: Faces ?Faces Pain Scale: Hurts a little bit ?Pain Location: abdomen ?Pain Descriptors / Indicators: Discomfort, Grimacing ?Pain Intervention(s): Limited activity within patient's tolerance, Monitored  during session ? ?Home Living   ?  ?  ?  ?  ?  ?  ?  ?  ?  ?  ?  ?  ?  ?  ?  ?  ?  ?  ? ?  ?Prior Functioning/Environment    ?  ?  ?  ?   ? ?Frequency ? Min 2X/week  ? ? ? ? ?  ?Progress Toward Goals ? ?OT Goals(current goals can now be found in the care plan section) ? Progress towards OT goals: Not progressing toward goals - comment (difficult to progress due to poor participation) ? ?Acute Rehab OT Goals ?OT Goal Formulation: Patient unable to participate in goal setting ?Time For Goal Achievement: 02/02/22 ?Potential to Achieve Goals: Fair ?ADL Goals ?Pt Will Perform Grooming: with supervision;standing ?Pt Will Transfer to Toilet: with min guard assist;ambulating ?Pt/caregiver will Perform Home Exercise Program: Increased strength;Both right and left upper extremity;With theraband;With Supervision;With written HEP provided ?Additional ADL Goal #1: Pt to complete bed mobility with supervision in prep for ADLs  ?Plan Discharge plan remains appropriate   ? ?Co-evaluation ? ? ?   ?  ?  ?  ?  ? ?  ?AM-PAC OT "6 Clicks" Daily Activity     ?Outcome Measure ? ? Help from another person eating meals?: A Lot ?Help from another person taking care of personal grooming?: A Lot ?Help from another person toileting, which includes using toliet, bedpan, or urinal?: Total ?Help from another person bathing (including washing, rinsing, drying)?: A Lot ?Help from another person to put on and taking off regular upper body clothing?: A Lot ?Help from another person to put on and taking off regular lower body clothing?: A Lot ?6 Click Score: 11 ? ?  ?End of Session   ? ?OT Visit Diagnosis: Unsteadiness on feet (R26.81);Other abnormalities of gait and mobility (R26.89);Muscle weakness (generalized) (M62.81) ?  ?Activity Tolerance Patient limited by fatigue ?  ?Patient Left in bed;with call bell/phone within reach;with bed alarm set ?  ?Nurse Communication Mobility status ?  ? ?   ? ?Time: [id number] ?OT Time Calculation (min): 15  min ? ?Charges: OT General Charges ?$OT Visit: 1 Visit ?OT Treatments ?$Therapeutic Exercise: 8-22 mins ? ?Lodema Hong, OTA ?Acute Rehabilitation Services  ?Pager [phone] ?Office [phone] ? ? ?Candelaria Arenas ?01/27/2022, 12:13 PM ?

## 2022-01-27 NOTE — Progress Notes (Signed)
Palliative-  ? ?Thank you for this consult.  ? ?Plan made to meet patient's son and daughter tomorrow at 2pm in patient's room.  ? ?Ocie Bob, AGNP-C ?Palliative Medicine ? ?Please call Palliative Medicine team phone with any questions [phone]. ?For individual providers please see AMION. ? ?No charge ?

## 2022-01-28 DIAGNOSIS — Z515 Encounter for palliative care: Secondary | ICD-10-CM

## 2022-01-28 DIAGNOSIS — E8809 Other disorders of plasma-protein metabolism, not elsewhere classified: Secondary | ICD-10-CM | POA: Diagnosis not present

## 2022-01-28 DIAGNOSIS — K668 Other specified disorders of peritoneum: Secondary | ICD-10-CM | POA: Diagnosis not present

## 2022-01-28 DIAGNOSIS — Z7189 Other specified counseling: Secondary | ICD-10-CM | POA: Diagnosis not present

## 2022-01-28 DIAGNOSIS — A419 Sepsis, unspecified organism: Secondary | ICD-10-CM | POA: Diagnosis not present

## 2022-01-28 DIAGNOSIS — R627 Adult failure to thrive: Secondary | ICD-10-CM

## 2022-01-28 LAB — CBC
HCT: 26.8 % — ABNORMAL LOW (ref 36.0–46.0)
Hemoglobin: 8.7 g/dL — ABNORMAL LOW (ref 12.0–15.0)
MCH: 30.7 pg (ref 26.0–34.0)
MCHC: 32.5 g/dL (ref 30.0–36.0)
MCV: 94.7 fL (ref 80.0–100.0)
Platelets: 509 10*3/uL — ABNORMAL HIGH (ref 150–400)
RBC: 2.83 MIL/uL — ABNORMAL LOW (ref 3.87–5.11)
RDW: 15.6 % — ABNORMAL HIGH (ref 11.5–15.5)
WBC: 10.6 10*3/uL — ABNORMAL HIGH (ref 4.0–10.5)
nRBC: 0 % (ref 0.0–0.2)

## 2022-01-28 LAB — GLUCOSE, CAPILLARY
Glucose-Capillary: 79 mg/dL (ref 70–99)
Glucose-Capillary: 71 mg/dL (ref 70–99)
Glucose-Capillary: 88 mg/dL (ref 70–99)
Glucose-Capillary: 92 mg/dL (ref 70–99)

## 2022-01-28 MED ORDER — BIOTENE DRY MOUTH MT LIQD: 15.0000 mL | OROMUCOSAL | Status: DC | PRN

## 2022-01-28 MED ORDER — GLYCOPYRROLATE 1 MG PO TABS
1.0000 mg | ORAL_TABLET | ORAL | Status: DC | PRN
  Filled 2022-01-28: qty 1

## 2022-01-28 MED ORDER — HYDROMORPHONE HCL 1 MG/ML IJ SOLN
0.5000 mg | INTRAMUSCULAR | Status: DC | PRN
  Administered 2022-01-29: 0.5 mg via INTRAVENOUS
  Filled 2022-01-28: qty 0.5

## 2022-01-28 MED ORDER — LORAZEPAM 2 MG/ML PO CONC: 1.0000 mg | ORAL | Status: DC | PRN

## 2022-01-28 MED ORDER — LORAZEPAM 1 MG PO TABS
1.0000 mg | ORAL_TABLET | ORAL | Status: DC | PRN
  Administered 2022-01-29: 1 mg via ORAL
  Filled 2022-01-28: qty 1

## 2022-01-28 MED ORDER — GLYCOPYRROLATE 0.2 MG/ML IJ SOLN: 0.2000 mg | INTRAMUSCULAR | Status: DC | PRN

## 2022-01-28 MED ORDER — HYDROMORPHONE HCL 1 MG/ML IJ SOLN
0.5000 mg | Freq: Four times a day (QID) | INTRAMUSCULAR | Status: DC
  Administered 2022-01-28 – 2022-01-29 (×4): 0.5 mg via INTRAVENOUS
  Filled 2022-01-28 (×4): qty 0.5

## 2022-01-28 MED ORDER — LORAZEPAM 2 MG/ML IJ SOLN: 1.0000 mg | INTRAMUSCULAR | Status: DC | PRN

## 2022-01-28 MED ORDER — POLYVINYL ALCOHOL 1.4 % OP SOLN: 1.0000 [drp] | Freq: Four times a day (QID) | OPHTHALMIC | Status: DC | PRN

## 2022-01-28 NOTE — Progress Notes (Signed)
? ? ?12 Days Post-Op  ?Subjective: ?Patient endorses continued poor appetite. Hgb up to 8.7 today. Remains hemodynamically stable. ? ?Objective: ?Vital signs in last 24 hours: ?Temp:  [97.5 ?F (36.4 ?C)-98.8 ?F (37.1 ?C)] 97.5 ?F (36.4 ?C) (04/13 1219) ?Pulse Rate:  [55-68] 66 (04/13 0724) ?Resp:  [18-20] 18 (04/13 0724) ?BP: (136-150)/(44-56) 140/56 (04/13 0724) ?SpO2:  [96 %-100 %] 98 % (04/13 0724) ?Last BM Date :  (ostomy) ? ?Intake/Output from previous day: ?04/12 0701 - 04/13 0700 ?In: 614.9 [P.O.:100; I.V.:514.9] ?Out: 630 [Urine:600; Stool:30] ?Intake/Output this shift: ?No intake/output data recorded. ? ?PE: ?Gen:  Alert, NAD, pleasant ?Heart: RRR ?Lungs: normal work of breathing ?Abd: soft, nondistended, colostomy productive of stool. Midline incision is open at skin, fascia appears to be in tact with no visible bowel. ? ? ? ? ? ?Lab Results:  ?Recent Labs  ?  01/27/22 ?0130 01/28/22 ?0719  ?WBC 9.9 10.6*  ?HGB 7.7* 8.7*  ?HCT 23.9* 26.8*  ?PLT 376 509*  ? ?BMET ?Recent Labs  ?  01/27/22 ?0922  ?NA 138  ?K 4.3  ?CL 112*  ?CO2 22  ?GLUCOSE 88  ?BUN 22  ?CREATININE 0.61  ?CALCIUM 7.3*  ? ?PT/INR ?No results for input(s): LABPROT, INR in the last 72 hours. ?CMP  ?   ?Component Value Date/Time  ? NA 138 01/27/2022 0922  ? K 4.3 01/27/2022 0922  ? CL 112 (H) 01/27/2022 7588  ? CO2 22 01/27/2022 0922  ? GLUCOSE 88 01/27/2022 0922  ? BUN 22 01/27/2022 0922  ? CREATININE 0.61 01/27/2022 0922  ? CALCIUM 7.3 (L) 01/27/2022 3254  ? PROT 3.7 (L) 01/22/2022 0143  ? ALBUMIN <1.5 (L) 01/22/2022 0143  ? AST 67 (H) 01/22/2022 0143  ? ALT 62 (H) 01/22/2022 0143  ? ALKPHOS 202 (H) 01/22/2022 0143  ? BILITOT 0.5 01/22/2022 0143  ? GFRNONAA >60 01/27/2022 0922  ? GFRAA >60 10/10/2019 0307  ? ?Lipase  ?   ?Component Value Date/Time  ? LIPASE 37 01/15/2022 1704  ? ? ?Studies/Results: ?No results found. ? ?Anti-infectives: ?Anti-infectives (From admission, onward)  ? ? Start     Dose/Rate Route Frequency Ordered Stop  ? 01/19/22  1400  piperacillin-tazobactam (ZOSYN) IVPB 3.375 g       ? 3.375 g ?12.5 mL/hr over 240 Minutes Intravenous Every 8 hours 01/19/22 0842 01/23/22 0940  ? 01/17/22 1400  piperacillin-tazobactam (ZOSYN) IVPB 2.25 g  Status:  Discontinued       ? 2.25 g ?100 mL/hr over 30 Minutes Intravenous Every 8 hours 01/17/22 0725 01/19/22 0842  ? 01/16/22 1800  piperacillin-tazobactam (ZOSYN) IVPB 2.25 g  Status:  Discontinued       ? 2.25 g ?100 mL/hr over 30 Minutes Intravenous Every 6 hours 01/16/22 1420 01/17/22 0725  ? 01/16/22 1000  piperacillin-tazobactam (ZOSYN) IVPB 3.375 g  Status:  Discontinued       ? 3.375 g ?12.5 mL/hr over 240 Minutes Intravenous Every 12 hours 01/15/22 2129 01/16/22 1420  ? 01/15/22 1945  piperacillin-tazobactam (ZOSYN) IVPB 3.375 g       ? 3.375 g ?100 mL/hr over 30 Minutes Intravenous  Once 01/15/22 1939 01/15/22 2045  ? ?  ? ? ? ?Assessment/Plan ?POD 12, s/p  ex lap with Hartmann's procedure by Dr. Dwain Sarna 01/16/22 for stercoral ulcer perforation ?- Tolerating soft diet and having bowel function, oral intake remains minimal.  ?- Antibiotics completed. WBC normalized 9.9 (11.0) ?- Superficial wound dehiscence: Fascia appears in tact, but patient is  at risk for dehiscence and evisceration given malnutrition. Keep mepitel in wound base, continue saline WTD over mepitel. ?- Bleeding per rectum: Hgb increased today ?- WOC consult for new colostomy, appreciate their assistance ?- PT/OT recommending SNF, TOC looking for placement  ?- Palliative care consulted, family meeting planned for this afternoon. Pending the family's goals of care after this discussion, will need to consider placement of a cortrak feeding tube to supplement nutrition. Patient is at risk of ongoing wound healing complications. If bleeding per rectum persists, may need to consider a flex sig, again depending on patient and family's goals of care. ?  ?FEN - Soft, ensure ?VTE - SCDs; Subq heparin on hold given recent bleeding  ?ID -  zosyn 3/31 - 4/8 ?  ?Bacteremia - completed abx ?Acute hypoxic respiratory failure - extubated and off O2 ?Septic shock - resolved ?AKI - improved, making urine ?Hypothyroidism - on synthroid ?Dementia ?Hx HF ?  ?Appreciate hospitalist assistance. ? ? LOS: 13 days  ? ? ?Dwan Bolt, MD ?[facility] ?01/28/2022, 9:01 AM ?Please see Amion for pager number during day hours 7:00am-4:30pm ? ?

## 2022-01-28 NOTE — Progress Notes (Signed)
Nutrition Follow-up ? ?DOCUMENTATION CODES:  ? ?Not applicable ? ?INTERVENTION:  ? ?Continue Multivitamin w/ minerals daily ?Continue Ensure Enlive po TID, each supplement provides 350 kcal and 20 grams of protein. ?Discontinue 30 ml ProSource Plus  ?If within [place] would recommend placing Cortrak and initiating tube feeds: ?Recommend starting Osmolite 1.2 @ 20 mL/hr and advance by 10 mL/hr q8h until goal rate of 60 mL/hr is reached (1440 mL/day) ?45 mL ProSource TF - Daily  ?30 mL free water flush q4h for tube maintenance ?Provides 1768 kcal, 91 gm of protein, and 1181 mL free water (1361 mL total) daily.  ? ?NUTRITION DIAGNOSIS:  ? ?Inadequate oral intake related to poor appetite as evidenced by meal completion < 50%. - Ongoing ? ?GOAL:  ? ?Patient will meet greater than or equal to 90% of their needs - Ongoing ? ?MONITOR:  ? ?PO intake, Supplement acceptance, Labs, Weight trends, Skin, I & O's ? ?REASON FOR ASSESSMENT:  ? ?Consult ?Assessment of nutrition requirement/status ? ?ASSESSMENT:  ? ?17 yof who lives in assisted living. Per her son [name] she needs help getting dressed.  She uses a walker. For past 3 weeks she has not been eating. She has struggled with constipation her whole life. She has been disimpacted  a couple times recently.  She also had some loose stool placed on an antidiarrheal at facility as well.  She had ab pain today.  This is present. Nothing making it better. No fever. No emesis. Last bm yesterday. She is really nonverbal when I see her.  Her son [name] is her medical poa and he is present. I eventually also discussed this with her sister [name] via telephone.  She underwent evaluation and was found to have normal vitals, normal wbc, cr 1.77.  she had ct scan that shows some pneumoperitoneum, diverticuli, air around sigmoid colon with large stool burden. ? ?4/01 - Procedure s/p sigmoid colectomy w/ end colostomy; NGT placed  ?4/02 - NGT removed ?4/03 - Extubated  ?4/04 - diet advanced to clear  liquids ?4/06 - diet advanced to full liquids ?4/08 - diet advanced to SOFT diet ?4/10 - Wound VAC placed on abdomen ?4/12 - Wound VAC removed ? ?Pt unavailable at RD attempt to visit. ? ?Per EMR, pt intake includes: ?4/10: Breakfast 40%, Lunch 10%, Dinner 10% ?4/11: Breakfast 10% ?4/12: Breakfast 5% ? ?RD sent secure chat to RN to assess pt intake of supplements.  Per MAR, pt has been accepting all ProSource Plus and Ensure supplements that are offered. RN confirms that pt is drinking minimal of the supplement, typically only about 10% of it at a time.  ? ?Per Palliative Medicaine note, plan for a family meeting today to have ongoing goals of care discussion.  ? ?Medications reviewed and include: MVI ?Labs reviewed: Vitamin B12 2038 (H) ? ?NUTRITION - FOCUSED PHYSICAL EXAM: ? ?Deferred to follow-up.  ? ?Diet Order:   ?Diet Order   ? ?       ?  DIET SOFT Room service appropriate? No; Fluid consistency: Thin  Diet effective now       ?  ? ?  ?  ? ?  ? ?EDUCATION NEEDS:  ? ?No education needs have been identified at this time ? ?Skin:  Skin Assessment: Skin Integrity Issues: ?Skin Integrity Issues:: Incisions ?Incisions: closed abdomen ? ?Last BM:  4/12 - 30 mL via colostomy ? ?Height:  ?Ht Readings from Last 1 Encounters:  ?01/15/22 5\' 2"  (1.575 m)  ? ?Weight:  ?  Wt Readings from Last 1 Encounters:  ?01/15/22 68 kg  ? ?Ideal Body Weight:  50 kg ? ?BMI:  Body mass index is 27.44 kg/m?. ? ?Estimated Nutritional Needs: ?Kcal:  1700-1900 ?Protein:  85-100 grams ?Fluid:  > 1.7 L ? ? ? ?Hermina Barters RD, LDN ?Clinical Dietitian ?See AMiON for contact information.  ? ?

## 2022-01-28 NOTE — TOC Progression Note (Signed)
Transition of Care (TOC) - Progression Note  ?Donn Pierini Charity fundraiser, BSN ?Transitions of Care ?Unit 4E- RN Case Manager ?See Treatment Team for direct phone #  ? ? ?Patient Details  ?Name: LORRE OPDAHL ?MRN: [id number] ?Date of Birth: 08/13/1931 ? ?Transition of Care (TOC) CM/SW Contact  ?Darrold Span, RN ?Phone Number: ?01/28/2022, 3:37 PM ? ?Clinical Narrative:    ?Received msg from [facility] that pt and family have decided on Residential Hospice and transition to comfort care. CM went to room to confirm with pt and family and offer choice for Residential Hospice placement. Daughter Boneta Lucks still at the bedside, son at left. Per discussion with patient and Boneta Lucks they have decided on Hospice and are thinking between [facility] in [facility]. They are leaning on Hosp[facility] Home [facility] which [facility] be [facility] [place] to be closer to family. Daughter Boneta Lucks made call to son- and placed him on speaker phone to discuss over phone how referral works and timing. Explained that once Hospice has referral and reviews then bed availability is unknown and could be as soon as tomorrow or could take days. Son/daughter requesting that they have this evening to process everything and continue speaking with their mom to make sure the decisions are in line to what patient wants. Requesting a Chaplain to come speak with patient here at the bedside, and TOC/CM will follow up with pt/family in the am prior to making Hospice referral.  ? ?CM has asked unit secretary to call Chaplain for unit to come see patient this afternoon.  ? ?CM will hold off making referral to [facility] for [facility] until follow up with pt and family in the am.  ? ? ?Expected Discharge Plan: Skilled Nursing Facility ?Barriers to Discharge: Continued Medical Work up ? ?Expected Discharge Plan and Services ?Expected Discharge Plan: Skilled Nursing Facility ?  ?  ?Post Acute Care Choice: Skilled Nursing Facility ?Living  arrangements for the past 2 months: Assisted Living Facility ?                ?  ?  ?  ?  ?  ?  ?  ?  ?  ?  ? ? ?Social Determinants of Health (SDOH) Interventions ?  ? ?Readmission Risk Interventions ?   ? View : No data to display.  ?  ?  ?  ? ? ?

## 2022-01-28 NOTE — Consult Note (Addendum)
? ?                                                                                ?Consultation Note ?Date: 01/28/2022  ? ?Patient Name: Alexis Cortez  ?DOB: December 14, 1930  MRN: [id number]  Age / Sex: 88 y.o., female  ?PCP: Jettie Booze, NP ?Referring Physician: Edison Pace, Md, MD ? ?Reason for Consultation:  goals of care ? ?HPI/Patient Profile: 88 y.o. female  with past medical history of cognitive impairment, living at [facility], HTN, anxiety admitted on 01/15/2022 with findings of stercoral ulcer with sigmoid colon perforation, bacteremia, peritonitis. She underwent colectomy and colostomy placement. Recover has been complicated due to poor po intake. She is not eating or drinking. Her albumin is <1.5. She has had decreased Hgb due to rectal bleeding. She has not been able to get out of bed. Her surgical wound has dehisced and is at risk of evisceration which would require more surgery. ?Palliative medicine consulted for goals of care and medical decision making re: placement of feeding tube, colonoscopy, more surgery if wound dehisces vs comfort measures.  ? ?Primary Decision Maker ?HCPOA - son Alexis Cortez is HCPOA ? ?Discussion: ? ?Chart reviewed including progress notes labs and imaging. ?Patient states, "I don't feel good" She has ongoing abdominal pain. She doesn't request pain medication unless it is offered- she feels she needs pain medication now.  ?Met at patient's bedside with her son [name] and daughter [name]. ?Prior to this admission patient was living at [place] assisted living.  She moved here from [place].  She was unhappy with this transition.  She has had ongoing decline in her quality of life.  She had not been eating or drinking for quite some time before this admission.  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  She was very strong and independent and had valued her independence. ?We discussed her current illness and possible trajectories. ?Options for  continued aggressive care with placement of feeding tube, colonoscopy for evaluation of rectal bleeding, and possibly more surgery if her bowels eviscerated. ?Also discussed comfort care-not placing feeding tube allowing for comfort feeding, stopping labs and medications that are life-prolonging but providing more aggressive symptom support. ?Patient was able to verbalize her preference for comfort care she does not want a feeding tube she understands that this means that her time will be limited and she prefers to spend that time with the best quality and comfort possible. ?They would like to seek possible inpatient hospice bed. ? ?SUMMARY OF RECOMMENDATIONS ?-Bowel perforation s/p colectomy/colostomy, bacteremia, wound dehiscence, failure to thrive- plan for comfort measures only and request inpatient hospice bed  ?-Will scheduled IV hydromorphone .32m q6hrs for more adequate pain control and since she doesn't remember to ask for it- can be held if she denies pain  ? ?Code Status/Advance Care Planning: ?DNR ? ? ?Prognosis:   ?< 2 weeks ? ?Discharge Planning: Hospice facility ? ?Primary Diagnoses: ?Present on Admission: ? Pneumoperitoneum/perforated sigmoid colon ? Perforated ulcer ([place] ? ? ?Review of Systems ? ?Physical Exam ? ?Vital Signs: BP (!) 151/45 (BP Location: Left Leg)   Pulse (!) 57  Temp 98.6 ?F (37 ?C) (Oral)   Resp 20   Ht '5\' 2"'  (1.575 m)   Wt 68 kg   SpO2 99%   BMI 27.44 kg/m?  ?Pain Scale: 0-10 ?POSS *See Group Information*: 1-Acceptable,Awake and alert ?Pain Score: 0-No pain ? ? ?SpO2: SpO2: 99 % ?O2 Device:SpO2: 99 % ?O2 Flow Rate: .O2 Flow Rate (L/min): 2 L/min ? ?IO: Intake/output summary:  ?Intake/Output Summary (Last 24 hours) at 01/28/2022 1454 ?Last data filed at 01/28/2022 1100 ?Gross per 24 hour  ?Intake 359.17 ml  ?Output 925 ml  ?Net -565.83 ml  ? ? ?LBM: Last BM Date : 01/28/22 ?Baseline Weight: Weight: 68 kg ?Most recent weight: Weight: 68 kg     ? ? ? ?Thank you for this  consult. Palliative medicine will continue to follow and assist as needed.  ? ?Time Total: 100 mins ?Greater than 50%  of this time was spent counseling and coordinating care related to the above assessment and plan. ? ?Signed by: ?Mariana Kaufman, AGNP-C ?Palliative Medicine ? ?  ?Please contact Palliative Medicine Team phone at [phone] for questions and concerns.  ?For individual provider: See Amion ? ? ? ? ? ? ? ? ? ? ? ? ? ? ?

## 2022-01-28 NOTE — Consult Note (Signed)
? Alexis Cortez  NAT:[id number] DOB: 07/18/1931 DOA: 01/15/2022 ?PCP: Jettie Booze, NP   ? ?Brief Narrative:  ?88yo with a history of HTN, anxiety, cognitive impairment, and ambulatory dysfunction dependent upon a walker who presented with abdominal pain and was found to have pneumoperitoneum.  She was admitted to the general surgery service 01/15/2022 and ultimately underwent sigmoid colectomy with end colostomy/1/23 for a perforated sigmoid colon with peritonitis, felt to be related to a stercoral ulcer perforation.  TRH has been consulted for assistance in medical care.  Her progress has been slow. ? ?Code Status: NO CODE BLUE ? ?DVT prophylaxis: ?SCDs ? ?Interim Hx: ?Afebrile.  Vital signs stable.  Saturation 98% room air.  Tolerating soft diet.  Bowels functioning.  Admits to poor appetite. ? ?Palliative Care has met with the family and the decision has been made to pursue full comfort focused care.  ? ?Assessment & Plan: ? ?Pneumoperitoneum / perforated sigmoid colon stercoral ulcer  ?S/p sigmoid colectomy and end colostomy by Donne Hazel - surgical wound care, JP drain and colostomy care per general surgery ? ?Superficial wound dehiscence ?Ongoing care per General Surgery ? ?Goals of care, counseling/discussion ?Pt and son agreed to DNR status earlier in hospital stay - now to transition to full comfort care only  ? ?Acute respiratory failure  ?Patient required mechanical ventilation and ICU stay postoperatively - extubated on 4/3 - resp status stable presently  ? ?Acute kidney injury ?Resolved ? ?Bacteremia ?Blood culture on 3/31 with bacteroids thetaiotaomicron - likely GI translocation - was tx w/ IV Zosyn ? ?Abnormal echocardiogram ?Limited TTE on 4/1 with probably mild to moderately decreased LVEF and dilated RV with what appears to be severely reduced RVSF but very limited window.  ? ?Hypocalcemia ?Corrects to normal for hypoalbuminemia ? ?Thrombocytopenia  ?Likely secondary to acute illness -  resolved ? ?Hypoalbuminemia - Poor nutrition ? ?Normocytic anemia ?Likely due to acute illness and some surgical blood loss/bleeding per rectum - Hgb stable for now  ? ?Hyponatremia ?Resolved ? ?Cognitive impairment ?dependent for ADLs ? ?Essential hypertension ?Blood pressure reasonably controlled at this time ? ?Septic shock ?due to perforated bowel and bacteremia - resolved ? ?Objective: ?Blood pressure (!) 140/56, pulse 66, temperature (!) 97.5 ?F (36.4 ?C), temperature source Oral, resp. rate 18, height 5' 2" (1.575 m), weight 68 kg, SpO2 98 %. ? ?Intake/Output Summary (Last 24 hours) at 01/28/2022 0929 ?Last data filed at 01/27/2022 1706 ?Gross per 24 hour  ?Intake 614.94 ml  ?Output 600 ml  ?Net 14.94 ml  ? ? ?Filed Weights  ? 01/15/22 1617  ?Weight: 68 kg  ? ? ?Examination: ?No exam by [facility] today as pt/family were meeting w/ Palliative Care. ? ? ?CBC: ?Recent Labs  ?Lab 01/26/22 ?0709 01/27/22 ?0130 01/28/22 ?0719  ?WBC 11.0* 9.9 10.6*  ?HGB 8.7* 7.7* 8.7*  ?HCT 27.3* 23.9* 26.8*  ?MCV 93.8 94.8 94.7  ?PLT 412* 376 509*  ? ? ?Basic Metabolic Panel: ?Recent Labs  ?Lab 01/24/22 ?0157 01/25/22 ?0412 01/27/22 ?4270  ?NA 132* 134* 138  ?K 4.3 4.5 4.3  ?CL 107 108 112*  ?CO2 20* 22 22  ?GLUCOSE 98 92 88  ?BUN 19 24* 22  ?CREATININE 0.83 0.81 0.61  ?CALCIUM 6.9* 7.3* 7.3*  ? ? ?GFR: ?Estimated Creatinine Clearance: 44 mL/min (by C-G formula based on SCr of 0.61 mg/dL). ? ?Liver Function Tests: ?Recent Labs  ?Lab 01/22/22 ?0143  ?AST 67*  ?ALT 62*  ?ALKPHOS 202*  ?BILITOT 0.5  ?PROT  3.7*  ?ALBUMIN <1.5*  ? ? ? ?CBG: ?Recent Labs  ?Lab 01/26/22 ?8502 01/26/22 ?1655 01/27/22 ?7741 01/28/22 ?0101 01/28/22 ?2878  ?GLUCAP 90 77 79 79 71  ? ? ? ?Scheduled Meds: ? (feeding supplement) PROSource Plus  30 mL Oral TID BM  ? acetaminophen  1,000 mg Oral Q6H  ? busPIRone  7.5 mg Oral BID  ? feeding supplement  237 mL Oral TID BM  ? levothyroxine  75 mcg Oral Q0600  ? melatonin  3 mg Oral QHS  ? mirtazapine  15 mg Oral QHS  ?  multivitamin with minerals  1 tablet Oral Daily  ? OLANZapine  2.5 mg Oral QHS  ? ?Continuous Infusions: ? dextrose 5% lactated ringers 50 mL/hr at 01/28/22 0602  ? ? ? LOS: 13 days  ? ?Cherene Altes, MD ?[facility] ?Office  [phone] ?Pager - Text Page per Shea Evans ? ?If 7PM-7AM, please contact night-coverage per Amion ?01/28/2022, 9:29 AM ? ? ? ? ?

## 2022-01-28 NOTE — Progress Notes (Signed)
?  01/28/22 1525  ?Clinical Encounter Type  ?Visited With Patient and family together ?(Daughter [name])  ?Visit Type Spiritual support;Initial ?(Patient will be transitioning to [place])  ?Referral From Nurse ?(Paged by CM Gerrit Heck, RN at the request of patient's daughter)  ?Consult/Referral To Chaplain ?Albertina Parr [place])  ?Spiritual Encounters  ?Spiritual Needs Emotional  ?Stress Factors  ?Patient Stress Factors Health changes;Major life changes ?(Fear, Questioning)  ?Family Stress Factors Health changes ?(Sadness)  ? ?Met with Ms. Alexis Cortez, her daughter [name], and her neighbor/friend of 20 years, [name] at patient's bedside. [name] has two children, three grandchildren, and one great grandchild. Patient's daughter shared that her mother is considering levels of extended care, such as feeding-tube, that [name] wants as she transitions to comfort care and is transferred to hospice care in the coming days. She is weighing the discomfort that the tube may cause and how it might only prolong life and suffering.  ? ?Chaplain provided meaningful presence and offered invitation for [name] to openly share her feelings, thoughts, and emotion. Chaplain acknowledged the difficulty of communicating her wishes and discussing the decisions she has made with family. [name] and her daughter expressed desire to be kept as pain free, clean, and comfortable as possible through her transition of life.  ? ?[name] was encouraged to openly express and share her love and feelings with those important to her, such as family and friends. [name] told me that she finds her strength through her faith. She was invited to share her faith but elected not to discuss at this time. [name] presents as being comfortable with her decisions but has questions and fears about death. ?[name] and her daughter were made aware of spiritual care availability 24 hours per day. ? ?[name], [name], [phone]. ?

## 2022-01-29 DIAGNOSIS — R7881 Bacteremia: Secondary | ICD-10-CM | POA: Diagnosis not present

## 2022-01-29 DIAGNOSIS — Z515 Encounter for palliative care: Secondary | ICD-10-CM

## 2022-01-29 DIAGNOSIS — K668 Other specified disorders of peritoneum: Secondary | ICD-10-CM | POA: Diagnosis not present

## 2022-01-29 DIAGNOSIS — Z7189 Other specified counseling: Secondary | ICD-10-CM | POA: Diagnosis not present

## 2022-01-29 DIAGNOSIS — E8809 Other disorders of plasma-protein metabolism, not elsewhere classified: Secondary | ICD-10-CM | POA: Diagnosis not present

## 2022-01-29 DIAGNOSIS — R627 Adult failure to thrive: Secondary | ICD-10-CM

## 2022-01-29 LAB — GLUCOSE, CAPILLARY: Glucose-Capillary: 76 mg/dL (ref 70–99)

## 2022-01-29 NOTE — Progress Notes (Signed)
? ?  Palliative Care met with the family yesterday and the decision has been made to transition to comfort care, with plans for residential hospice placement.  I fully support this decision. ? ?Given this development, and the ongoing exceptional care of the general surgery service, I will sign off as I have nothing further to add. ? ?TRH remains available 24/7 and is happy to assist in any way that may benefit this patient in the future.  ? ?Cherene Altes, MD ?[facility] ?Office  [phone] ? ?01/29/2022, 11:21 AM ? ?

## 2022-01-29 NOTE — Care Management Important Message (Signed)
Important Message ? ?Patient Details  ?Name: Alexis Cortez ?MRN: [id number] ?Date of Birth: 1931/08/22 ? ? ?Medicare Important Message Given:  Yes ? ? ? ? ?Renie Ora ?01/29/2022, 11:44 AM ?

## 2022-01-29 NOTE — TOC Transition Note (Signed)
Transition of Care (TOC) - CM/SW Discharge Note ?Donn Pierini Charity fundraiser, BSN ?Transitions of Care ?Unit 4E- RN Case Manager ?See Treatment Team for direct phone #  ? ? ?Patient Details  ?Name: Alexis Cortez ?MRN: [id number] ?Date of Birth: 1931-04-23 ? ?Transition of Care (TOC) CM/SW Contact:  ?Zenda Alpers, Lenn Sink, RN ?Phone Number: ?01/29/2022, 12:02 PM ? ? ?Clinical Narrative:    ?Follow up done with patient and son-Jeff at the bedside this am regarding Hospice needs. Per conversation patient and son confirm they do want to move forward with referral for Residential Hospice at [facility] the [place]. Son reports he has spoken with PC again this am and is also requesting to speak with someone from the surgery team. CM will let bedside RN know to have someone from [facility] reach out to family.  ? ?Call made to Norm Parcel with [facility] for Residential Hospice referral for [employer] Dennard Nip has spoken with son and they are agreeable to move forward with Hospice referral.  ?Per Cheir once referral has been approved they will have bed available for transition today. Pt will transport via [facility].  ? ?12noon- have received word from Cheri that pt has been approved for Hospice services at [facility] awaiting for family to complete paperwork.  ?Family has completed paperwork- d/c order requested from MD- GOLD DNR to be done by [facility].  ? ?1320- GOLD DNR has been completed- Pt ready for transport- [facility] called to place pt on list for transport to [facility]- Per [facility] pt is 5th on the list for [facility] at this time- ETA for transport is one hour to 1 1/2 at the earliest.  ?Bedside RNs aware as well as Cheri with [facility] [place].  Paperwork has been placed on chart for transport.  ? ?Nursing can call report to [phone] ? ? ? ? ?Final next level of care: Hospice Medical Facility ?Barriers to Discharge: Barriers Resolved ? ? ?Patient Goals and CMS Choice ?Patient states  their goals for this hospitalization and ongoing recovery are:: comfort care/ Hospice ?CMS Medicare.gov Compare Post Acute Care list provided to:: Patient ?Choice offered to / list presented to : Patient, Adult Children ? ?Discharge Placement ?  ?           ? Residential Hospice Home ?  ?  ?  ? ?Discharge Plan and Services ?In-house Referral: Hospice / Palliative Care ?Discharge Planning Services: CM Consult ?Post Acute Care Choice: Hospice          ?  ?  ?  ?  ?  ?  ?HH Agency: [facility] ?Date HH Agency Contacted: 01/29/22 ?Time HH Agency Contacted: 1056 ?Representative spoke with at [facility] Agency: Norm Parcel ? ?Social Determinants of Health (SDOH) Interventions ?  ? ? ?Readmission Risk Interventions ? ?  01/29/2022  ? 10:57 AM  ?Readmission Risk Prevention Plan  ?Transportation Screening Complete  ?PCP or Specialist Appt within 3-5 Days Not Complete  ?Not Complete comments Hospice  ?HRI or Home Care Consult Complete  ?Social Work Consult for Recovery Care Planning/Counseling Complete  ?Palliative Care Screening Complete  ?Medication Review Oceanographer) Complete  ? ? ? ? ? ?

## 2022-01-29 NOTE — Progress Notes (Signed)
? ?                                                                                                                                                     ?                                                   ?Daily Progress Note  ? ?Patient Name: Alexis Cortez       Date: 01/29/2022 ?DOB: Apr 04, 1931  Age: 88 y.o. MRN#: [id number] ?Attending Physician: Edison Pace Md, MD ?Primary Care Physician: Jettie Booze, NP ?Admit Date: 01/15/2022 ? ?Reason for Consultation/Follow-up: Establishing goals of care ? ?Patient Profile/HPI:  88 y.o. female  with past medical history of cognitive impairment, living at [facility], HTN, anxiety admitted on 01/15/2022 with findings of stercoral ulcer with sigmoid colon perforation, bacteremia, peritonitis. She underwent colectomy and colostomy placement. Recover has been complicated due to poor po intake. She is not eating or drinking. Her albumin is <1.5. She has had decreased Hgb due to rectal bleeding. She has not been able to get out of bed. Her surgical wound has dehisced and is at risk of evisceration which would require more surgery. ?Palliative medicine consulted for goals of care and medical decision making re: placement of feeding tube, colonoscopy, more surgery if wound dehisces vs comfort measures.  ? ?Subjective: ?Patient is awake. Breakfast tray at bedside is untouched. She feels pain is well controlled with current scheduled hydromorphone. She needed one dose of prn hydromorphone IV this morning, and lorazepam last night.  ?Spoke with son [name] at length outside the room.  ?He shared emotions regarding his relationship with his mom. He wants to ensure that she does not experience any suffering. He would like to hear from surgery team that they are in agreement with comfort and hospice.  ?  ?Review of Systems  ?Unable to perform ROS: Acuity of condition  ? ? ?Physical Exam ?Vitals and nursing note reviewed.  ?Constitutional:   ?   Comments: Frail, withdrawn  ?Pulmonary:  ?    Effort: Pulmonary effort is normal.  ?Neurological:  ?   Mental Status: She is oriented to person, place, and time.  ?   Comments: lethargic  ?         ? ?Vital Signs: BP (!) 136/59 (BP Location: Left Arm)   Pulse 64   Temp (!) 97.5 ?F (36.4 ?C) (Axillary)   Resp 14   Ht 5\' 2"  (1.575 m)   Wt 68 kg   SpO2 100%   BMI 27.44 kg/m?  ?SpO2: SpO2: 100 % ?O2 Device: O2 Device: Room Air ?O2 Flow Rate: O2 Flow Rate (L/min): 2 L/min ? ?  Intake/output summary:  ?Intake/Output Summary (Last 24 hours) at 01/29/2022 1123 ?Last data filed at 01/29/2022 0912 ?Gross per 24 hour  ?Intake 1118.09 ml  ?Output 1310 ml  ?Net -191.91 ml  ? ?LBM: Last BM Date : 01/28/22 ?Baseline Weight: Weight: 68 kg ?Most recent weight: Weight: 68 kg ? ?     ?Palliative Assessment/Data: PPS: 10% ? ? ? ? ? ?Patient Active Problem List  ? Diagnosis Date Noted  ? Essential hypertension 01/20/2022  ? Cognitive impairment 01/20/2022  ? Hyponatremia 01/20/2022  ? Normocytic anemia 01/20/2022  ? Hypoalbuminemia 01/20/2022  ? Thrombocytopenia ([place]) 01/20/2022  ? Hypocalcemia 01/20/2022  ? Physical deconditioning 01/20/2022  ? Goals of care, counseling/discussion 01/20/2022  ? Abnormal echocardiogram 01/20/2022  ? Bacteremia 01/20/2022  ? Perforated ulcer ([place]) 01/16/2022  ? Septic shock ([place])   ? AKI (acute kidney injury) ([place])   ? Acute respiratory failure ([place])   ? Pneumoperitoneum/perforated sigmoid colon 01/15/2022  ? Sinus bradycardia   ? COVID-19 virus infection 10/05/2019  ? ? ?Palliative Care Assessment & Plan  ? ? ?Assessment/Recommendations/Plan ? ?Continue full comfort measures only as planned ?Symptoms are well controlled with current interventions ?Hopeful for d/c to residential hospice ? ? ?Code Status: ?DNR ? ?Prognosis: ? < 2 weeks ? ?Discharge Planning: ?Hospice facility ? ?Care plan was discussed with son and care team.  ? ?Thank you for allowing the Palliative Medicine Team to assist in the care of this patient. ? ? ? ?Mariana Kaufman,  AGNP-C ?Palliative Medicine ? ? ?Please contact Palliative Medicine Team phone at [phone] for questions and concerns.  ? ? ? ? ? ? ?

## 2022-01-29 NOTE — Discharge Summary (Signed)
[facility] ?Discharge Summary  ? ?Patient ID: ?Alexis Cortez ?MRN: [id number] ?DOB/AGE: 86/04/1931 86 y.o. ? ?Admit date: 01/15/2022 ?Discharge date: 01/29/2022 ? ?Admitting Diagnosis: ?Pneumoperitoneum [K66.8] ?Generalized abdominal pain [R10.84] ?Perforated ulcer ([place]) [K27.5] ?Constipation, unspecified constipation type [K59.00] ? ? ?Discharge Diagnosis ?Perforated ulcer ([place]) [K27.5] ?Stercoral ulcer perforation ?Superficial wound dehiscence ? ?Consultants ?Hospitalist Team ?Palliative Care ?Pulmonary Critical Care Medicine  ? ?Imaging: ?No results found. ? ?Procedures ?Dr. Donne Hazel (01/16/22) - sigmoid colectomy, end colostomy ? ?Hospital Course:  ?88 year old female who presented to [facility] ED from assisted living with decreased appetite for several weeks and abdominal pain in setting of chronic constipation.  Workup showed pneumoperitoneum.  Patient was admitted and underwent procedure listed above with findings of stercoral ulcer with perforation and feculent peritonitis. She initially remained intubated postoperatively and CCM was consulted for assistance with septic shock and postoperative acute respiratory failure. She was ultimately extubated and transferred out of the ICU. Hospitalist team was consulted for continued assistance with medical management. [place] nursing evaluated and followed patient for assistance with new ostomy. Bowel function was monitored and diet was appropriately advanced as tolerated. Labs monitored and she was found to have acute anemia with blood per rectum presumed to be secondary to hemorrhoids and/or bleeding from rectal stump staple line. Bleeding improved and hemoglobin stabilized after red blood cell transfusion. Midline incision was initially healing well and a wound vac was placed. However she ultimately developed wound dehiscence without evisceration and vac was discontinued. At time of discharge wound being managed with wet to dry dressing changes with mepitel base.    ?Prior to hospitalization patient had been experiencing some cognitive impairment and ongoing decline in her quality of life with poor nutrition prior to admission. She continued to have poor appetite and PO intake and given the risk for evisceration and therefore possible further indications for aggressive interventions, palliative care was consulted. After discussion patient and family decided against agressive treatment and chose to pursue comfort care with hospice. On POD13, patient was offered a hospice bed and was discharged to [facility] in [facility]. ? ?I was not directly involved in this patient's care on date of discharge and therefore the information in this discharge summary was taken from the chart. ? ?Allergies as of 01/29/2022   ?No Known Allergies ?  ? ?  ?Medication List  ?  ? ?STOP taking these medications   ? ?amLODipine 5 MG tablet ?Commonly known as: NORVASC ?  ?lisinopril 10 MG tablet ?Commonly known as: ZESTRIL ?  ?NON FORMULARY ?  ? ?  ? ?TAKE these medications   ? ?acetaminophen 500 MG tablet ?Commonly known as: TYLENOL ?Take 500 mg by mouth every 6 (six) hours as needed for mild pain, fever or headache. ?  ?aluminum-magnesium hydroxide-simethicone 200-200-20 MG/5ML Susp ?Commonly known as: MAALOX ?Take 30 mLs by mouth every 6 (six) hours as needed (heartburn/indigestion). ?  ?bisacodyl 10 MG suppository ?Commonly known as: DULCOLAX ?Place 10 mg rectally See admin instructions. 1 suppository every 3 days as needed for constipation ?  ?busPIRone 7.5 MG tablet ?Commonly known as: BUSPAR ?Take 7.5 mg by mouth 2 (two) times daily. ?  ?guaiFENesin 100 MG/5ML liquid ?Commonly known as: ROBITUSSIN ?Take 10 mLs by mouth every 6 (six) hours as needed for cough or to loosen phlegm. ?  ?levothyroxine 75 MCG tablet ?Commonly known as: SYNTHROID ?Take 75 mcg by mouth daily. ?  ?loperamide 2 MG capsule ?Commonly known as: IMODIUM ?Take 2 mg  by mouth as needed for diarrhea or loose stools. ?  ?magnesium  hydroxide 400 MG/5ML suspension ?Commonly known as: MILK OF MAGNESIA ?Take 30 mLs by mouth at bedtime as needed for mild constipation. ?  ?melatonin 3 MG Tabs tablet ?Take 3 mg by mouth at bedtime. ?  ?mirtazapine 15 MG tablet ?Commonly known as: REMERON ?Take 15 mg by mouth at bedtime. ?  ?OLANZapine 2.5 MG tablet ?Commonly known as: ZYPREXA ?Take 2.5 mg by mouth at bedtime. ?  ?traZODone 100 MG tablet ?Commonly known as: DESYREL ?Take 100 mg by mouth at bedtime. ?  ?TRIPLE ANTIBIOTIC EX ?Apply 1 application. topically daily as needed (minor skin tears or abrasions). ?  ? ?  ? ? ? ? Follow-up Information   ? ? Surgery, [place]. Call.   ?Specialty: General Surgery ?Why: As needed ?Contact information: ?[address] ?[address] ?[place] [address] ?[phone] ? ? ?  ?  ? ?  ?  ? ?  ? ? ?Signed: ?Winferd Humphrey , PA-C ?[facility] ?01/29/2022, 12:08 PM ?Please see Amion for pager number during day hours 7:00am-4:30pm ? ? ? ?

## 2022-01-29 NOTE — Progress Notes (Signed)
I spoke with patient's son Alexis Cortez via phone this morning to discuss decision to transition to comfort care measures. I reviewed her surgical issues, primarily her slow progress poor and minimal oral intake. I discussed that if patient were to want to continue with maximum medical management, I would recommend placement of a feeding tube to optimize nutrition and promote wound healing and further recovery. Alexis Cortez reported that Alexis Cortez has expressed that she does not want a feeding tube and wants to focus on quality time at this point. He felt reassured that they are making the best decision for her by transitioning to hospice care. Patient is to discharge to a hospice facility today. Appreciate assistance from palliative care. ?

## 2022-01-29 NOTE — Progress Notes (Signed)
? ?  Referral received from Miranda late yesterday afternoon with TOC and request that we not follow up until today. I have have spoken tot he pt son Trey Paula- We discussed the transition to the Hospice home in [facility] and what this would entail. He is in agreement and feels this is the best solution for his mother at this time. We do have a bed to offer today and we can accept pt today. The pt was approved by our MD for hospice care at [facility] in [facility].  ?The son will sign paperwork by doc-u-sign agreement and once received back we will be able to set up transport for first available.  ? ?Hezzie Bump RN [phone]  ?

## 2022-01-29 NOTE — Progress Notes (Signed)
? ? ?  13 Days Post-Op  ?Subjective: ?No acute changes, patient is hemodynamically stable. Had family meeting with palliative care yesterday, and now transitioning to comfort care measures. Patient is sleeping comfortably this morning. ? ?Objective: ?Vital signs in last 24 hours: ?Temp:  [98.6 ?F (37 ?C)-98.8 ?F (37.1 ?C)] 98.7 ?F (37.1 ?C) (04/14 0039) ?Pulse Rate:  [57-73] 57 (04/14 0503) ?Resp:  [15-24] 15 (04/14 0503) ?BP: (112-151)/(42-61) 112/42 (04/14 0503) ?SpO2:  [96 %-100 %] 97 % (04/14 0503) ?Last BM Date : 01/28/22 ? ?Intake/Output from previous day: ?04/13 0701 - 04/14 0700 ?In: 1358.1 [P.O.:240; I.V.:1118.1] ?Out: 1835 [Urine:1110; Stool:725] ?Intake/Output this shift: ?No intake/output data recorded. ? ?PE: ?Gen:  sleeping ?Heart: RRR ?Lungs: normal work of breathing ?Abd: soft, nondistended, colostomy productive of stool. Midline incision is open at skin with dressing in place. ? ? ? ? ? ?Lab Results:  ?Recent Labs  ?  01/27/22 ?0130 01/28/22 ?0719  ?WBC 9.9 10.6*  ?HGB 7.7* 8.7*  ?HCT 23.9* 26.8*  ?PLT 376 509*  ? ?BMET ?Recent Labs  ?  01/27/22 ?0922  ?NA 138  ?K 4.3  ?CL 112*  ?CO2 22  ?GLUCOSE 88  ?BUN 22  ?CREATININE 0.61  ?CALCIUM 7.3*  ? ?PT/INR ?No results for input(s): LABPROT, INR in the last 72 hours. ?CMP  ?   ?Component Value Date/Time  ? NA 138 01/27/2022 0922  ? K 4.3 01/27/2022 0922  ? CL 112 (H) 01/27/2022 2119  ? CO2 22 01/27/2022 0922  ? GLUCOSE 88 01/27/2022 0922  ? BUN 22 01/27/2022 0922  ? CREATININE 0.61 01/27/2022 0922  ? CALCIUM 7.3 (L) 01/27/2022 4174  ? PROT 3.7 (L) 01/22/2022 0143  ? ALBUMIN <1.5 (L) 01/22/2022 0143  ? AST 67 (H) 01/22/2022 0143  ? ALT 62 (H) 01/22/2022 0143  ? ALKPHOS 202 (H) 01/22/2022 0143  ? BILITOT 0.5 01/22/2022 0143  ? GFRNONAA >60 01/27/2022 0922  ? GFRAA >60 10/10/2019 0307  ? ?Lipase  ?   ?Component Value Date/Time  ? LIPASE 37 01/15/2022 1704  ? ? ?Studies/Results: ?No results found. ? ? ? ?Assessment/Plan ?POD 13, s/p  ex lap with Hartmann's  procedure by Dr. Dwain Sarna 01/16/22 for stercoral ulcer perforation ?- Patient has had slow progress with continued poor appetite and minimal PO intake. After discussions with family and palliative care, she has elected to focus on comfort measures. Referral to residential hospice pending. ?- Continue local wound care: mepitel to base of midline incision, cover with saline WTD, change WTD BID. ?- Pain control and symptom management, appreciate assistance from palliative care. ? ? ? LOS: 14 days  ? ? ?Fritzi Mandes, MD ?[facility] ?01/29/2022, 7:57 AM ?Please see Amion for pager number during day hours 7:00am-4:30pm ? ?

## 2022-01-29 NOTE — Consult Note (Addendum)
WOC consult requested  to provide topical treatment recommendations for palliative care team for the abd wound. Performed remotely after review of the progress notes. ? ?Surgical team is following for assessment and plan of care.  They have ordered the following:  ?Continue local wound care: mepitel to base of midline incision, cover with saline WTD, change WTD BID. ?Please refer to surgical team for further questions regarding plan of care. ? ?Please re-consult if further assistance is needed.  Thank-you,  ?Cammie Mcgee MSN, RN, CWOCN, [place], CNS ?[phone]  ?

## 2022-01-29 NOTE — Progress Notes (Signed)
Patient discharge to [facility] [place] [facility]  Son, [name], to follow [facility] transportation. ? ?All belongings were provided to son.   ? ?Telemetry discontinued and CCMD aware. ? ?IV in R forearm was left in per [facility] request Arby Barrette, nurse).   ? ?AVS provided to [facility]. ?

## 2022-02-23 NOTE — Progress Notes (Shared)
?[facility] Note ? ?03/02/2022 ? ?  ? ?CHIEF COMPLAINT ?Patient presents for No chief complaint on file. ? ?HISTORY OF PRESENT ILLNESS: ?Alexis Cortez is a 88 y.o. female who presents to the clinic today for:  ? ? ? ? ?Referring physician: ?Jettie Booze, NP ?[address] ?[place],  [place] [address] ? ?HISTORICAL INFORMATION:  ?Selected notes from the [place] ?Referred by [place], [place] for concern of exu ARMD ?LEE: 02.06.20 (Shil K. Patel) [BCVA: OD: 20/30+2 OS: 20/25 ?Ocular Hx-PCO OS, exu ARMD OD, non-exu ARMD OS, s/p YAG cap OD, s/p IVE OD 02.26.20  ?PMH-HTN, depression, anxiety  ? ?CURRENT MEDICATIONS: ?No current outpatient medications on file. (Ophthalmic Drugs)  ? ?No current facility-administered medications for this visit. (Ophthalmic Drugs)  ? ?Current Outpatient Medications (Other)  ?Medication Sig  ? acetaminophen (TYLENOL) 500 MG tablet Take 500 mg by mouth every 6 (six) hours as needed for mild pain, fever or headache.  ? aluminum-magnesium hydroxide-simethicone (MAALOX) I037812 MG/5ML SUSP Take 30 mLs by mouth every 6 (six) hours as needed (heartburn/indigestion).  ? bisacodyl (DULCOLAX) 10 MG suppository Place 10 mg rectally See admin instructions. 1 suppository every 3 days as needed for constipation  ? busPIRone (BUSPAR) 7.5 MG tablet Take 7.5 mg by mouth 2 (two) times daily.   ? guaiFENesin (ROBITUSSIN) 100 MG/5ML liquid Take 10 mLs by mouth every 6 (six) hours as needed for cough or to loosen phlegm.  ? levothyroxine (SYNTHROID) 75 MCG tablet Take 75 mcg by mouth daily.  ? loperamide (IMODIUM) 2 MG capsule Take 2 mg by mouth as needed for diarrhea or loose stools.  ? magnesium hydroxide (MILK OF MAGNESIA) 400 MG/5ML suspension Take 30 mLs by mouth at bedtime as needed for mild constipation.  ? melatonin 3 MG TABS tablet Take 3 mg by mouth at bedtime.  ? mirtazapine (REMERON) 15 MG tablet Take 15 mg by mouth at bedtime.   ?  Neomycin-Bacitracin-Polymyxin (TRIPLE ANTIBIOTIC EX) Apply 1 application. topically daily as needed (minor skin tears or abrasions).  ? OLANZapine (ZYPREXA) 2.5 MG tablet Take 2.5 mg by mouth at bedtime.  ? traZODone (DESYREL) 100 MG tablet Take 100 mg by mouth at bedtime.   ? ?No current facility-administered medications for this visit. (Other)  ? ?REVIEW OF SYSTEMS: ? ? ?ALLERGIES ?No Known Allergies ? ?PAST MEDICAL HISTORY ?Past Medical History:  ?Diagnosis Date  ? Hypertension   ? Hypertensive retinopathy   ? OU  ? Macular degeneration   ? Wet OD, Dry OS  ? ?Past Surgical History:  ?Procedure Laterality Date  ? CATARACT EXTRACTION Bilateral   ? COLECTOMY N/A 01/16/2022  ? Procedure: SIGMOID COLECTOMY;  Surgeon: Rolm Bookbinder, MD;  Location: [facility];  Service: General;  Laterality: N/A;  ? COLOSTOMY  01/16/2022  ? Procedure: COLOSTOMY;  Surgeon: Rolm Bookbinder, MD;  Location: [facility];  Service: General;;  ? EYE SURGERY    ? YAG LASER APPLICATION Right   ? ?FAMILY HISTORY ?No family history on file. ? ?SOCIAL HISTORY ?Social History  ? ?Tobacco Use  ? Smoking status: Never  ? Smokeless tobacco: Never  ?Vaping Use  ? Vaping Use: Never used  ?Substance Use Topics  ? Alcohol use: Yes  ?  Alcohol/week: 2.0 standard drinks  ?  Types: 2 Standard drinks or equivalent per week  ? Drug use: Never  ?  ? ?  ?OPHTHALMIC EXAM: ? ?Not recorded ?  ? ?  IMAGING AND PROCEDURES  ?Imaging and Procedures for @TODAY @ ? ? ?  ?  ? ?  ?ASSESSMENT/PLAN: ?  ICD-10-CM   ?1. Exudative age-related macular degeneration of both eyes with active choroidal neovascularization ([place])  H35.3231   ?  ?2. Essential hypertension  I10   ?  ?3. Hypertensive retinopathy of both eyes  H35.033   ?  ?4. Pseudophakia of both eyes  Z96.1   ?  ?5. Left posterior capsular opacification  H26.492   ?  ? ? ? ?1. Exudative age related macular degeneration, both eyes ? - interval conversion to exu ARMD OS on exam and OCT on 09.12.22 ? - previous pt of Dr. Georges Lynch at  [facility] in [place], [place] (last injection was IVE on 02.26.20, and was on a 7-8 injection interval) -- pt living in [place] temporarily with family ? - S/P IVE #1 OD (07.08.20), #2 (08.19.20), # 3 (10.14.20), #4 (02.15.21), #4 (04.28.21), #5 (09.17.21), #6 (09.12.22), #7 (10.10.22), #8 (11.21.22), #9 (02.13.23) ?            - S/P IVE OS #1 (09.12.22), #2 (10.10.22), #3 (11.21.23), #4 (02.13.23) ? - OCT shows OD: interval improvement in IRF overlying central SRHM/CNV; OS: interval improvement in [facility] and SRF overlying central PED at 6 wks ? - BCVA 20/40 OU (stable) ? - recommend IVE OU (OD #10 and OS #5) today, 05.16.23 w/ ext to 7 wks ? - pt wishes to proceed ? - RBA of procedure discussed, questions answered ? - informed consent obtained ? [name] informed consent form signed and scanned on 09.12.22 (OU) ? - [employer] benefits investigation started on April 25, 2019 -- approved for 2021 ? - see procedure note ? - f/u in 7 wks -- DFE/OCT/possible injection ? ?2,3. Hypertensive retinopathy OU ? - discussed importance of tight BP control ? - monitor ? ?4,5. Pseudophakia OU ? - s/p CE/IOL OU ? - s/p YAG cap OD ? - beautiful surgeries, doing well ? - OS with mild noncentral PCO ? - monitor ? ?Ophthalmic Meds Ordered this visit:  ?No orders of the defined types were placed in this encounter. ?  ? ?No follow-ups on file. ? ?There are no Patient Instructions on file for this visit. ? ?This document serves as a record of services personally performed by Gardiner Sleeper, MD, PhD. It was created on their behalf by Annie Paras, COT, an ophthalmic technician. The creation of this record is the provider's dictation and/or activities during the visit.   ? ?Electronically signed by: Annie Paras, COT 02/23/22 8:58 AM  ? ?Gardiner Sleeper, M.D., Ph.D. ?Diseases & Surgery of the Retina and Vitreous ?[place] ? ? ? ? ?Abbreviations: ?M myopia (nearsighted); A astigmatism; H  hyperopia (farsighted); P presbyopia; Mrx spectacle prescription;  CTL contact lenses; OD right eye; OS left eye; OU both eyes  XT exotropia; ET esotropia; PEK punctate epithelial keratitis; PEE punctate epithelial erosions; DES dry eye syndrome; MGD meibomian gland dysfunction; ATs artificial tears; PFAT's preservative free artificial tears; [place] nuclear sclerotic cataract; PSC posterior subcapsular cataract; ERM epi-retinal membrane; PVD posterior vitreous detachment; RD retinal detachment; DM diabetes mellitus; DR diabetic retinopathy; NPDR non-proliferative diabetic retinopathy; PDR proliferative diabetic retinopathy; CSME clinically significant macular edema; DME diabetic macular edema; dbh dot blot hemorrhages; CWS cotton wool spot; POAG primary open angle glaucoma; C/D cup-to-disc ratio; HVF humphrey visual field; GVF goldmann visual field; OCT optical coherence tomography; IOP intraocular  pressure; BRVO Branch retinal vein occlusion; CRVO central retinal vein occlusion; CRAO central retinal artery occlusion; BRAO branch retinal artery occlusion; RT retinal tear; SB scleral buckle; PPV pars plana vitrectomy; VH Vitreous hemorrhage; PRP panretinal laser photocoagulation; IVK intravitreal kenalog; VMT vitreomacular traction; MH Macular hole;  NVD neovascularization of the disc; NVE neovascularization elsewhere; AREDS age related eye disease study; ARMD age related macular degeneration; POAG primary open angle glaucoma; EBMD epithelial/anterior basement membrane dystrophy; ACIOL anterior chamber intraocular lens; IOL intraocular lens; PCIOL posterior chamber intraocular lens; Phaco/IOL phacoemulsification with intraocular lens placement; [facility] photorefractive keratectomy; LASIK laser assisted in situ keratomileusis; HTN hypertension; DM diabetes mellitus; COPD chronic obstructive pulmonary disease ? ?

## 2022-03-02 ENCOUNTER — Encounter (INDEPENDENT_AMBULATORY_CARE_PROVIDER_SITE_OTHER): Payer: Medicare PPO | Admitting: Ophthalmology

## 2022-03-02 DIAGNOSIS — Z961 Presence of intraocular lens: Secondary | ICD-10-CM

## 2022-03-02 DIAGNOSIS — I10 Essential (primary) hypertension: Secondary | ICD-10-CM

## 2022-03-02 DIAGNOSIS — H35033 Hypertensive retinopathy, bilateral: Secondary | ICD-10-CM

## 2022-03-02 DIAGNOSIS — H353231 Exudative age-related macular degeneration, bilateral, with active choroidal neovascularization: Secondary | ICD-10-CM

## 2022-03-02 DIAGNOSIS — H26492 Other secondary cataract, left eye: Secondary | ICD-10-CM

## 2022-09-17 DEATH — deceased

## 2023-02-15 IMAGING — CT CT ABD-PELV W/O CM
2 of 4 series · 14 of 46 positions shown, 16 images · non-contrast
Comparison: AP chest 10/05/2019

CLINICAL DATA: Left lower quadrant abdominal pain.



[Series 3: a/p w/o 5mm (person_name) · axial · non-contrast · 0.98mm/px · z∈[+719,+1149]mm · 11 of 100 slices shown, 13 images]
[im 9/100  soft-tissue]
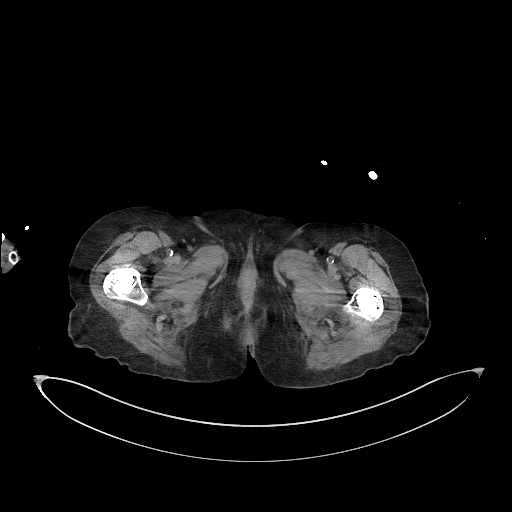
[im 9/100  bone]
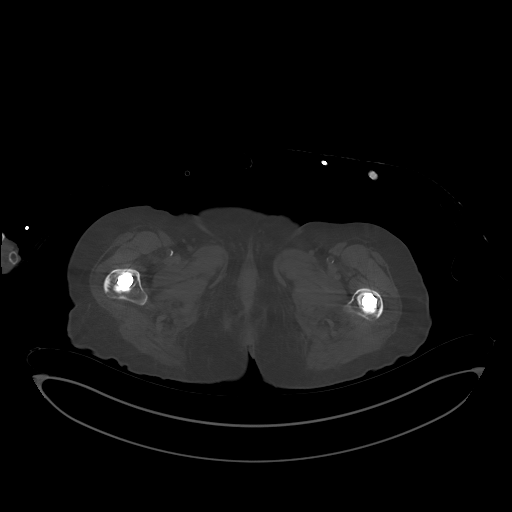
[im 18/100  soft-tissue]
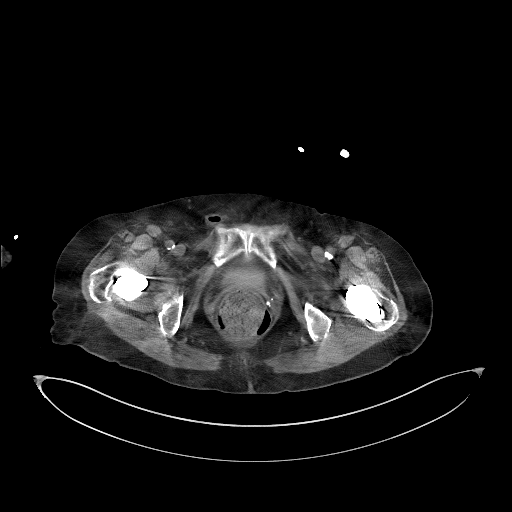
[im 26/100  soft-tissue]
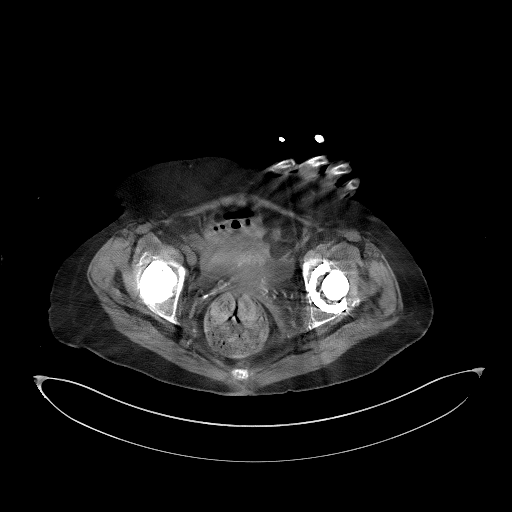
[im 35/100  soft-tissue]
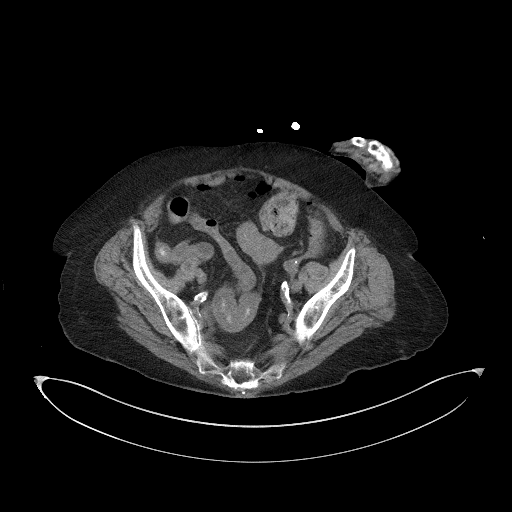
[im 44/100  soft-tissue]
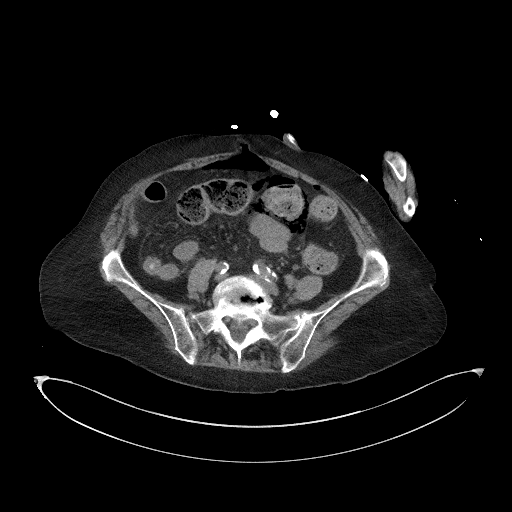
[im 52/100  soft-tissue]
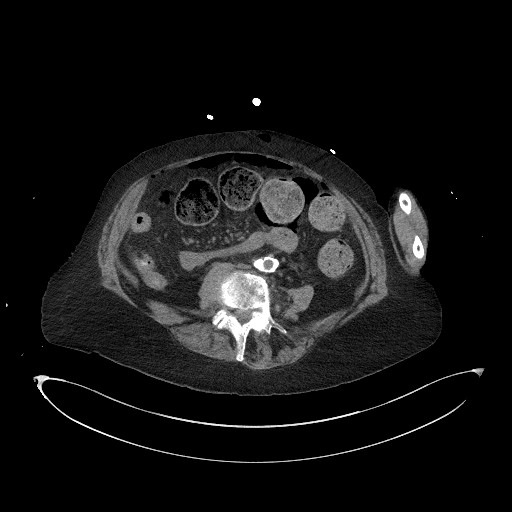
[im 61/100  soft-tissue]
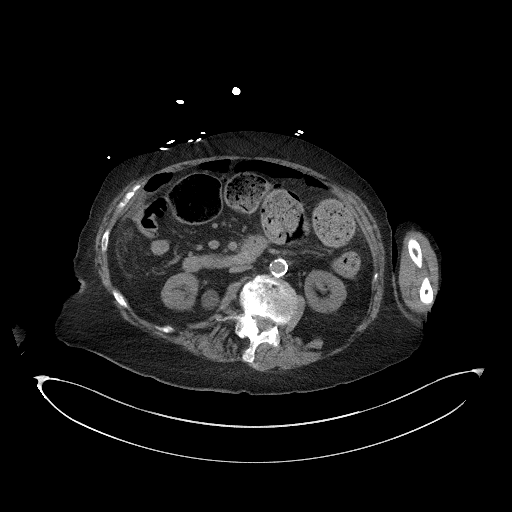
[im 69/100  soft-tissue]
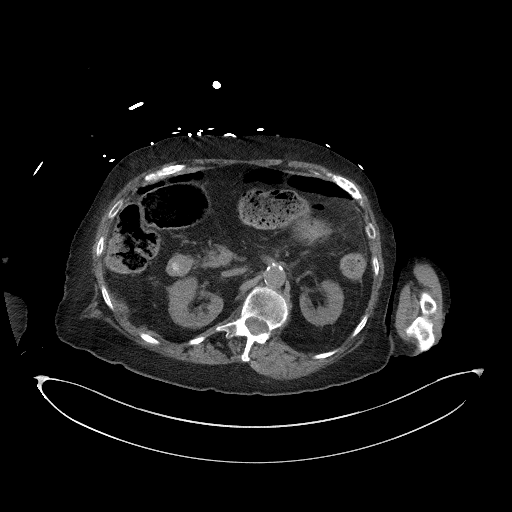
[im 78/100  soft-tissue]
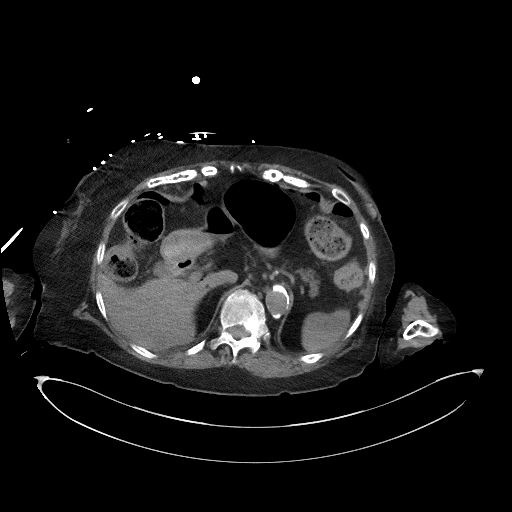
[im 78/100  bone]
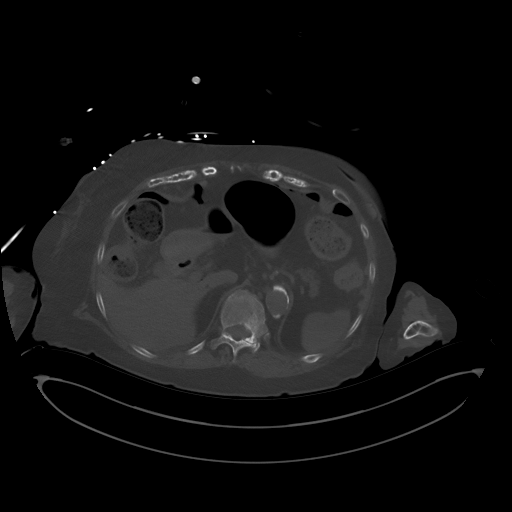
[im 87/100  soft-tissue]
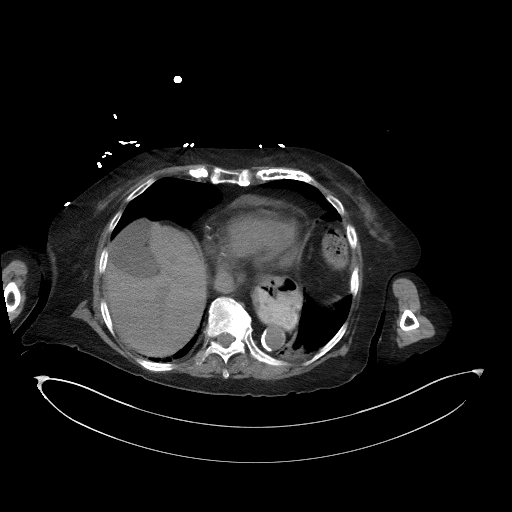
[im 95/100  soft-tissue]
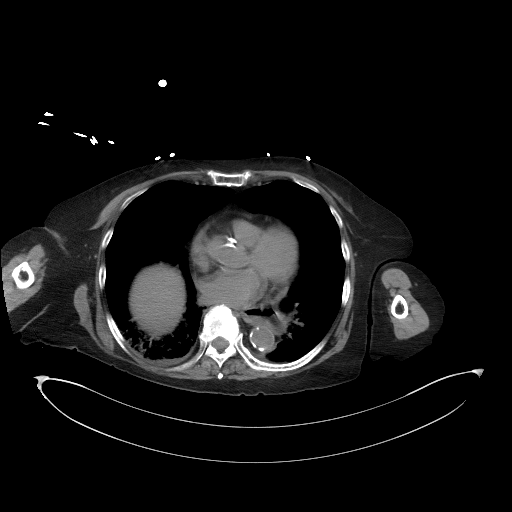

[Series 6: a/p w/o cor · coronal · non-contrast · 0.89mm/px · 3 of 151 slices shown]
[im 51/151  soft-tissue]
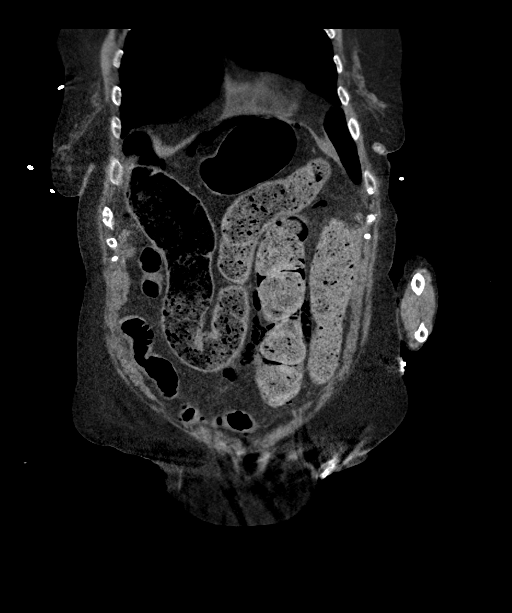
[im 67/151  soft-tissue]
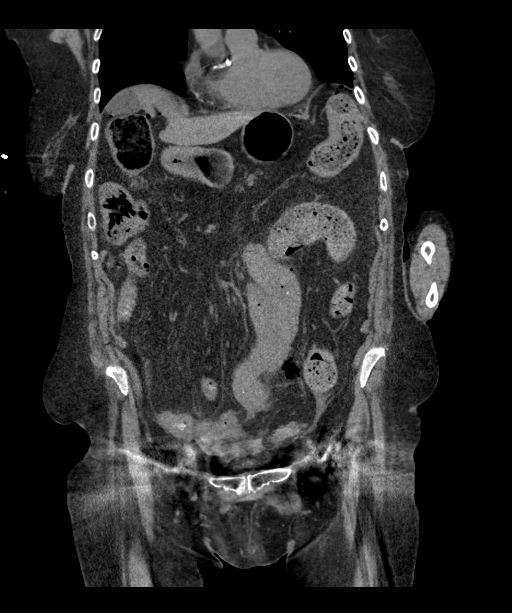
[im 84/151  soft-tissue]
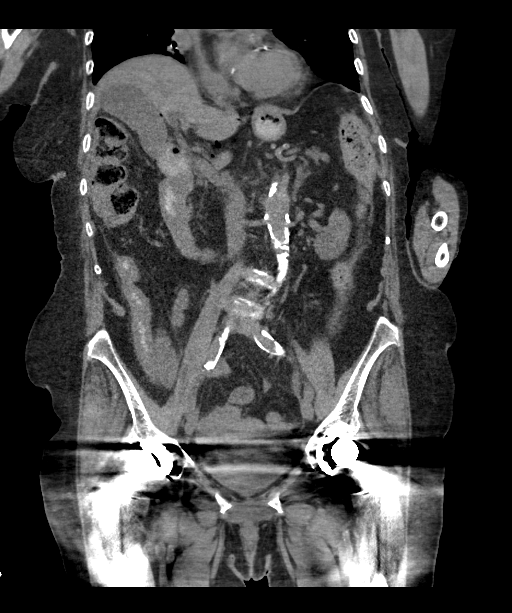

[14 of 46 positions shown; findings below may reference images not displayed]

FINDINGS: Lower chest: Small right and possible trace left pleural effusions.
Moderate right and mild left associated curvilinear ground-glass
posterior lung base densities. Mild cardiomegaly. Mild pericardial
fluid.

Unfortunately this study is mildly to moderately limited by patient
motion artifact.

Lack of intra-articular fluid further limits evaluation of the
abdominal and pelvic organ parenchyma. The following findings are
made within this limitation.

Hepatobiliary: Smooth liver contours. No gross liver lesion is seen.
The gallbladder is grossly unremarkable.

Pancreas: There is moderate atrophy and fatty infiltration of the
pancreas within normal limits for patient age.

Spleen: Normal in size without focal abnormality.

Adrenals/Urinary Tract: Normal adrenals. No renal stone or
hydronephrosis. There is a cyst that is medial to the inferior pole
of the right kidney that appears to represent a parapelvic cyst
measuring up to 2.3 cm. Within limitations of streak artifact from
bilateral total hip arthroplasty hardware no gross abnormality is
seen within the urinary bladder.

Stomach/Bowel: Moderate stool is seen within the rectum. There is
moderate sigmoid diverticulosis. High-grade stool throughout all
segments of the colon. There is air within the lumen around
high-grade stool within the proximal sigmoid colon (axial images 42
through 54) and moderate to high-grade air also seen surrounding the
colon in this region suspicious for pneumatosis and
microperforation. There is also more distant ventral peritoneal free
air.

The terminal ileum is unremarkable. The appendix is not confidently
identified, however no definite inflammatory changes are seen around
the cecum to indicate secondary signs of acute appendicitis.
Mild-to-moderate sliding hiatal hernia. Mild thickening of the wall
of the second and third portions of the duodenum may be secondary to
inflammatory change versus underdistention.

Vascular/Lymphatic: High-grade atherosclerotic calcifications. No
abdominal aortic aneurysm. No abdominopelvic lymphadenopathy.

Reproductive: Not well evaluated due to lack of IV contrast, motion
artifact and metallic streak artifact.

Other: No free fluid is seen. There is moderate pneumoperitoneum
seen ventral to the right upper quadrant left upper quadrant and
midline posterior abdomen. There are scattered gas bubbles anterior
to the ileum in the superior right hemipelvis (axial image 70). A
small focus of air extends into the right inguinal canal (axial
image 83). 1 there is also an apparent fat containing midline
ventral abdominal hernia with orifice measuring up to 1.7 x 1.1 cm
(transverse by AP, axial image 44 and sagittal image 96) which
contains mild ventral herniated fat and a small amount of air.

Musculoskeletal: Moderate to severe levocurvature centered at L2-3
with severe multilevel degenerative disc and endplate changes.
Chronic mild-to-moderate L1 vertebral body height loss. Status post
bilateral total hip arthroplasty.
IMPRESSION: 1. There is moderate pneumoperitoneum. There is mild sigmoid
diverticulosis with air tracking circumferentially around the
proximal sigmoid colon wall concerning for wall micro rupture in
this region. There appears to be extra colonic free air around an
approximate 7 cm length of the sigmoid colon and there may be tiny
foci of pneumatosis in this region however that is indeterminate.

2. There is high-grade stool throughout the colon and the rectum.

3. Small right and possible trace left pleural effusions with
associated mild bibasilar lung opacities.

4. Mild thickening of the wall of the second and third portions of
the duodenum may be secondary to inflammatory change versus
underdistention.

Critical Value/emergent results were called by telephone at the time
of interpretation on 01/15/2022 at [DATE] to provider PERVEZ MARINELLI
, who verbally acknowledged these results.

## 2023-02-16 IMAGING — DX DG ABDOMEN 1V
1 series · 1 of 1 positions shown · non-contrast
Comparison: None.

CLINICAL DATA: OG tube placement

EXAM:
ABDOMEN - 1 VIEW

[abdomen]
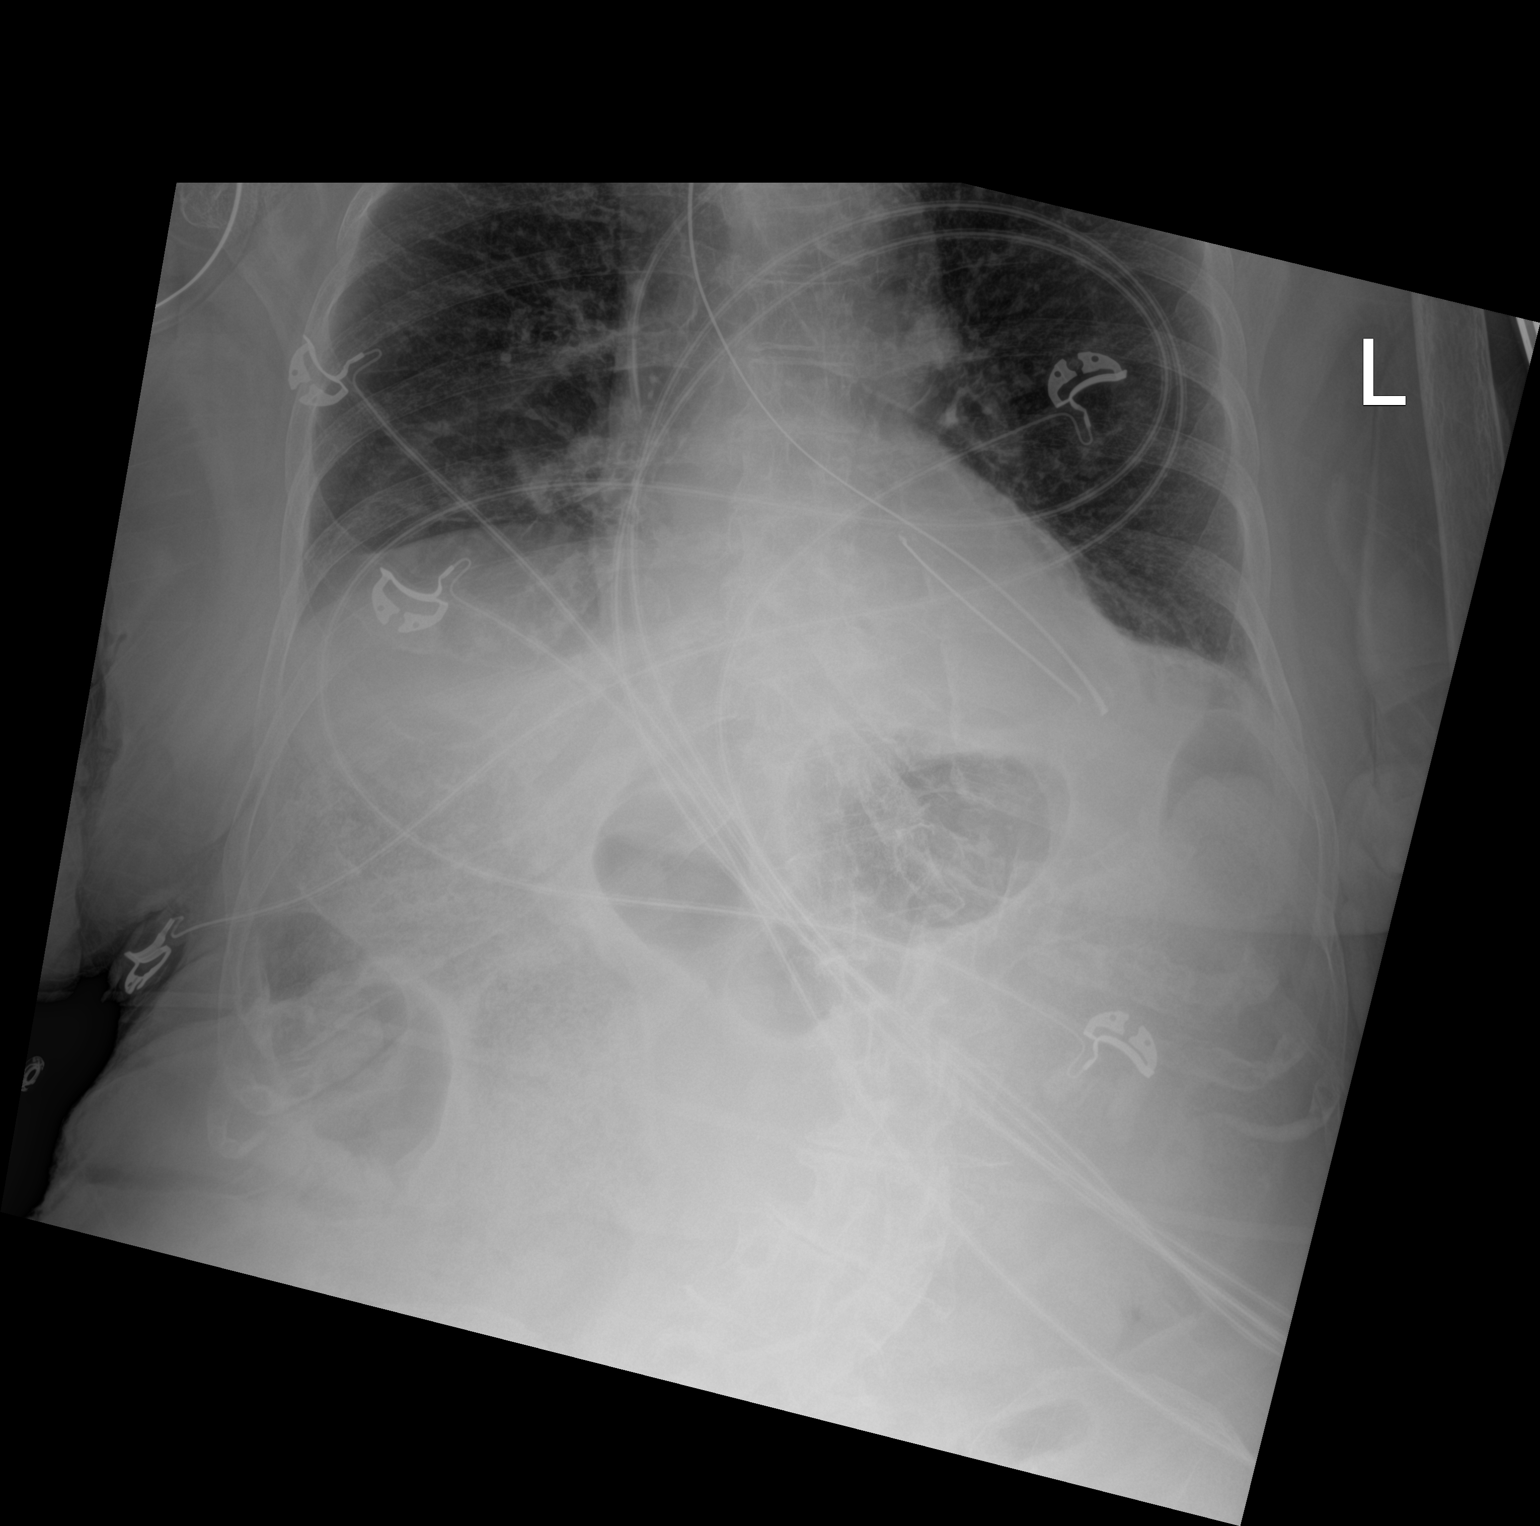

[1 of 1 positions shown; findings below may reference images not displayed]

FINDINGS: NG tube loops on itself in the distal esophagus. The tip is in the
mid to distal thoracic esophagus.
IMPRESSION: As above.

## 2023-02-16 IMAGING — DX DG CHEST 1V PORT
1 series · 1 of 1 positions shown · non-contrast
Comparison: 10/05/2019

CLINICAL DATA: Postop for pneumoperitoneum.

EXAM:
PORTABLE CHEST 1 VIEW

[chest ap]
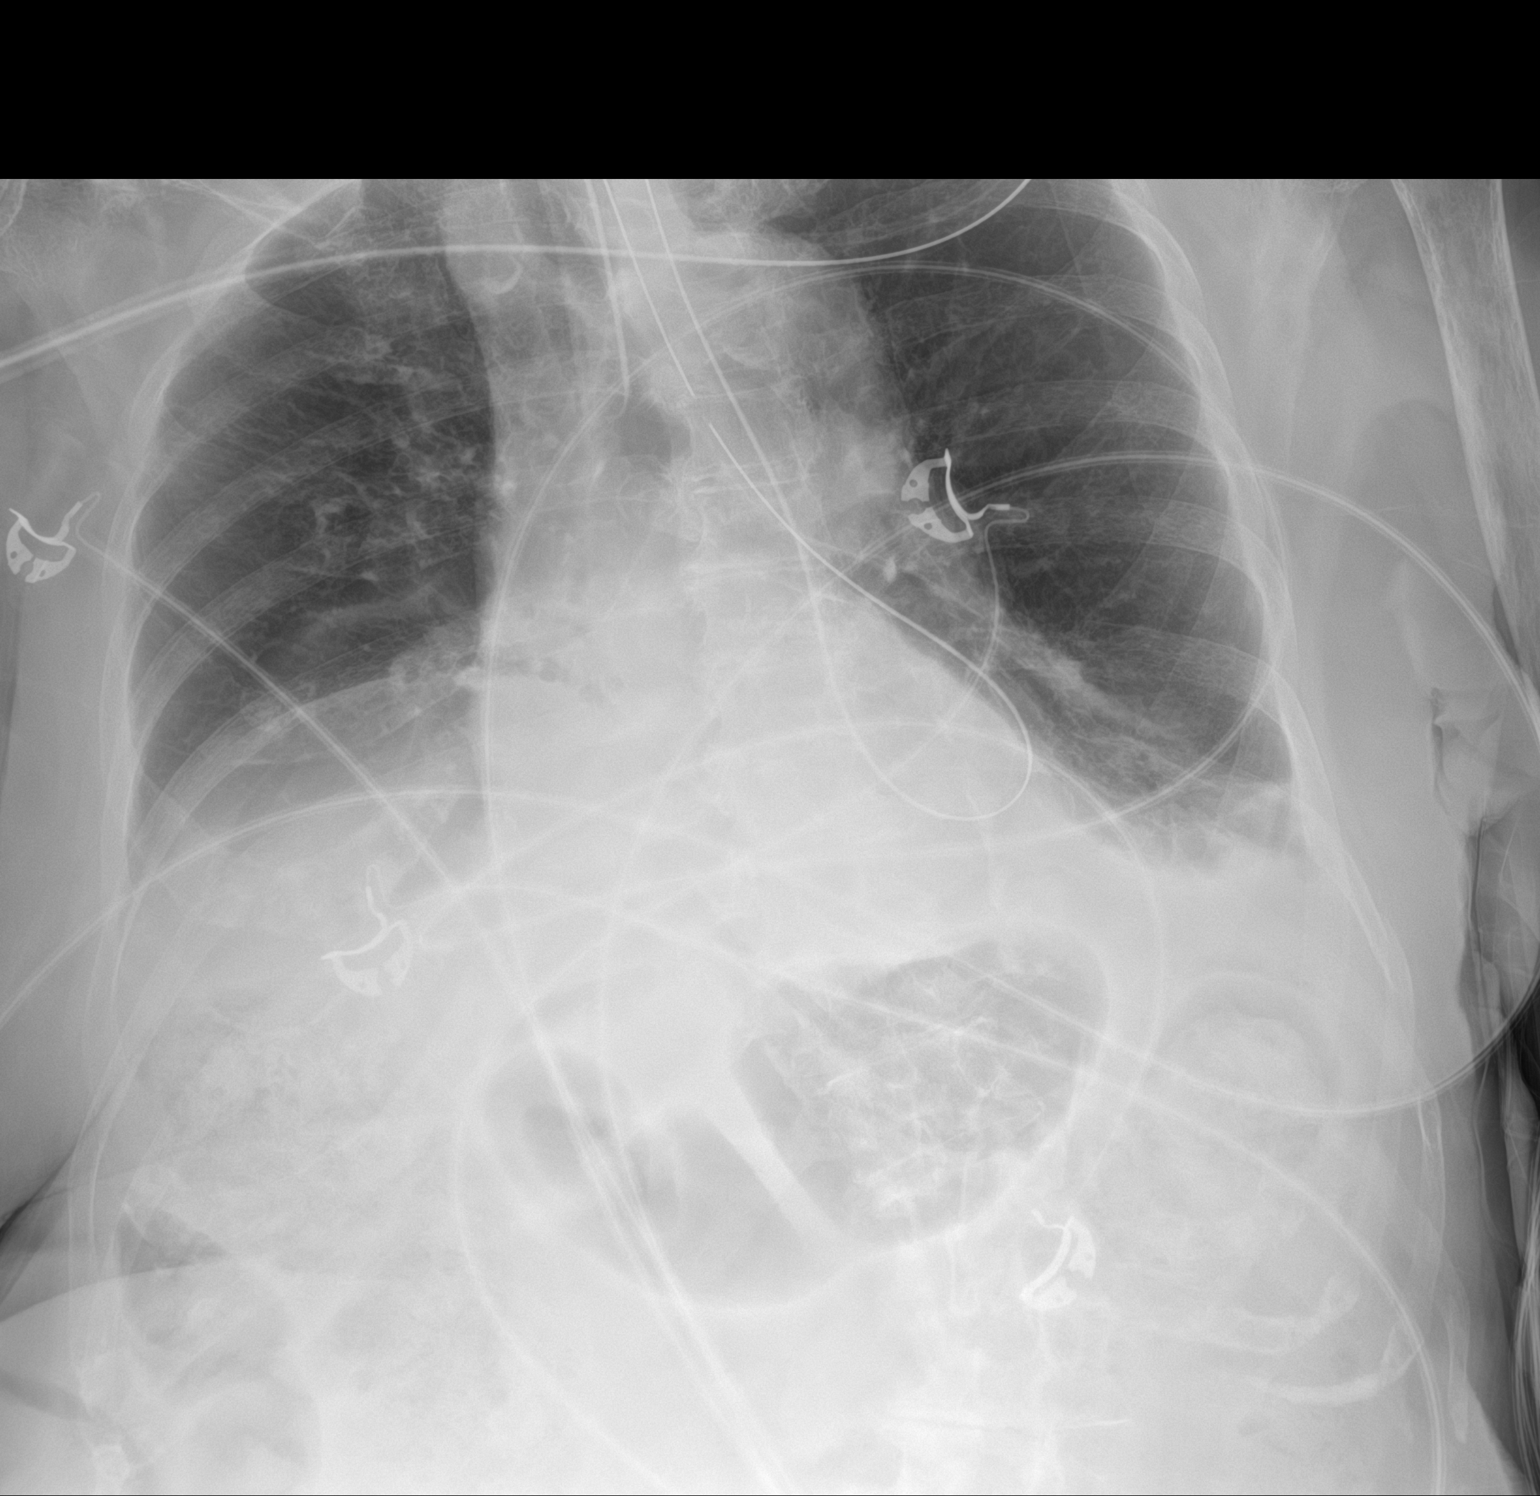

[1 of 1 positions shown; findings below may reference images not displayed]

FINDINGS: Endotracheal tube tip is 2.3 cm above the base of the carina. The NG
tube is folded on itself in the distal esophagus with the tip
directed cranially at the level of the thoracic inlet. Bibasilar
collapse/consolidation evident with small bilateral pleural
effusions.
IMPRESSION: 1. Endotracheal tube tip 2.3 cm above the base of the carina.
2. NG tube folded on itself in the distal esophagus with the tip
directed cranially at the level of the thoracic inlet.

I discussed these findings by telephone with patient's nurse,

## 2023-02-16 IMAGING — DX DG ABD PORTABLE 1V
1 series · 1 of 1 positions shown · non-contrast
Comparison: Abdomen/pelvis CT 01/15/2022

CLINICAL DATA: Pneumoperitoneum.

EXAM:
PORTABLE ABDOMEN - 1 VIEW

[abdomen supine]
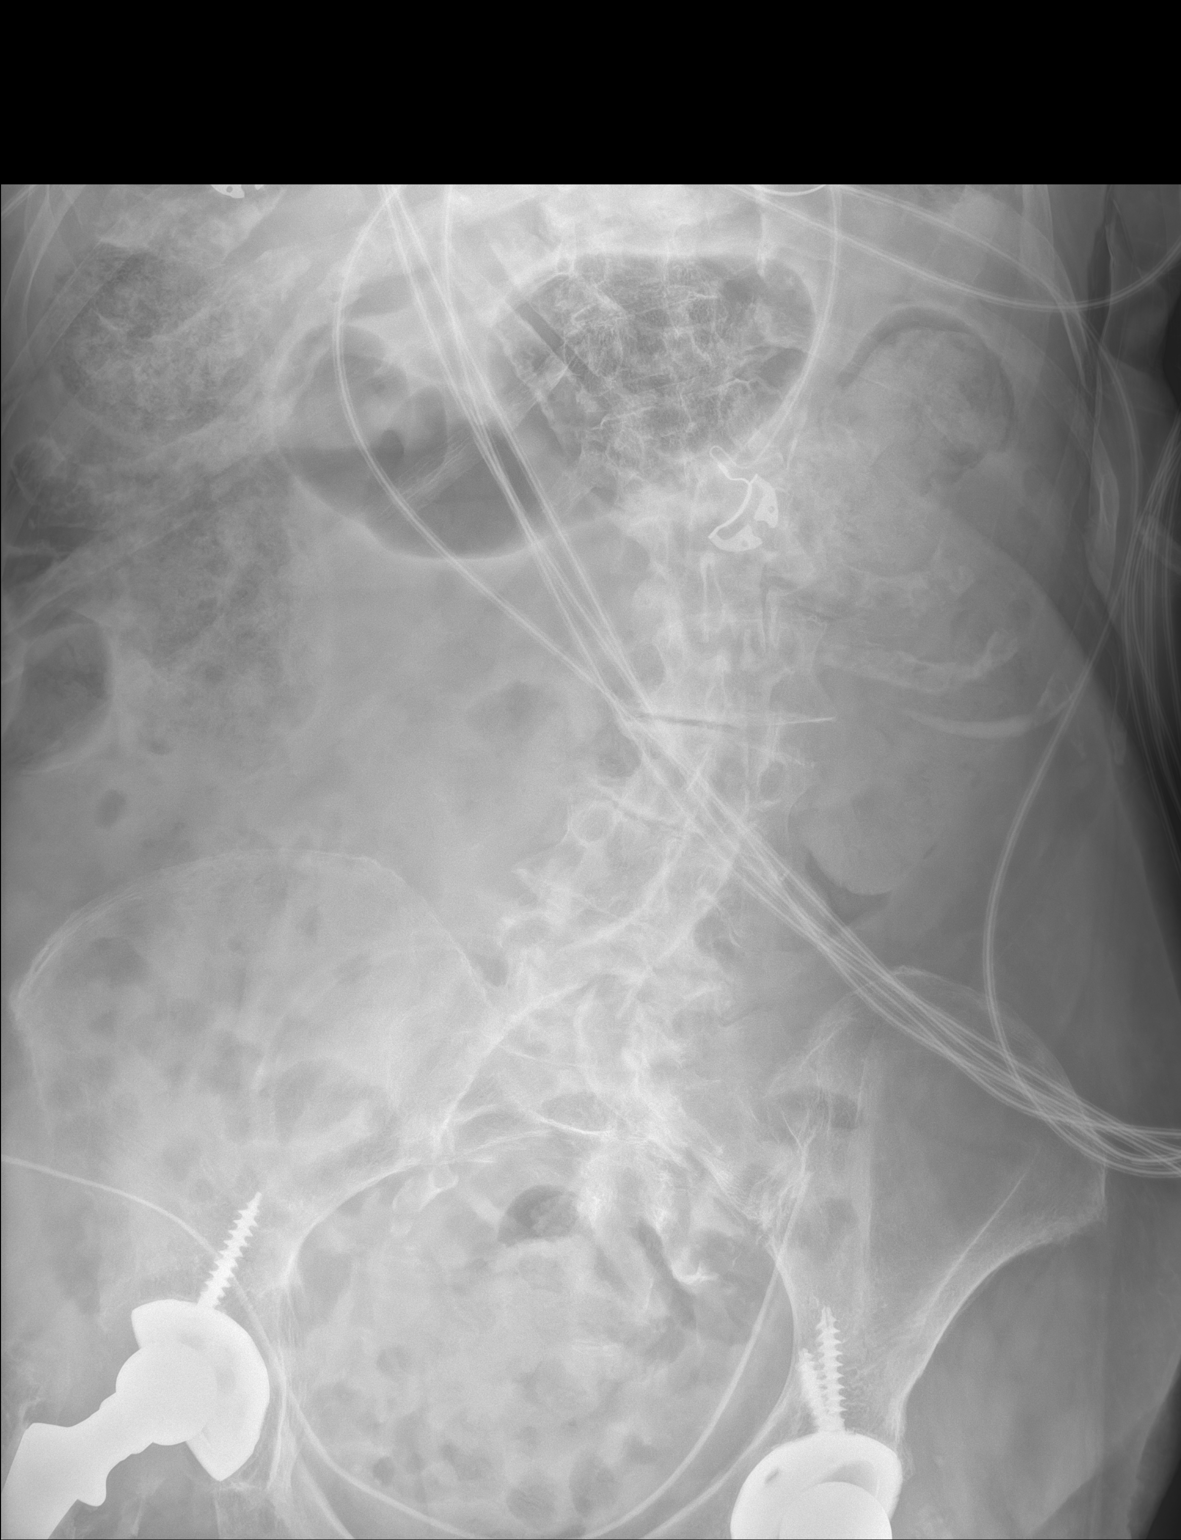

[1 of 1 positions shown; findings below may reference images not displayed]

FINDINGS: No gaseous small bowel dilatation. Prominent stool volume noted
throughout the colon. NG tube is looped in the distal esophagus with
the tip directed cranially but not included on the film.
IMPRESSION: 1. NG tube is looped in the distal esophagus with the tip directed
cranially but not included on the film.
2. Prominent colonic stool volume.

## 2023-02-16 IMAGING — DX DG CHEST 1V PORT
1 series · 1 of 1 positions shown · non-contrast
Comparison: None.

CLINICAL DATA: OG tube and central line placement

EXAM:
PORTABLE CHEST 1 VIEW

[chest]
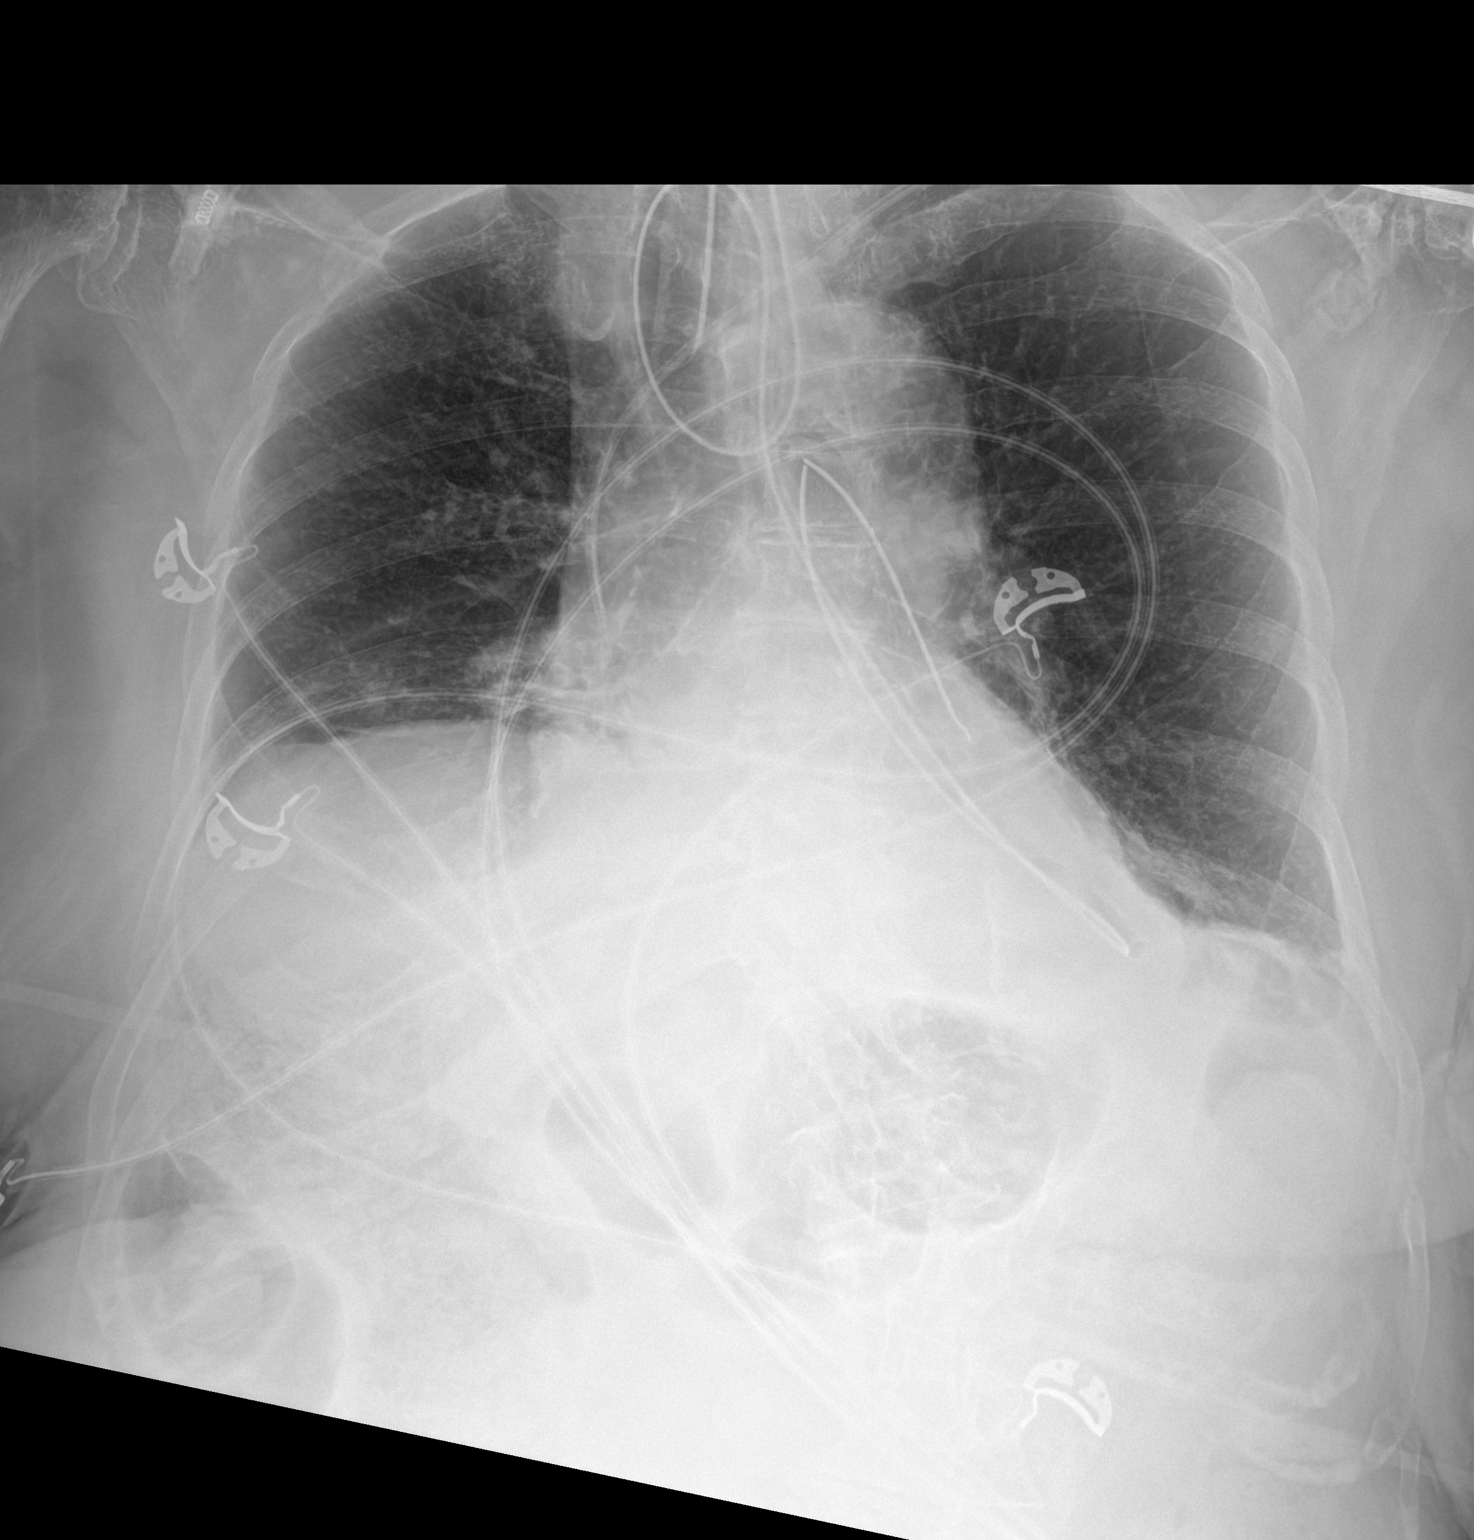

[1 of 1 positions shown; findings below may reference images not displayed]

FINDINGS: Endotracheal tube has been retracted, now 4.5 cm from the carina.
Left subclavian central line tip at at the cavoatrial junction. No
pneumothorax. NG tube folded on itself twice passing as far as the
distal esophagus. The tip is in the midthoracic esophagus. This
likely needs be removed completely and replaced to reduce the loops
in the NG tube.

Bibasilar atelectasis. No effusions. Heart is normal size. No acute
bony abnormality.
IMPRESSION: NG tube loops twice on itself in the esophagus with the tip in the
midthoracic esophagus. This likely needs to be removed completely to
reduce the loops, then replaced.

Left central line tip at the cavoatrial junction.  No pneumothorax.

Bibasilar atelectasis.
